# Patient Record
Sex: Male | Born: 1940 | Race: White | Hispanic: No | Smoking: Former smoker
Health system: Southern US, Community
[De-identification: ages and names within clinical notes are randomized; demographics above are authoritative.]

## PROBLEM LIST (undated history)

## (undated) DIAGNOSIS — I10 Essential (primary) hypertension: Secondary | ICD-10-CM

## (undated) DIAGNOSIS — N185 Chronic kidney disease, stage 5: Secondary | ICD-10-CM

## (undated) DIAGNOSIS — G4730 Sleep apnea, unspecified: Secondary | ICD-10-CM

## (undated) DIAGNOSIS — G459 Transient cerebral ischemic attack, unspecified: Secondary | ICD-10-CM

## (undated) DIAGNOSIS — E785 Hyperlipidemia, unspecified: Secondary | ICD-10-CM

## (undated) DIAGNOSIS — R410 Disorientation, unspecified: Secondary | ICD-10-CM

## (undated) DIAGNOSIS — F05 Delirium due to known physiological condition: Secondary | ICD-10-CM

## (undated) DIAGNOSIS — Z8489 Family history of other specified conditions: Secondary | ICD-10-CM

## (undated) DIAGNOSIS — N400 Enlarged prostate without lower urinary tract symptoms: Secondary | ICD-10-CM

## (undated) DIAGNOSIS — N189 Chronic kidney disease, unspecified: Secondary | ICD-10-CM

## (undated) DIAGNOSIS — M1990 Unspecified osteoarthritis, unspecified site: Secondary | ICD-10-CM

## (undated) DIAGNOSIS — F0390 Unspecified dementia without behavioral disturbance: Secondary | ICD-10-CM

## (undated) HISTORY — DX: Transient cerebral ischemic attack, unspecified: G45.9

## (undated) HISTORY — PX: CERVICAL DISC SURGERY: SHX588

---

## 1998-06-25 ENCOUNTER — Ambulatory Visit (HOSPITAL_BASED_OUTPATIENT_CLINIC_OR_DEPARTMENT_OTHER): Admission: RE | Admit: 1998-06-25 | Discharge: 1998-06-25 | Payer: Self-pay | Admitting: Surgery

## 2008-03-05 ENCOUNTER — Encounter (INDEPENDENT_AMBULATORY_CARE_PROVIDER_SITE_OTHER): Payer: Self-pay | Admitting: *Deleted

## 2008-03-07 ENCOUNTER — Ambulatory Visit (HOSPITAL_COMMUNITY): Admission: RE | Admit: 2008-03-07 | Discharge: 2008-03-07 | Payer: Self-pay | Admitting: Neurosurgery

## 2008-05-12 ENCOUNTER — Inpatient Hospital Stay (HOSPITAL_COMMUNITY): Admission: RE | Admit: 2008-05-12 | Discharge: 2008-05-16 | Payer: Self-pay | Admitting: Neurosurgery

## 2008-05-12 HISTORY — PX: BACK SURGERY: SHX140

## 2009-01-28 ENCOUNTER — Telehealth: Payer: Self-pay | Admitting: Gastroenterology

## 2010-02-16 NOTE — Progress Notes (Signed)
Summary: Schedule Colonoscopy  Phone Note Outgoing Call Call back at [facility] Phone [phone]   Call placed by: Bernita Buffy CMA Deborra Medina),  January 28, 2009 4:01 PM Call placed to: Patient Summary of Call: Left message on patients machine to call back.  Initial call taken by: Bernita Buffy CMA Deborra Medina),  January 28, 2009 4:01 PM  Follow-up for Phone Call        Left message on patients machine to call back.  Follow-up by: Bernita Buffy CMA Deborra Medina),  February 11, 2009 8:59 AM  Additional Follow-up for Phone Call Additional follow up Details #1::        Left message on patients machine to call back.  Additional Follow-up by: Bernita Buffy CMA ([place]),  February 18, 2009 3:59 PM

## 2010-04-28 LAB — BASIC METABOLIC PANEL
BUN: 20 mg/dL (ref 6–23)
BUN: 22 mg/dL (ref 6–23)
CO2: 24 mEq/L (ref 19–32)
CO2: 28 mEq/L (ref 19–32)
Chloride: 104 mEq/L (ref 96–112)
Chloride: 107 mEq/L (ref 96–112)
GFR calc Af Amer: >60 mL/min (ref >60)
GFR calc non Af Amer: 60 mL/min — ABNORMAL LOW (ref >60)
Glucose, Bld: 95 mg/dL (ref 70–99)
Potassium: 3.6 mEq/L (ref 3.5–5.1)
Potassium: 4.2 mEq/L (ref 3.5–5.1)
Sodium: 143 mEq/L (ref 135–145)

## 2010-04-28 LAB — DIFFERENTIAL
Basophils Absolute: 0 10*3/uL (ref 0.0–0.1)
Basophils Absolute: 0 10*3/uL (ref 0.0–0.1)
Basophils Relative: 0 % (ref 0–1)
Eosinophils Absolute: 0.3 10*3/uL (ref 0.0–0.7)
Eosinophils Relative: 4 % (ref 0–5)
Lymphocytes Relative: 11 % — ABNORMAL LOW (ref 12–46)
Lymphocytes Relative: 21 % (ref 12–46)
Lymphs Abs: 1.1 10*3/uL (ref 0.7–4.0)
Monocytes Absolute: 0.8 10*3/uL (ref 0.1–1.0)
Neutrophils Relative %: 75 % (ref 43–77)

## 2010-04-28 LAB — CBC
HCT: 26 % — ABNORMAL LOW (ref 39.0–52.0)
HCT: 31.3 % — ABNORMAL LOW (ref 39.0–52.0)
HCT: 40.4 % (ref 39.0–52.0)
Hemoglobin: 14.2 g/dL (ref 13.0–17.0)
Hemoglobin: 9.1 g/dL — ABNORMAL LOW (ref 13.0–17.0)
MCHC: 35 g/dL (ref 30.0–36.0)
MCHC: 35.1 g/dL (ref 30.0–36.0)
MCHC: 35.4 g/dL (ref 30.0–36.0)
MCV: 86.5 fL (ref 78.0–100.0)
MCV: 87.1 fL (ref 78.0–100.0)
MCV: 87.1 fL (ref 78.0–100.0)
Platelets: 140 10*3/uL — ABNORMAL LOW (ref 150–400)
Platelets: 178 10*3/uL (ref 150–400)
Platelets: 225 10*3/uL (ref 150–400)
RBC: 3.59 MIL/uL — ABNORMAL LOW (ref 4.22–5.81)
RDW: 12.5 % (ref 11.5–15.5)
RDW: 13.3 % (ref 11.5–15.5)
WBC: 10.7 10*3/uL — ABNORMAL HIGH (ref 4.0–10.5)
WBC: 9.9 10*3/uL (ref 4.0–10.5)

## 2010-04-28 LAB — URINALYSIS, ROUTINE W REFLEX MICROSCOPIC
Glucose, UA: NEGATIVE mg/dL
Specific Gravity, Urine: 1.016 (ref 1.005–1.030)
pH: 5 (ref 5.0–8.0)

## 2010-04-28 LAB — URINE MICROSCOPIC-ADD ON

## 2010-04-28 LAB — CULTURE, BLOOD (ROUTINE X 2): Culture: NO GROWTH

## 2010-04-28 LAB — ABO/RH: ABO/RH(D): A POS

## 2010-06-01 NOTE — Op Note (Signed)
NAME:  Chase Evans, Chase Evans NO.:  [id number]   MEDICAL RECORD NO.:  [id number]          PATIENT TYPE:  INP   LOCATION:  3022                         FACILITY:  [place]   PHYSICIAN:  [name], M.D.DATE OF BIRTH:  1940/06/03   DATE OF PROCEDURE:  05/12/2008  DATE OF DISCHARGE:                               OPERATIVE REPORT   BRIEF HISTORY:  The patient is a 70 year old white male who has suffered  from back and leg pain consistent with neurogenic claudication.  He has  failed medical management, worked up with a lumbar MRI, which  demonstrated the patient had spondylolisthesis and stenosis at L2-3, 3-  4, 4-5.  I discussed various treatment with the patient including  surgery.  The patient has weighed the risks, benefits, and alternatives  of surgery and decided to proceed with a lumbar decompression,  instrumentation, and fusion.   PREOPERATIVE DIAGNOSES:  L2-3, L3-4, L4-5 spondylolisthesis, spinal  stenosis, degeneration of lumbar radiculopathy lumbago.   POSTOPERATIVE DIAGNOSES:  L2-3, L3-4, L4-5 spondylolisthesis, spinal  stenosis, degeneration of lumbar radiculopathy lumbago.   PROCEDURE:  Bilateral L2, L3, and L4 laminotomies and foraminotomies to  decompress the bilateral L2, L3, and L4 nerve roots (what need to  perform is was an excessive, what was required to perform posterior  lumbar fusion); L2-3, L3-4, L4-5 posterior lumbar interbody fusion with  local morselized autograft and fused bone graft extender; insertion at  L2-3, L4-5, and L5-S1 interbody prosthesis (Capstone PEEK interbody  prosthesis); L2-L5 posterior segmental instrumentation with legacy  titanium pedicle screw rods; L2-3, L3-4, L4-5 posterolateral arthrodesis  with local morselized autograft bone and Vitoss bone graft extender.   SURGEON:  [name], MD   ASSISTANT:  Otilio Connors, MD   ANESTHESIA:  General endotracheal.   ESTIMATED BLOOD LOSS:  400 mL.   SPECIMENS:   None.   DRAINS:  None.   COMPLICATIONS:  None.   DESCRIPTION OF PROCEDURE:  The patient was brought to the operating room  by the anesthesia team.  General endotracheal anesthesia was induced.  The patient was carefully turned to the prone position on the Wilson  frame.  His lumbosacral region was then shaved with clippers and  prepared with Betadine scrub and Betadine solution.  Sterile drapes were  applied.  I then injected the area to be incised with Marcaine with  epinephrine solution, used a scalpel to make a linear midline incision  over the L2-3, 3-4, 4-5 interspace.  We then used electrocautery to  perform bilateral subperiosteal dissections exposing spinous process  lamina of L1, 2, 3, 4, and 5.  We obtained intraoperative radiograph to  confirm our location.  We then inserted the first retractor for  exposure.   Because of the patient's severe stenosis, facet arthropathy,  spondylosis, fixed foraminal stenosis, etc, a wide lateral decompression  was required in excess of that needed to do posterior lumbar interbody  fusion.  We used a high-speed drill to perform bilateral L2, L3, and L4  laminotomies.  We widened the laminotomies with Kerrison punch.  We then  removed the L2-3, 3-4, 4-5  ligamentum flavum.   We then performed wide foraminotomies about the bilateral L2, 3, 4, and  5 nerve roots.  We then palpated along the exit route of the nerve roots  and noted them  well decompressed throughout the neural foramen.  Having  completed decompression, we now turned our attention to arthrodesis.  We  incised the L2-3, 3-4, 4-5 intervertebral disk bilaterally with the #15  blade scalpel and we performed a partial intervertebral dissection with  pituitary forceps and a curette.  We prepared the vertebral endplates  for fusion by using curettes to clear soft tissue from the vertebral  endplates.  We then used trial spacers and determined using 12/26 lumbar  interbody prosthesis  at each level bilaterally.  We then prefilled these  prosthesis with a combination of local morselized autograft bone we  obtained during the decompression as well as active fused bone graft  extender.  We then inserted the prosthesis into the interspaces at L2-3,  3-4, 4-5 bilaterally.  We filled the remainder of the disk space with  combination of active fused and local morselized autograft bone, which  completed the posterior lumbar interbody fusion.   We now turned attention to the instrumentation.  Under fluoroscopic  guidance, we cannulated the bilateral L2, 3, 4, and 5 pedicles with bone  probe.  We tapped pedicles with a 6.5 mm tap.  We then probed inside the  tapped pedicles to look for cortical breaches; we inserted 7.5 x 55 mm  screws bilaterally at L2-3, 4, and 5 under fluoroscopic guidance.  We  then palpated along the medial aspect of the bilateral L2, 3, 4, and 5  pedicles to rule out cortical breeches.  There were none.  We then  connected unilateral pedicle screws with lordotic rod.  We compressed  the construct and secured rod in place with the capsule, which was  tightened appropriately.  This completed the instrumentation.   We now turned our attention to posterolateral arthrodesis.  We used a  high-speed drill to decorticate the remainder of the L2-3, 3-4, and 4-5  facets, pars, and transverse processes.  We laid a combination of local  morselized autograft bone and Vitoss bone graft extenders over these  decorticated posterolateral structures.  This completed the  posterolateral arthrodesis.   We then inspected the thecal sac at L2, 3, 4, and 5 nerve roots and  neural structure well decompressed.  We obtained hemostasis using  bipolar cautery.  We irrigated the wound out with bacitracin solution  and then removed the retractors.  We then reapproximated the patient's  thoracolumbar fascia with interrupted #1 Vicryl suture, subcutaneous  using 2-0 Vicryl suture, and  skin with Steri-Strips and Benzoin.  The  wound was then coated with bacitracin ointment.  Sterile dressing was  applied.  The drapes were removed.  The patient was subsequently  returned to supine position where he was extubated by the anesthesia  team and transported to the post anesthesia care unit in stable  condition.  All sponge, instrument, and needle counts were correct at  the end of this case.      [name], M.D.  Electronically Signed     [name], M.D.  Electronically Signed   JDJ/MEDQ  D:  05/12/2008  T:  05/13/2008  Job:  [id number]

## 2010-06-04 NOTE — Discharge Summary (Signed)
NAME:  Chase Evans, Chase Evans NO.:  [id number]   MEDICAL RECORD NO.:  [id number]          PATIENT TYPE:  INP   LOCATION:  3022                         FACILITY:  [place]   PHYSICIAN:  [name], M.D.DATE OF BIRTH:  12/29/1940   DATE OF ADMISSION:  05/12/2008  DATE OF DISCHARGE:  05/16/2008                               DISCHARGE SUMMARY   BRIEF HISTORY:  The patient is a 70 year old white male who has suffered  from back and leg pain.  He failed medical management and worked up with  a lumbar myelo CT, which demonstrated the patient had spondylolisthesis  and stenosis at L2-3, L3-4, L4-5.  I discussed various treatment options  with the patient including surgery.  The patient has weighed the risks,  benefits, and alternatives of surgery and decided to proceed with a  lumbar decompression, instrumentation, and fusion.   For further details of this admission, please refer to typed history and  physical.   HOSPITAL COURSE:  Admitted the patient to [facility] on May 12, 2008.  On the day of admission, I performed L2-3, L3-4, and L4-5  decompression, instrumentation, and fusion.  The surgery went well (for  full details of this operation, please refer to typed operative note).   POSTOPERATIVE COURSE:  The patient's postoperative course was remarkable  only for a low-grade fever.  We checked a chest x-ray, it turned out he  had some atelectasis.  His urinalysis and blood cultures were negative.  The patient by postop day #4 was afebrile, vital signs were stable, and  he was eating well, ambulating well, and was requesting discharge to  home, was therefore discharged to home on May 16, 2008.   DISCHARGE INSTRUCTIONS:  The patient was given written discharge  instructions.  Instructed to follow up with me in 4 weeks.   DISCHARGE PRESCRIPTIONS:  1. Percocet 10/325 #100 one p.o. q.4 h. p.r.n. for pain.  2. Valium 5 mg #50 one p.o. q.6 h. p.r.n. for muscle  spasms.   FINAL DIAGNOSES:  L2-3, L3-4, L4-5 degenerative spondylolisthesis,  spinal stenosis, lumbar radiculopathy, lumbago, blood loss anemia, and  urinary retention.   PROCEDURE PERFORMED:  Decompressive laminectomy at L2, L3, L4 with L2-3,  L3-4, L4-5 posterior lumbar interbody fusion and placement of the  interbody prosthesis (Capstone PEEK interbody prosthesis) posterolateral  arthrodesis at L2-3, L3-4, L4-5 and a posterior segmental  instrumentation at L2-L5 with Legacy titanium pedicle screws and rods.      [name], M.D.  Electronically Signed     [name], M.D.  Electronically Signed    JDJ/MEDQ  D:  05/29/2008  T:  05/30/2008  Job:  [id number]

## 2011-08-02 ENCOUNTER — Encounter: Payer: Self-pay | Admitting: Internal Medicine

## 2011-09-06 ENCOUNTER — Other Ambulatory Visit: Payer: Self-pay | Admitting: Neurosurgery

## 2011-09-06 ENCOUNTER — Other Ambulatory Visit (HOSPITAL_COMMUNITY): Payer: Self-pay | Admitting: Neurosurgery

## 2011-09-06 DIAGNOSIS — M48061 Spinal stenosis, lumbar region without neurogenic claudication: Secondary | ICD-10-CM

## 2011-09-13 ENCOUNTER — Ambulatory Visit (HOSPITAL_COMMUNITY)
Admission: RE | Admit: 2011-09-13 | Discharge: 2011-09-13 | Disposition: A | Discharge status: Home / Self Care | Payer: Medicare Other | Source: Ambulatory Visit | Attending: Neurosurgery | Admitting: Neurosurgery

## 2011-09-13 DIAGNOSIS — M48061 Spinal stenosis, lumbar region without neurogenic claudication: Secondary | ICD-10-CM | POA: Insufficient documentation

## 2011-09-13 DIAGNOSIS — Z981 Arthrodesis status: Secondary | ICD-10-CM | POA: Insufficient documentation

## 2011-09-13 DIAGNOSIS — M25559 Pain in unspecified hip: Secondary | ICD-10-CM | POA: Insufficient documentation

## 2011-09-13 DIAGNOSIS — M549 Dorsalgia, unspecified: Secondary | ICD-10-CM | POA: Insufficient documentation

## 2011-09-13 MED ORDER — IOHEXOL 180 MG/ML  SOLN
20.0000 mL | Freq: Once | INTRAMUSCULAR | Status: AC | PRN
  Administered 2011-09-13: 20 mL via INTRATHECAL

## 2011-09-13 MED ORDER — DIAZEPAM 5 MG PO TABS
10.0000 mg | ORAL_TABLET | Freq: Once | ORAL | Status: AC
  Administered 2011-09-13: 10 mg via ORAL

## 2011-09-13 MED ORDER — OXYCODONE-ACETAMINOPHEN 5-325 MG PO TABS
ORAL_TABLET | ORAL | Status: AC
  Filled 2011-09-13: qty 2

## 2011-09-13 MED ORDER — MORPHINE SULFATE 4 MG/ML IJ SOLN: 2.0000 mg | INTRAMUSCULAR | Status: DC | PRN

## 2011-09-13 MED ORDER — OXYCODONE-ACETAMINOPHEN 5-325 MG PO TABS
1.0000 | ORAL_TABLET | ORAL | Status: DC | PRN
  Administered 2011-09-13: 2 via ORAL

## 2011-09-13 MED ORDER — DIAZEPAM 5 MG PO TABS
ORAL_TABLET | ORAL | Status: AC
  Administered 2011-09-13: 10 mg via ORAL
  Filled 2011-09-13: qty 2

## 2011-09-13 MED ORDER — ONDANSETRON HCL 4 MG/2ML IJ SOLN: 4.0000 mg | Freq: Four times a day (QID) | INTRAMUSCULAR | Status: DC | PRN

## 2011-09-13 MED ORDER — DIAZEPAM 5 MG PO TABS: 10.0000 mg | ORAL_TABLET | Freq: Once | ORAL | Status: DC

## 2011-09-13 MED ORDER — OXYCODONE HCL 5 MG PO TABS
ORAL_TABLET | ORAL | Status: AC
  Filled 2011-09-13: qty 2

## 2011-09-13 NOTE — Op Note (Signed)
Brief history: The patient complains of back pain.  Procedure: Lumbar myelogram  Surgeon: Dr. Earle Gell  Asst.: None  Level injected:L4/5  Dye injected: 15 cc of Omnipaque 180  CSF appearance: Clear  Estimated blood loss minimal  Complications: None

## 2011-09-13 NOTE — H&P (Signed)
Subjective: The patient is a 71 year old white male who I previously performed a lumbar decompression and fusion on. The patient has developed recurrent back buttocks and leg pain consistent with neurogenic claudication. I discussed working up further with a lumbar mild CT. The patient has weighed the risks, benefits, and alternatives to surgery and decided proceed with that study.   No past medical history on file.  No past surgical history on file.  Allergies  Allergen Reactions  . Penicillins Hives    History  Substance Use Topics  . Smoking status: Not on file  . Smokeless tobacco: Not on file  . Alcohol Use: Not on file    No family history on file. Prior to Admission medications   Medication Sig Start Date End Date Taking? Authorizing Provider  aspirin 81 MG tablet Take 81 mg by mouth daily.   Yes Historical Provider, MD  gabapentin (NEURONTIN) 800 MG tablet Take 800 mg by mouth 3 (three) times daily.   Yes Historical Provider, MD  ibuprofen (ADVIL,MOTRIN) 200 MG tablet Take 400 mg by mouth every 8 (eight) hours as needed. For pain   Yes Historical Provider, MD  rosuvastatin (CRESTOR) 10 MG tablet Take 10 mg by mouth daily.   Yes Historical Provider, MD  valsartan-hydrochlorothiazide (DIOVAN-HCT) 160-12.5 MG per tablet Take 1 tablet by mouth daily.   Yes Historical Provider, MD     Review of Systems  Positive ROS: As above  All other systems have been reviewed and were otherwise negative with the exception of those mentioned in the HPI and as above.  Objective: Vital signs in last 24 hours: Temp:  [97.7 F (36.5 C)] 97.7 F (36.5 C) (08/27 0609) Pulse Rate:  [75] 75  (08/27 0609) Resp:  [18] 18  (08/27 0609) BP: (143)/(78) 143/78 mmHg (08/27 0609) SpO2:  [97 %] 97 % (08/27 0609) Weight:  [94.348 kg (208 lb)] 94.348 kg (208 lb) (08/27 0609)  General Appearance: Alert, cooperative, no distress, appears stated age Head: Normocephalic, without obvious abnormality,  atraumatic Eyes: PERRL, conjunctiva/corneas clear, EOM's intact, fundi benign, both eyes      Ears: Normal TM's and external ear canals, both ears Throat: Lips, mucosa, and tongue normal; teeth and gums normal Neck: Supple, symmetrical, trachea midline, no adenopathy; thyroid: No enlargement/tenderness/nodules; no carotid bruit or JVD Back: Symmetric, no curvature, ROM normal, no CVA tenderness the patient's lumbar incision is well-healed Lungs: Clear to auscultation bilaterally, respirations unlabored Heart: Regular rate and rhythm, S1 and S2 normal, no murmur, rub or gallop Abdomen: Soft, non-tender, bowel sounds active all four quadrants, no masses, no organomegaly Extremities: Extremities normal, atraumatic, no cyanosis or edema Pulses: 2+ and symmetric all extremities Skin: Skin color, texture, turgor normal, no rashes or lesions  NEUROLOGIC:   Mental status: alert and oriented, no aphasia, good attention span, Fund of knowledge/ memory ok Motor Exam - grossly normal Sensory Exam - grossly normal Reflexes:  Coordination - grossly normal Gait - grossly normal Balance - grossly normal Cranial Nerves: I: smell Not tested  II: visual acuity  OS: Normal    OD: Normal   II: visual fields Full to confrontation  II: pupils Equal, round, reactive to light  III,VII: ptosis None  III,IV,VI: extraocular muscles  Full ROM  V: mastication Normal  V: facial light touch sensation  Normal  V,VII: corneal reflex  Present  VII: facial muscle function - upper  Normal  VII: facial muscle function - lower Normal  VIII: hearing Not tested  IX:  soft palate elevation  Normal  IX,X: gag reflex Present  XI: trapezius strength  5/5  XI: sternocleidomastoid strength 5/5  XI: neck flexion strength  5/5  XII: tongue strength  Normal    Data Review Lab Results  Component Value Date   WBC 7.5 05/16/2008   HGB 9.1* 05/16/2008   HCT 26.0* 05/16/2008   MCV 87.4 05/16/2008   PLT 178 05/16/2008   Lab  Results  Component Value Date   NA 135 05/13/2008   K 3.6 05/13/2008   CL 104 05/13/2008   CO2 24 05/13/2008   BUN 20 05/13/2008   CREATININE 1.26 05/13/2008   GLUCOSE 108* 05/13/2008   No results found for this basename: INR, PROTIME    Assessment/Plan: Lumbago, lumbar discopathy, neurogenic claudication: I discussed situation with the patient. We have discussed working up him up further with a lumbar mild CT. The patient has weighed the risks, benefits, and alternatives to this study and decided to proceed with the study.   Chase Evans D 09/13/2011 7:36 AM

## 2011-09-14 ENCOUNTER — Telehealth (HOSPITAL_COMMUNITY): Payer: Self-pay | Admitting: *Deleted

## 2011-09-14 NOTE — Telephone Encounter (Signed)
Post myelo phone call attempted.  Message left.

## 2011-09-29 ENCOUNTER — Other Ambulatory Visit: Payer: Self-pay | Admitting: Neurosurgery

## 2011-10-18 ENCOUNTER — Encounter (HOSPITAL_COMMUNITY): Payer: Self-pay | Admitting: *Deleted

## 2011-10-18 ENCOUNTER — Encounter (HOSPITAL_COMMUNITY): Payer: Self-pay | Admitting: Pharmacy Technician

## 2011-10-18 MED ORDER — VANCOMYCIN HCL IN DEXTROSE 1-5 GM/200ML-% IV SOLN
1000.0000 mg | INTRAVENOUS | Status: AC
  Administered 2011-10-19: 1000 mg via INTRAVENOUS
  Filled 2011-10-18: qty 200

## 2011-10-19 ENCOUNTER — Inpatient Hospital Stay (HOSPITAL_COMMUNITY): Payer: Medicare Other

## 2011-10-19 ENCOUNTER — Inpatient Hospital Stay (HOSPITAL_COMMUNITY)
Admission: RE | Admit: 2011-10-19 | Discharge: 2011-10-24 | DRG: 460 | Disposition: A | Discharge status: Home Health | Payer: Medicare Other | Source: Ambulatory Visit | Attending: Neurosurgery | Admitting: Neurosurgery

## 2011-10-19 ENCOUNTER — Inpatient Hospital Stay (HOSPITAL_COMMUNITY): Payer: Medicare Other | Admitting: *Deleted

## 2011-10-19 ENCOUNTER — Encounter (HOSPITAL_COMMUNITY)
Admission: RE | Disposition: A | Discharge status: Home Health | Payer: Self-pay | Source: Ambulatory Visit | Attending: Neurosurgery

## 2011-10-19 ENCOUNTER — Encounter (HOSPITAL_COMMUNITY): Payer: Self-pay | Admitting: *Deleted

## 2011-10-19 DIAGNOSIS — Z87891 Personal history of nicotine dependence: Secondary | ICD-10-CM

## 2011-10-19 DIAGNOSIS — E785 Hyperlipidemia, unspecified: Secondary | ICD-10-CM | POA: Diagnosis present

## 2011-10-19 DIAGNOSIS — R339 Retention of urine, unspecified: Secondary | ICD-10-CM | POA: Diagnosis not present

## 2011-10-19 DIAGNOSIS — Z888 Allergy status to other drugs, medicaments and biological substances status: Secondary | ICD-10-CM

## 2011-10-19 DIAGNOSIS — Z79899 Other long term (current) drug therapy: Secondary | ICD-10-CM

## 2011-10-19 DIAGNOSIS — Z880 Allergy status to penicillin: Secondary | ICD-10-CM

## 2011-10-19 DIAGNOSIS — Q762 Congenital spondylolisthesis: Secondary | ICD-10-CM

## 2011-10-19 DIAGNOSIS — R509 Fever, unspecified: Secondary | ICD-10-CM | POA: Diagnosis not present

## 2011-10-19 DIAGNOSIS — Z981 Arthrodesis status: Secondary | ICD-10-CM

## 2011-10-19 DIAGNOSIS — M51379 Other intervertebral disc degeneration, lumbosacral region without mention of lumbar back pain or lower extremity pain: Principal | ICD-10-CM | POA: Diagnosis present

## 2011-10-19 DIAGNOSIS — M48061 Spinal stenosis, lumbar region without neurogenic claudication: Secondary | ICD-10-CM

## 2011-10-19 DIAGNOSIS — I10 Essential (primary) hypertension: Secondary | ICD-10-CM | POA: Diagnosis present

## 2011-10-19 DIAGNOSIS — Z7982 Long term (current) use of aspirin: Secondary | ICD-10-CM

## 2011-10-19 DIAGNOSIS — M5137 Other intervertebral disc degeneration, lumbosacral region: Principal | ICD-10-CM | POA: Diagnosis present

## 2011-10-19 HISTORY — DX: Unspecified osteoarthritis, unspecified site: M19.90

## 2011-10-19 HISTORY — DX: Benign prostatic hyperplasia without lower urinary tract symptoms: N40.0

## 2011-10-19 HISTORY — DX: Family history of other specified conditions: Z84.89

## 2011-10-19 HISTORY — DX: Essential (primary) hypertension: I10

## 2011-10-19 HISTORY — DX: Hyperlipidemia, unspecified: E78.5

## 2011-10-19 LAB — CBC
HCT: 40.7 % (ref 39.0–52.0)
MCHC: 33.9 g/dL (ref 30.0–36.0)
MCV: 87.5 fL (ref 78.0–100.0)
Platelets: 162 10*3/uL (ref 150–400)
RDW: 13 % (ref 11.5–15.5)
WBC: 6.1 10*3/uL (ref 4.0–10.5)

## 2011-10-19 LAB — BASIC METABOLIC PANEL
BUN: 29 mg/dL — ABNORMAL HIGH (ref 6–23)
CO2: 23 mEq/L (ref 19–32)
Calcium: 9.2 mg/dL (ref 8.4–10.5)
Chloride: 106 mEq/L (ref 96–112)
Creatinine, Ser: 1.41 mg/dL — ABNORMAL HIGH (ref 0.50–1.35)

## 2011-10-19 LAB — TYPE AND SCREEN

## 2011-10-19 LAB — SURGICAL PCR SCREEN
MRSA, PCR: NEGATIVE
Staphylococcus aureus: NEGATIVE

## 2011-10-19 SURGERY — POSTERIOR LUMBAR FUSION 1 WITH HARDWARE REMOVAL
Anesthesia: General | Site: Spine Lumbar | Wound class: Clean

## 2011-10-19 MED ORDER — SODIUM CHLORIDE 0.9 % IJ SOLN: 3.0000 mL | INTRAMUSCULAR | Status: DC | PRN

## 2011-10-19 MED ORDER — IRBESARTAN 150 MG PO TABS
150.0000 mg | ORAL_TABLET | Freq: Every day | ORAL | Status: DC
  Administered 2011-10-19 – 2011-10-24 (×6): 150 mg via ORAL
  Filled 2011-10-19 (×6): qty 1

## 2011-10-19 MED ORDER — EPHEDRINE SULFATE 50 MG/ML IJ SOLN
INTRAMUSCULAR | Status: DC | PRN
  Administered 2011-10-19: 5 mg via INTRAVENOUS
  Administered 2011-10-19: 10 mg via INTRAVENOUS
  Administered 2011-10-19 (×5): 5 mg via INTRAVENOUS

## 2011-10-19 MED ORDER — DIAZEPAM 5 MG PO TABS
5.0000 mg | ORAL_TABLET | Freq: Four times a day (QID) | ORAL | Status: DC | PRN
  Administered 2011-10-19 – 2011-10-22 (×4): 5 mg via ORAL
  Filled 2011-10-19 (×5): qty 1

## 2011-10-19 MED ORDER — DIPHENHYDRAMINE HCL 12.5 MG/5ML PO ELIX
12.5000 mg | ORAL_SOLUTION | Freq: Four times a day (QID) | ORAL | Status: DC | PRN
  Filled 2011-10-19: qty 5

## 2011-10-19 MED ORDER — SUCCINYLCHOLINE CHLORIDE 20 MG/ML IJ SOLN
INTRAMUSCULAR | Status: DC | PRN
  Administered 2011-10-19: 100 mg via INTRAVENOUS

## 2011-10-19 MED ORDER — MORPHINE SULFATE (PF) 1 MG/ML IV SOLN
INTRAVENOUS | Status: AC
  Administered 2011-10-19: 13:00:00
  Filled 2011-10-19: qty 25

## 2011-10-19 MED ORDER — ZOLPIDEM TARTRATE 5 MG PO TABS: 5.0000 mg | ORAL_TABLET | Freq: Every evening | ORAL | Status: DC | PRN

## 2011-10-19 MED ORDER — VANCOMYCIN HCL IN DEXTROSE 1-5 GM/200ML-% IV SOLN
1000.0000 mg | Freq: Once | INTRAVENOUS | Status: DC
  Filled 2011-10-19: qty 200

## 2011-10-19 MED ORDER — SODIUM CHLORIDE 0.9 % IV SOLN
INTRAVENOUS | Status: AC
  Filled 2011-10-19: qty 500

## 2011-10-19 MED ORDER — VECURONIUM BROMIDE 10 MG IV SOLR
INTRAVENOUS | Status: DC | PRN
  Administered 2011-10-19: 1 mg via INTRAVENOUS
  Administered 2011-10-19: 4 mg via INTRAVENOUS

## 2011-10-19 MED ORDER — BUPIVACAINE LIPOSOME 1.3 % IJ SUSP
20.0000 mL | Freq: Once | INTRAMUSCULAR | Status: DC
  Filled 2011-10-19: qty 20

## 2011-10-19 MED ORDER — ONDANSETRON HCL 4 MG/2ML IJ SOLN
INTRAMUSCULAR | Status: DC | PRN
  Administered 2011-10-19: 4 mg via INTRAVENOUS

## 2011-10-19 MED ORDER — ROCURONIUM BROMIDE 100 MG/10ML IV SOLN
INTRAVENOUS | Status: DC | PRN
  Administered 2011-10-19: 50 mg via INTRAVENOUS

## 2011-10-19 MED ORDER — HYDROMORPHONE HCL PF 1 MG/ML IJ SOLN: 0.2500 mg | INTRAMUSCULAR | Status: DC | PRN

## 2011-10-19 MED ORDER — ONDANSETRON HCL 4 MG/2ML IJ SOLN: 4.0000 mg | Freq: Four times a day (QID) | INTRAMUSCULAR | Status: DC | PRN

## 2011-10-19 MED ORDER — LIDOCAINE HCL (CARDIAC) 20 MG/ML IV SOLN
INTRAVENOUS | Status: DC | PRN
  Administered 2011-10-19: 100 mg via INTRAVENOUS

## 2011-10-19 MED ORDER — MIDAZOLAM HCL 5 MG/5ML IJ SOLN
INTRAMUSCULAR | Status: DC | PRN
  Administered 2011-10-19: 2 mg via INTRAVENOUS

## 2011-10-19 MED ORDER — SODIUM CHLORIDE 0.9 % IR SOLN
Status: DC | PRN
  Administered 2011-10-19: 10:00:00

## 2011-10-19 MED ORDER — NALOXONE HCL 0.4 MG/ML IJ SOLN: 0.4000 mg | INTRAMUSCULAR | Status: DC | PRN

## 2011-10-19 MED ORDER — MORPHINE SULFATE (PF) 1 MG/ML IV SOLN: INTRAVENOUS | Status: DC

## 2011-10-19 MED ORDER — VALSARTAN-HYDROCHLOROTHIAZIDE 160-12.5 MG PO TABS: 1.0000 | ORAL_TABLET | Freq: Every day | ORAL | Status: DC

## 2011-10-19 MED ORDER — BACITRACIN 50000 UNITS IM SOLR
INTRAMUSCULAR | Status: AC
  Filled 2011-10-19: qty 1

## 2011-10-19 MED ORDER — PROPOFOL 10 MG/ML IV BOLUS
INTRAVENOUS | Status: DC | PRN
  Administered 2011-10-19 (×2): 100 mg via INTRAVENOUS

## 2011-10-19 MED ORDER — DIPHENHYDRAMINE HCL 12.5 MG/5ML PO ELIX: 12.5000 mg | ORAL_SOLUTION | Freq: Four times a day (QID) | ORAL | Status: DC | PRN

## 2011-10-19 MED ORDER — DIPHENHYDRAMINE HCL 50 MG/ML IJ SOLN: 12.5000 mg | Freq: Four times a day (QID) | INTRAMUSCULAR | Status: DC | PRN

## 2011-10-19 MED ORDER — MORPHINE SULFATE (PF) 1 MG/ML IV SOLN
INTRAVENOUS | Status: DC
  Administered 2011-10-19: 4.34 mg via INTRAVENOUS
  Administered 2011-10-20: 1.5 mg via INTRAVENOUS
  Administered 2011-10-20 (×2): 1 mg via INTRAVENOUS
  Administered 2011-10-20 (×2): 1.5 mg via INTRAVENOUS
  Administered 2011-10-20: 4.5 mg via INTRAVENOUS
  Administered 2011-10-21: 3 mg via INTRAVENOUS
  Administered 2011-10-21: 03:00:00 via INTRAVENOUS
  Administered 2011-10-21: 6 mg via INTRAVENOUS
  Filled 2011-10-19: qty 25

## 2011-10-19 MED ORDER — DOCUSATE SODIUM 100 MG PO CAPS
100.0000 mg | ORAL_CAPSULE | Freq: Two times a day (BID) | ORAL | Status: DC
  Administered 2011-10-19 – 2011-10-24 (×10): 100 mg via ORAL
  Filled 2011-10-19 (×9): qty 1

## 2011-10-19 MED ORDER — LACTATED RINGERS IV SOLN
INTRAVENOUS | Status: DC
  Administered 2011-10-19 – 2011-10-20 (×3): via INTRAVENOUS

## 2011-10-19 MED ORDER — DEXTROSE 5 % IV SOLN
INTRAVENOUS | Status: DC | PRN
  Administered 2011-10-19: 08:00:00 via INTRAVENOUS

## 2011-10-19 MED ORDER — ACETAMINOPHEN 10 MG/ML IV SOLN
1000.0000 mg | Freq: Four times a day (QID) | INTRAVENOUS | Status: AC
  Administered 2011-10-19 – 2011-10-20 (×3): 1000 mg via INTRAVENOUS
  Filled 2011-10-19 (×6): qty 100

## 2011-10-19 MED ORDER — FENTANYL CITRATE 0.05 MG/ML IJ SOLN
INTRAMUSCULAR | Status: DC | PRN
  Administered 2011-10-19 (×2): 50 ug via INTRAVENOUS
  Administered 2011-10-19: 150 ug via INTRAVENOUS
  Administered 2011-10-19: 50 ug via INTRAVENOUS

## 2011-10-19 MED ORDER — NEOSTIGMINE METHYLSULFATE 1 MG/ML IJ SOLN
INTRAMUSCULAR | Status: DC | PRN
  Administered 2011-10-19: 1 mg via INTRAVENOUS
  Administered 2011-10-19: 4 mg via INTRAVENOUS

## 2011-10-19 MED ORDER — HYDROCHLOROTHIAZIDE 12.5 MG PO CAPS
12.5000 mg | ORAL_CAPSULE | Freq: Every day | ORAL | Status: DC
  Administered 2011-10-19 – 2011-10-24 (×6): 12.5 mg via ORAL
  Filled 2011-10-19 (×6): qty 1

## 2011-10-19 MED ORDER — METOCLOPRAMIDE HCL 5 MG/ML IJ SOLN: 10.0000 mg | Freq: Once | INTRAMUSCULAR | Status: DC | PRN

## 2011-10-19 MED ORDER — BACITRACIN ZINC 500 UNIT/GM EX OINT
TOPICAL_OINTMENT | CUTANEOUS | Status: DC | PRN
  Administered 2011-10-19: 1 via TOPICAL

## 2011-10-19 MED ORDER — PHENOL 1.4 % MT LIQD: 1.0000 | OROMUCOSAL | Status: DC | PRN

## 2011-10-19 MED ORDER — THROMBIN 20000 UNITS EX SOLR
CUTANEOUS | Status: DC | PRN
  Administered 2011-10-19: 10:00:00 via TOPICAL

## 2011-10-19 MED ORDER — 0.9 % SODIUM CHLORIDE (POUR BTL) OPTIME
TOPICAL | Status: DC | PRN
  Administered 2011-10-19: 1000 mL

## 2011-10-19 MED ORDER — SODIUM CHLORIDE 0.9 % IJ SOLN
3.0000 mL | Freq: Two times a day (BID) | INTRAMUSCULAR | Status: DC
  Administered 2011-10-20: 3 mL via INTRAVENOUS

## 2011-10-19 MED ORDER — SODIUM CHLORIDE 0.9 % IJ SOLN: 9.0000 mL | INTRAMUSCULAR | Status: DC | PRN

## 2011-10-19 MED ORDER — GLYCOPYRROLATE 0.2 MG/ML IJ SOLN
INTRAMUSCULAR | Status: DC | PRN
  Administered 2011-10-19: .1 mg via INTRAVENOUS
  Administered 2011-10-19: .6 mg via INTRAVENOUS

## 2011-10-19 MED ORDER — ONDANSETRON HCL 4 MG/2ML IJ SOLN: 4.0000 mg | INTRAMUSCULAR | Status: DC | PRN

## 2011-10-19 MED ORDER — MENTHOL 3 MG MT LOZG
1.0000 | LOZENGE | OROMUCOSAL | Status: DC | PRN
  Administered 2011-10-19: 3 mg via ORAL
  Filled 2011-10-19: qty 9

## 2011-10-19 MED ORDER — MUPIROCIN 2 % EX OINT
TOPICAL_OINTMENT | Freq: Two times a day (BID) | CUTANEOUS | Status: DC
  Administered 2011-10-19: 1 via NASAL
  Administered 2011-10-20 – 2011-10-21 (×3): via NASAL
  Filled 2011-10-19 (×2): qty 22

## 2011-10-19 MED ORDER — GABAPENTIN 800 MG PO TABS
800.0000 mg | ORAL_TABLET | Freq: Three times a day (TID) | ORAL | Status: DC
  Administered 2011-10-19 – 2011-10-24 (×14): 800 mg via ORAL
  Filled 2011-10-19 (×16): qty 1

## 2011-10-19 MED ORDER — SODIUM CHLORIDE 0.9 % IV SOLN: 250.0000 mL | INTRAVENOUS | Status: DC

## 2011-10-19 MED ORDER — BUPIVACAINE-EPINEPHRINE PF 0.5-1:200000 % IJ SOLN
INTRAMUSCULAR | Status: DC | PRN
  Administered 2011-10-19: 10 mL

## 2011-10-19 MED ORDER — LACTATED RINGERS IV SOLN
INTRAVENOUS | Status: DC | PRN
  Administered 2011-10-19 (×3): via INTRAVENOUS

## 2011-10-19 MED ORDER — VANCOMYCIN HCL 1000 MG IV SOLR
750.0000 mg | Freq: Once | INTRAVENOUS | Status: AC
  Administered 2011-10-19: 750 mg via INTRAVENOUS
  Filled 2011-10-19: qty 750

## 2011-10-19 MED ORDER — OXYCODONE-ACETAMINOPHEN 5-325 MG PO TABS: 1.0000 | ORAL_TABLET | ORAL | Status: DC | PRN

## 2011-10-19 MED ORDER — BUPIVACAINE LIPOSOME 1.3 % IJ SUSP
INTRAMUSCULAR | Status: DC | PRN
  Administered 2011-10-19: 20 mL

## 2011-10-19 SURGICAL SUPPLY — 75 items
APL SKNCLS STERI-STRIP NONHPOA (GAUZE/BANDAGES/DRESSINGS) ×1
BAG DECANTER FOR FLEXI CONT (MISCELLANEOUS) ×2 IMPLANT
BENZOIN TINCTURE PRP APPL 2/3 (GAUZE/BANDAGES/DRESSINGS) ×2 IMPLANT
BLADE SURG ROTATE 9660 (MISCELLANEOUS) ×1 IMPLANT
BRUSH SCRUB EZ PLAIN DRY (MISCELLANEOUS) ×2 IMPLANT
BUR ACORN 6.0 (BURR) ×2 IMPLANT
BUR MATCHSTICK NEURO 3.0 LAGG (BURR) ×2 IMPLANT
CANISTER SUCTION 2500CC (MISCELLANEOUS) ×2 IMPLANT
CAP REVERE LOCKING (Cap) ×2 IMPLANT
CLAMP CONNECTOR 6.5-6.5MM (Clamp) ×1 IMPLANT
CLAMP PARALLEL CONNECTOR WIDE (Clamp) ×1 IMPLANT
CLOTH BEACON ORANGE TIMEOUT ST (SAFETY) ×2 IMPLANT
CONT SPEC 4OZ CLIKSEAL STRL BL (MISCELLANEOUS) ×3 IMPLANT
COVER BACK TABLE 24X17X13 BIG (DRAPES) ×1 IMPLANT
DRAPE C-ARM 42X72 X-RAY (DRAPES) ×4 IMPLANT
DRAPE LAPAROTOMY 100X72X124 (DRAPES) ×2 IMPLANT
DRAPE POUCH INSTRU U-SHP 10X18 (DRAPES) ×2 IMPLANT
DRAPE PROXIMA HALF (DRAPES) ×2 IMPLANT
DRAPE SURG 17X23 STRL (DRAPES) ×8 IMPLANT
ELECT BLADE 4.0 EZ CLEAN MEGAD (MISCELLANEOUS) ×2
ELECT REM PT RETURN 9FT ADLT (ELECTROSURGICAL) ×2
ELECTRODE BLDE 4.0 EZ CLN MEGD (MISCELLANEOUS) ×1 IMPLANT
ELECTRODE REM PT RTRN 9FT ADLT (ELECTROSURGICAL) ×1 IMPLANT
GAUZE SPONGE 4X4 16PLY XRAY LF (GAUZE/BANDAGES/DRESSINGS) ×2 IMPLANT
GLOVE BIO SURGEON STRL SZ7 (GLOVE) ×1 IMPLANT
GLOVE BIO SURGEON STRL SZ8 (GLOVE) ×1 IMPLANT
GLOVE BIO SURGEON STRL SZ8.5 (GLOVE) ×5 IMPLANT
GLOVE BIOGEL PI IND STRL 7.0 (GLOVE) IMPLANT
GLOVE BIOGEL PI IND STRL 7.5 (GLOVE) IMPLANT
GLOVE BIOGEL PI INDICATOR 7.0 (GLOVE) ×3
GLOVE BIOGEL PI INDICATOR 7.5 (GLOVE) ×1
GLOVE EXAM NITRILE LRG STRL (GLOVE) IMPLANT
GLOVE EXAM NITRILE MD LF STRL (GLOVE) IMPLANT
GLOVE EXAM NITRILE XL STR (GLOVE) IMPLANT
GLOVE EXAM NITRILE XS STR PU (GLOVE) IMPLANT
GLOVE INDICATOR 8.5 STRL (GLOVE) ×1 IMPLANT
GLOVE SS BIOGEL STRL SZ 8 (GLOVE) ×2 IMPLANT
GLOVE SUPERSENSE BIOGEL SZ 8 (GLOVE) ×3
GLOVE SURG SS PI 6.5 STRL IVOR (GLOVE) ×2 IMPLANT
GOWN BRE IMP SLV AUR LG STRL (GOWN DISPOSABLE) ×4 IMPLANT
GOWN BRE IMP SLV AUR XL STRL (GOWN DISPOSABLE) ×5 IMPLANT
GOWN STRL REIN 2XL LVL4 (GOWN DISPOSABLE) IMPLANT
KIT BASIN OR (CUSTOM PROCEDURE TRAY) ×2 IMPLANT
KIT ROOM TURNOVER OR (KITS) ×2 IMPLANT
MILL MEDIUM DISP (BLADE) ×1 IMPLANT
NDL HYPO 21X1.5 SAFETY (NEEDLE) IMPLANT
NDL HYPO 25X1 1.5 SAFETY (NEEDLE) ×1 IMPLANT
NEEDLE HYPO 21X1.5 SAFETY (NEEDLE) ×2 IMPLANT
NEEDLE HYPO 25X1 1.5 SAFETY (NEEDLE) ×2 IMPLANT
NS IRRIG 1000ML POUR BTL (IV SOLUTION) ×2 IMPLANT
PACK FOAM VITOSS 10CC (Orthopedic Implant) ×1 IMPLANT
PACK LAMINECTOMY NEURO (CUSTOM PROCEDURE TRAY) ×2 IMPLANT
PAD ARMBOARD 7.5X6 YLW CONV (MISCELLANEOUS) ×6 IMPLANT
PATTIES SURGICAL .5 X1 (DISPOSABLE) IMPLANT
PATTIES SURGICAL 1X1 (DISPOSABLE) ×1 IMPLANT
PUTTY 10ML ACTIFUSE ABX (Putty) ×1 IMPLANT
ROD REVERE CURVED 65MM (Rod) ×2 IMPLANT
SCREW 7.5X50MM (Screw) ×2 IMPLANT
SPACER SUSTAIN O 10X26 12MM (Spacer) ×2 IMPLANT
SPONGE GAUZE 4X4 12PLY (GAUZE/BANDAGES/DRESSINGS) ×2 IMPLANT
SPONGE LAP 4X18 X RAY DECT (DISPOSABLE) IMPLANT
SPONGE NEURO XRAY DETECT 1X3 (DISPOSABLE) IMPLANT
SPONGE SURGIFOAM ABS GEL 100 (HEMOSTASIS) ×2 IMPLANT
STRIP CLOSURE SKIN 1/2X4 (GAUZE/BANDAGES/DRESSINGS) ×2 IMPLANT
SUT BONE WAX W31G (SUTURE) ×1 IMPLANT
SUT VIC AB 1 CT1 18XBRD ANBCTR (SUTURE) ×2 IMPLANT
SUT VIC AB 1 CT1 8-18 (SUTURE) ×4
SUT VIC AB 2-0 CP2 18 (SUTURE) ×4 IMPLANT
SYR 20CC LL (SYRINGE) ×1 IMPLANT
SYR 20ML ECCENTRIC (SYRINGE) ×2 IMPLANT
TAPE CLOTH SURG 4X10 WHT LF (GAUZE/BANDAGES/DRESSINGS) ×1 IMPLANT
TOWEL OR 17X24 6PK STRL BLUE (TOWEL DISPOSABLE) ×2 IMPLANT
TOWEL OR 17X26 10 PK STRL BLUE (TOWEL DISPOSABLE) ×2 IMPLANT
TRAY FOLEY CATH 14FRSI W/METER (CATHETERS) ×2 IMPLANT
WATER STERILE IRR 1000ML POUR (IV SOLUTION) ×2 IMPLANT

## 2011-10-19 NOTE — Progress Notes (Signed)
ANTIBIOTIC CONSULT NOTE - INITIAL  Pharmacy Consult for Vancomycin Indication: surgical prophylaxis  Allergies  Allergen Reactions  . Crestor (Rosuvastatin) Other (See Comments)    Muscle pain  . Penicillins Hives  . Percocet (Oxycodone-Acetaminophen) Other (See Comments)    hallucinations   Assessment: 39 YOM s/p posterior lumbar fusion, 1 dose of vancomycin was given pre-op at 0820. Scr 1.44, est. GFR ~ 50 ml/min, pt. Doesn't have a drain.   Goal of Therapy:  Prevent post-op infection  Plan:  - Vancomycin 750 mg IV x 1 at 2030 - Pharmacy sign off  Maryanna Shape, PharmD, BCPS  Clinical Pharmacist  Pager: [phone]  10/19/2011,5:21 PM

## 2011-10-19 NOTE — Preoperative (Signed)
Beta Blockers   Reason not to administer Beta Blockers:Not Applicable 

## 2011-10-19 NOTE — Anesthesia Postprocedure Evaluation (Signed)
Anesthesia Post Note  Patient: Chase Evans  Procedure(s) Performed: Procedure(s) (LRB): POSTERIOR LUMBAR FUSION 1 WITH HARDWARE REMOVAL (N/A)  Anesthesia type: general  Patient location: PACU  Post pain: Pain level controlled  Post assessment: Patient's Cardiovascular Status Stable  Last Vitals:  Filed Vitals:   10/19/11 1400  BP: 164/71  Pulse: 64  Temp:   Resp: 10    Post vital signs: Reviewed and stable  Level of consciousness: sedated  Complications: No apparent anesthesia complications

## 2011-10-19 NOTE — Progress Notes (Signed)
Subjective:  The patient is alert and pleasant. He looks well.  Objective: Vital signs in last 24 hours: Temp:  [96.8 F (36 C)-98.1 F (36.7 C)] 97.6 F (36.4 C) (10/02 1655) Pulse Rate:  [64-91] 81  (10/02 1655) Resp:  [10-20] 18  (10/02 1655) BP: (135-167)/(51-85) 163/85 mmHg (10/02 1655) SpO2:  [95 %-100 %] 98 % (10/02 1655)  Intake/Output from previous day:   Intake/Output this shift: Total I/O In: 3000 [I.V.:2600; Other:400] Out: 525 [Urine:300; Blood:225]  Physical exam the patient is moving all 4 extremities well.  Lab Results:  [facility] 10/19/11 0656  WBC 6.1  HGB 13.8  HCT 40.7  PLT 162   BMET  Basename 10/19/11 0656  NA 141  K 4.2  CL 106  CO2 23  GLUCOSE 109*  BUN 29*  CREATININE 1.41*  CALCIUM 9.2    Studies/Results: Dg Chest 2 View  10/19/2011  *RADIOLOGY REPORT*  Clinical Data: Preop for laminectomy.  CHEST - 2 VIEW  Comparison: Two-view chest 05/14/2008.  Findings: The heart size is normal.  The lungs are clear.  The visualized soft tissues and bony thorax are unremarkable.  IMPRESSION: Negative chest.   Original Report Authenticated By: Resa Miner. MATTERN, M.D.    Dg Lumbar Spine 2-3 Views  10/19/2011  *RADIOLOGY REPORT*  Clinical Data: Intraoperative spot radiographs during posterior extension of lumbar interbody fusion to include the L5-S1 level  DG C-ARM 1-60 MIN,LUMBAR SPINE - 2-3 VIEW  Technique: AP and cross-table lateral spot images obtained at the time of surgery.  Comparison:  Radiographs 09/13/2011  Findings: Frontal and cross-table lateral intraoperative spot images demonstrate interval placement of bilateral S1 pedicle screws and interbody graft at L5-S1 incompletely imaged L2-L5 posterior fixation hardware appears intact.  No immediate hardware complication. Alignment appears similar to prior.  IMPRESSION:  Extension of L2-L5 PLIF to include the L5-S1 level as detailed above.   Original Report Authenticated By: Juanito Doom C-arm  1-60 Min  10/19/2011  *RADIOLOGY REPORT*  Clinical Data: Intraoperative spot radiographs during posterior extension of lumbar interbody fusion to include the L5-S1 level  DG C-ARM 1-60 MIN,LUMBAR SPINE - 2-3 VIEW  Technique: AP and cross-table lateral spot images obtained at the time of surgery.  Comparison:  Radiographs 09/13/2011  Findings: Frontal and cross-table lateral intraoperative spot images demonstrate interval placement of bilateral S1 pedicle screws and interbody graft at L5-S1 incompletely imaged L2-L5 posterior fixation hardware appears intact.  No immediate hardware complication. Alignment appears similar to prior.  IMPRESSION:  Extension of L2-L5 PLIF to include the L5-S1 level as detailed above.   Original Report Authenticated By: Myrle Sheng     Assessment/Plan: The patient is doing well.  LOS: 0 days     Reg Bircher D 10/19/2011, 5:23 PM

## 2011-10-19 NOTE — H&P (Signed)
Subjective: The patient is a 71 year old white male who I performed a [id number] decompression and fusion on years ago. He has taken a fall and developed recurrent back buttock and leg pain. He failed medical management was worked up with lumbar myelo CT. This demonstrated she had developed a spondylolisthesis and severe stenosis at L5-S1. I discussed the various treatment options with the patient including surgery. The patient has weighed the risks, benefits, and alternatives surgery decided proceed with a exploration was lumbar fusion as well as L5-S1 decompression agitation and fusion.   Past Medical History  Diagnosis Date  . Hypertension   . Family history of anesthesia complication     mother N/V  . Arthritis   . Hyperlipemia   . BPH (benign prostatic hyperplasia)   . Hemorrhoid     Past Surgical History  Procedure Date  . Back surgery 05/12/08  . Cervical disc surgery     Allergies  Allergen Reactions  . Crestor (Rosuvastatin) Other (See Comments)    Muscle pain  . Penicillins Hives  . Percocet (Oxycodone-Acetaminophen) Other (See Comments)    hallucinations    History  Substance Use Topics  . Smoking status: Former Smoker -- 4 years    Quit date: 01/17/1973  . Smokeless tobacco: Not on file  . Alcohol Use: No    History reviewed. No pertinent family history. Prior to Admission medications   Medication Sig Start Date End Date Taking? Authorizing Provider  gabapentin (NEURONTIN) 800 MG tablet Take 800 mg by mouth 3 (three) times daily.   Yes Historical Provider, MD  naproxen sodium (ALEVE) 220 MG tablet Take 440 mg by mouth 2 (two) times daily as needed. For pain   Yes Historical Provider, MD  valsartan-hydrochlorothiazide (DIOVAN-HCT) 160-12.5 MG per tablet Take 1 tablet by mouth daily.   Yes Historical Provider, MD  aspirin 81 MG tablet Take 81 mg by mouth daily.    Historical Provider, MD     Review of Systems  Positive ROS: As above  All other systems have been  reviewed and were otherwise negative with the exception of those mentioned in the HPI and as above.  Objective: Vital signs in last 24 hours: Temp:  [98.1 F (36.7 C)] 98.1 F (36.7 C) (10/02 [id number]) Pulse Rate:  [72] 72  (10/02 0637) Resp:  [18] 18  (10/02 0637) BP: (145)/(85) 145/85 mmHg (10/02 0637) SpO2:  [97 %] 97 % (10/02 0637)  General Appearance: Alert, cooperative, no distress, appears stated age Head: Normocephalic, without obvious abnormality, atraumatic Eyes: PERRL, conjunctiva/corneas clear, EOM's intact, fundi benign, both eyes      Ears: Normal TM's and external ear canals, both ears Throat: Lips, mucosa, and tongue normal; teeth and gums normal Neck: Supple, symmetrical, trachea midline, no adenopathy; thyroid: No enlargement/tenderness/nodules; no carotid bruit or JVD Back: Symmetric, no curvature, ROM normal, no CVA tenderness his lumbar incision is well-healed. Lungs: Clear to auscultation bilaterally, respirations unlabored Heart: Regular rate and rhythm, S1 and S2 normal, no murmur, rub or gallop Abdomen: Soft, non-tender, bowel sounds active all four quadrants, no masses, no organomegaly Extremities: Extremities normal, atraumatic, no cyanosis or edema Pulses: 2+ and symmetric all extremities Skin: Skin color, texture, turgor normal, no rashes or lesions  NEUROLOGIC:   Mental status: alert and oriented, no aphasia, good attention span, Fund of knowledge/ memory ok Motor Exam - grossly normal Sensory Exam - grossly normal Reflexes:  Coordination - grossly normal Gait - grossly normal Balance - grossly normal Cranial Nerves: I:  smell Not tested  II: visual acuity  OS: Normal    OD: Normal   II: visual fields Full to confrontation  II: pupils Equal, round, reactive to light  III,VII: ptosis None  III,IV,VI: extraocular muscles  Full ROM  V: mastication Normal  V: facial light touch sensation  Normal  V,VII: corneal reflex  Present  VII: facial muscle  function - upper  Normal  VII: facial muscle function - lower Normal  VIII: hearing Not tested  IX: soft palate elevation  Normal  IX,X: gag reflex Present  XI: trapezius strength  5/5  XI: sternocleidomastoid strength 5/5  XI: neck flexion strength  5/5  XII: tongue strength  Normal    Data Review Lab Results  Component Value Date   WBC 6.1 10/19/2011   HGB 13.8 10/19/2011   HCT 40.7 10/19/2011   MCV 87.5 10/19/2011   PLT 162 10/19/2011   Lab Results  Component Value Date   NA 141 10/19/2011   K 4.2 10/19/2011   CL 106 10/19/2011   CO2 23 10/19/2011   BUN 29* 10/19/2011   CREATININE 1.41* 10/19/2011   GLUCOSE 109* 10/19/2011   No results found for this basename: INR, PROTIME    Assessment/Plan: L5-S1 spondylosis, spondylolisthesis, lumbago, lumbar radiculopathy, stenosis: I discussed situation with the patient. I reviewed his myelo CT with them and pointed out the abnormalities. We have discussed the various treatment options including surgery. I described the surgical option of exploration of lumbar fusion as well L5-S1 decompression, each patient, and fusion. I described the surgery to them. I've shown surgical models. We have discussed the risks, benefits, alternatives and likelihood of achieving our goals with surgery. I have answered all his questions. He was to proceed with the operation.   Kataya Guimont D 10/19/2011 8:27 AM

## 2011-10-19 NOTE — Op Note (Signed)
Brief history: The patient is a 71 year old white male who I performed at L2-3 3445 instrumentation and fusion on many years ago. He has initially done well but has developed recurrent back buttocks and leg pain after a fall. The patient failed medical management and was worked up with a lumbar myelo CT. This demonstrated she had L5 spondylolisthesis, spinal stenosis and severe spinal stenosis. I discussed the various treatment options with the patient including surgery. He has weighed the risks, benefits, and alternatives surgery and decided proceed with a exploration of lumbar fusion with a L5-S1 instrumentation decompression and fusion.  Preoperative diagnosis: L5-S1 spondylolisthesis, Degenerative disc disease, spinal stenosis compressing both the L5 and the S1 nerve roots bilaterally; lumbago; lumbar radiculopathy  Postoperative diagnosis: The same  Procedure: L5 laminectomy with foraminotomies to decompress the bilateral L5 and S1 nerve roots(the work required to do this was in addition to the work required to do the posterior lumbar interbody fusion because of the patient's spinal stenosis, facet arthropathy. Etc. requiring a wide decompression of the nerve roots.); L5-S1 posterior lumbar interbody fusion with local morselized autograft bone and Actifusebone graft extender; insertion of interbody prosthesis at L5-S1 (globus peek interbody prosthesis); posterior segmental instrumentation from L2 to S1with globus titanium pedicle screws and rods connecting to the old Legacy rod and screws; posterior lateral arthrodesis at L5-S1 with local morselized autograft bone and Vitoss bone graft extender.  Surgeon: Dr. Earle Gell  Asst.: Dr. Dominica Severin cram  Anesthesia: Gen. endotracheal  Estimated blood loss: 250 cc  Drains: None  Locations: None  Description of procedure: The patient was brought to the operating room by the anesthesia team. General endotracheal anesthesia was induced. The patient was  turned to the prone position on the Wilson frame. The patient's lumbosacral region was then prepared with Betadine scrub and Betadine solution. Sterile drapes were applied.  I then injected the area to be incised with Marcaine with epinephrine solution. I then used the scalpel to make a linear midline incision over the L5-S1 interspace incising through the previous surgical scar.. I then used electrocautery to perform a bilateral subperiosteal dissection exposing the spinous process and lamina of L5 and S1 as well as the caudal aspect of the old instrumentation..  We then inserted the Verstrac retractor to provide exposure.  I began the decompression by using the high speed drill to perform laminotomies at L5 bilaterally. We then used the Kerrison punches to complete the L5 laminectomy and removed the ligamentum flavum at L5-S1 and to remove the scar tissue at L4-5 decompressing the thecal sac at both levels.. We used the Kerrison punches to remove the medial facets at L5-S1. We performed wide foraminotomies about the bilateral L5 and S1 nerve roots completing the decompression.  We now turned our attention to the posterior lumbar interbody fusion. I used a scalpel to incise the intervertebral disc at L5-S1. I then performed a partial intervertebral discectomy at L5-S1 using the pituitary forceps. We prepared the vertebral endplates at 075-GRM for the fusion by removing the soft tissues with the curettes. We then used the trial spacers to pick the appropriate sized interbody prosthesis. We prefilled his prosthesis with a combination of local morselized autograft bone that we obtained during the decompression as well as Actifuse bone graft extender. We inserted the prefilled prosthesis into the interspace at L5-S1. There was a good snug fit of the prosthesis in the interspace. We then filled and the remainder of the intervertebral disc space with local morselized autograft bone and  Actifuse. This completed the  posterior lumbar interbody arthrodesis.  We now turned attention to the instrumentation. Under fluoroscopic guidance we cannulated the bilateral S1 pedicles with the bone probe. We then removed the bone probe. We then tapped the pedicle with a 6.5 millimeter tap. We then removed the tap. We probed inside the tapped pedicle with a ball probe to rule out cortical breaches. We then inserted a 7.5 x 50 millimeter pedicle screw into the S1 pedicles bilaterally under fluoroscopic guidance. We then palpated along the medial aspect of the pedicles to rule out cortical breaches. There were none. The nerve roots were not injured. We then used the globus connectors to connect a new rod in the S1 pedicle screw into the old construct. We did this bilaterally.. We compressed the construct and secured the rod in place with the caps. We then tightened the caps appropriately. This completed the instrumentation from L2-S1.  We now turned our attention to the posterior lateral arthrodesis at L5-S1. We used the high-speed drill to decorticate the remainder of the facets, pars, transverse process at L5-S1. We then applied a combination of local morselized autograft bone and Vitoss bone graft extender over these decorticated posterior lateral structures. This completed the posterior lateral arthrodesis.  We then obtained hemostasis using bipolar electrocautery. We irrigated the wound out with bacitracin solution. We inspected the thecal sac and nerve roots and noted they were well decompressed. We then removed the retractor. We reapproximated patient's thoracolumbar fascia with interrupted #1 Vicryl suture. We reapproximated patient's subcutaneous tissue with interrupted 2-0 Vicryl suture. The reapproximated patient's skin with Steri-Strips and benzoin. The wound was then coated with bacitracin ointment. A sterile dressing was applied. The drapes were removed. The patient was subsequently returned to the supine position where they  were extubated by the anesthesia team. He was then transported to the post anesthesia care unit in stable condition. All sponge instrument and needle counts were reportedly correct at the end of this case.

## 2011-10-19 NOTE — Progress Notes (Signed)
Subjective:  The patient is alert and pleasant. He looks well. He is in no apparent distress.  Objective: Vital signs in last 24 hours: Temp:  [96.8 F (36 C)-98.1 F (36.7 C)] 96.8 F (36 C) (10/02 1256) Pulse Rate:  [72-91] 91  (10/02 1300) Resp:  [18-20] 20  (10/02 1300) BP: (145-167)/(75-85) 167/75 mmHg (10/02 1300) SpO2:  [97 %-99 %] 99 % (10/02 1300)  Intake/Output from previous day:   Intake/Output this shift: Total I/O In: 2600 [I.V.:2600] Out: 525 [Urine:300; Blood:225]  Physical exam the patient is moving his lower extremities well.  Lab Results:  [facility] 10/19/11 0656  WBC 6.1  HGB 13.8  HCT 40.7  PLT 162   BMET  Basename 10/19/11 0656  NA 141  K 4.2  CL 106  CO2 23  GLUCOSE 109*  BUN 29*  CREATININE 1.41*  CALCIUM 9.2    Studies/Results: Dg Chest 2 View  10/19/2011  *RADIOLOGY REPORT*  Clinical Data: Preop for laminectomy.  CHEST - 2 VIEW  Comparison: Two-view chest 05/14/2008.  Findings: The heart size is normal.  The lungs are clear.  The visualized soft tissues and bony thorax are unremarkable.  IMPRESSION: Negative chest.   Original Report Authenticated By: Resa Miner. MATTERN, M.D.     Assessment/Plan: The patient is doing well.  LOS: 0 days     Claressa Hughley D 10/19/2011, 1:19 PM

## 2011-10-19 NOTE — Anesthesia Preprocedure Evaluation (Signed)
Anesthesia Evaluation  Patient identified by MRN, date of birth, ID band Patient awake    Reviewed: Allergy & Precautions, H&P , NPO status , Patient's Chart, lab work & pertinent test results, reviewed documented beta blocker date and time   History of Anesthesia Complications (+) Family history of anesthesia reaction  Airway Mallampati: III TM Distance: >3 FB Neck ROM: limited    Dental   Pulmonary neg pulmonary ROS,  breath sounds clear to auscultation        Cardiovascular hypertension, On Medications Rhythm:regular     Neuro/Psych negative neurological ROS  negative psych ROS   GI/Hepatic negative GI ROS, Neg liver ROS,   Endo/Other  negative endocrine ROS  Renal/GU negative Renal ROS  negative genitourinary   Musculoskeletal   Abdominal   Peds  Hematology negative hematology ROS (+)   Anesthesia Other Findings See surgeon's H&P   Reproductive/Obstetrics negative OB ROS                           Anesthesia Physical Anesthesia Plan  ASA: III  Anesthesia Plan: General   Post-op Pain Management:    Induction: Intravenous  Airway Management Planned: Oral ETT and Video Laryngoscope Planned  Additional Equipment:   Intra-op Plan:   Post-operative Plan: Extubation in OR  Informed Consent: I have reviewed the patients History and Physical, chart, labs and discussed the procedure including the risks, benefits and alternatives for the proposed anesthesia with the patient or authorized representative who has indicated his/her understanding and acceptance.   Dental Advisory Given  Plan Discussed with: CRNA and Surgeon  Anesthesia Plan Comments:         Anesthesia Quick Evaluation

## 2011-10-19 NOTE — Transfer of Care (Signed)
Immediate Anesthesia Transfer of Care Note  Patient: Chase Evans  Procedure(s) Performed: Procedure(s) (LRB) with comments: POSTERIOR LUMBAR FUSION 1 WITH HARDWARE REMOVAL (N/A) - Exploration of lumbar fusion with Lumbar five-sacral one laminectomy with posterior lumbar interbody fusion interbody prothesis posterolateral arthrodesis and posterior segmental instrumentation  Patient Location: PACU  Anesthesia Type: General  Level of Consciousness: patient cooperative and responds to stimulation  Airway & Oxygen Therapy: Patient Spontanous Breathing and Patient connected to face mask oxygen  Post-op Assessment: Report given to PACU RN and Post -op Vital signs reviewed and stable  Post vital signs: Reviewed and stable  Complications: No apparent anesthesia complications

## 2011-10-19 NOTE — OR Nursing (Signed)
Patient was source for blood exposure to staff member. Per hospital policy, blood was drawn for exposure panel.

## 2011-10-19 NOTE — Progress Notes (Signed)
Orthopedic Tech Progress Note Patient Details:  Chase Evans 01/09/1941 [id number]  Patient ID: Chase Evans, male   DOB: 1940/04/20, 71 y.o.   MRN: [id number]   Chase Evans 10/19/2011, 5:36 PM L-S SUPPORT COMPLETED BY BIO TECH

## 2011-10-20 ENCOUNTER — Inpatient Hospital Stay (HOSPITAL_COMMUNITY): Payer: Medicare Other

## 2011-10-20 LAB — BASIC METABOLIC PANEL
BUN: 25 mg/dL — ABNORMAL HIGH (ref 6–23)
CO2: 28 mEq/L (ref 19–32)
Calcium: 8.4 mg/dL (ref 8.4–10.5)
Chloride: 103 mEq/L (ref 96–112)
Creatinine, Ser: 1.46 mg/dL — ABNORMAL HIGH (ref 0.50–1.35)

## 2011-10-20 LAB — CBC
HCT: 34.1 % — ABNORMAL LOW (ref 39.0–52.0)
MCH: 30 pg (ref 26.0–34.0)
MCV: 87.4 fL (ref 78.0–100.0)
RBC: 3.9 MIL/uL — ABNORMAL LOW (ref 4.22–5.81)
RDW: 13.1 % (ref 11.5–15.5)
WBC: 7.9 10*3/uL (ref 4.0–10.5)

## 2011-10-20 LAB — CBC WITH DIFFERENTIAL/PLATELET
Basophils Absolute: 0 10*3/uL (ref 0.0–0.1)
Basophils Relative: 0 % (ref 0–1)
Eosinophils Absolute: 0.5 10*3/uL (ref 0.0–0.7)
Eosinophils Relative: 4 % (ref 0–5)
Lymphs Abs: 1.1 10*3/uL (ref 0.7–4.0)
MCH: 29.6 pg (ref 26.0–34.0)
MCHC: 33.7 g/dL (ref 30.0–36.0)
MCV: 87.6 fL (ref 78.0–100.0)
Platelets: 148 10*3/uL — ABNORMAL LOW (ref 150–400)
RDW: 13.1 % (ref 11.5–15.5)

## 2011-10-20 MED ORDER — ACETAMINOPHEN 325 MG PO TABS
650.0000 mg | ORAL_TABLET | ORAL | Status: DC | PRN
  Administered 2011-10-20 – 2011-10-22 (×3): 650 mg via ORAL
  Filled 2011-10-20 (×3): qty 2

## 2011-10-20 NOTE — Evaluation (Signed)
Occupational Therapy Evaluation Patient Details Name: Chase Evans MRN: [id number] DOB: April 18, 1940 Today's Date: 10/20/2011 Time: 1449-1510 OT Time Calculation (min): 21 min  OT Assessment / Plan / Recommendation Clinical Impression  Pt s/p L5-S1decompression fusion after fall while climbing over bobcat. Pt will benefit from skilled OT in the acute setting to maximize I with ADL and ADL mobility prior to d/c    OT Assessment  Patient needs continued OT Services    Follow Up Recommendations  No OT follow up;Supervision/Assistance - 24 hour    Barriers to Discharge      Equipment Recommendations  None recommended by PT (TBD by OT)    Recommendations for Other Services    Frequency  Min 2X/week    Precautions / Restrictions Precautions Precautions: Back Required Braces or Orthoses: Spinal Brace Spinal Brace: Applied in sitting position;Lumbar corset Restrictions Weight Bearing Restrictions: No   Pertinent Vitals/Pain Pt reports back pain but did not rate. Pt pre-medicated    ADL  Eating/Feeding: Simulated;Set up;Supervision/safety Where Assessed - Eating/Feeding: Bed level Upper Body Dressing: Simulated;Moderate assistance Where Assessed - Upper Body Dressing: Unsupported sitting Lower Body Dressing: Simulated;Maximal assistance Where Assessed - Lower Body Dressing: Supported sit to stand Toilet Transfer: Performed;Min guard Armed forces technical officer Method: Sit to Loss adjuster, chartered: Comfort height toilet;Grab bars Toileting - Water quality scientist and Hygiene: Performed;Moderate assistance Where Assessed - Toileting Clothing Manipulation and Hygiene: Standing Equipment Used: Gait belt;Back brace;Rolling walker Transfers/Ambulation Related to ADLs: Pt Min guard to Min A with ambulation- unsteady ADL Comments: Pt and family educated on back precautions, brace donning and adjustment as well as DME. Pt will need AE education as well as tub transfer practice and  DME needs addressed    OT Diagnosis: Generalized weakness;Acute pain  OT Problem List: Decreased activity tolerance;Impaired balance (sitting and/or standing);Decreased knowledge of use of DME or AE;Decreased knowledge of precautions;Pain OT Treatment Interventions: Self-care/ADL training;DME and/or AE instruction;Therapeutic activities;Patient/family education;Balance training   OT Goals Acute Rehab OT Goals OT Goal Formulation: With patient Time For Goal Achievement: 11/03/11 Potential to Achieve Goals: Good ADL Goals Pt Will Perform Grooming: Independently;Standing at sink ADL Goal: Grooming - Progress: Goal set today Pt Will Perform Lower Body Bathing: with modified independence;Sit to stand in shower ADL Goal: Lower Body Bathing - Progress: Goal set today Pt Will Perform Lower Body Dressing: with modified independence;Sit to stand from bed;Sit to stand from chair ADL Goal: Lower Body Dressing - Progress: Goal set today Pt Will Transfer to Toilet: with modified independence;Ambulation;with DME ADL Goal: Toilet Transfer - Progress: Goal set today Pt Will Perform Toileting - Clothing Manipulation: Independently;Standing ADL Goal: Toileting - Clothing Manipulation - Progress: Goal set today Pt Will Perform Toileting - Hygiene: with modified independence;Standing at 3-in-1/toilet ADL Goal: Toileting - Hygiene - Progress: Goal set today Pt Will Perform Tub/Shower Transfer: Tub transfer;with supervision;Ambulation;with DME ADL Goal: Tub/Shower Transfer - Progress: Goal set today Additional ADL Goal #1: Pt will verbalize/generalize 3/3 back precautions for use of functional activities. ADL Goal: Additional Goal #1 - Progress: Goal set today Additional ADL Goal #2: Pt will I'ly don, doff and adjust brace as needed. ADL Goal: Additional Goal #2 - Progress: Goal set today  Visit Information  Last OT Received On: 10/20/11 Assistance Needed: +1    Subjective Data  Subjective: "I'm feeling  fine as hair on a frog" Patient Stated Goal: Return home   Prior Functioning     Home Living Lives With: Alone Available Help at Discharge: Family;Available  24 hours/day Type of Home: House Home Access: Stairs to enter [employer] of Steps: 5 Entrance Stairs-Rails: Right Home Layout: One level Biochemist, clinical: Standard Home Adaptive Equipment: Walker - rolling;Straight cane Prior Function Level of Independence: Independent Able to Take Stairs?: Yes Driving: Yes Vocation: Retired Corporate investment banker: No difficulties Dominant Hand: Right         Vision/Perception     Cognition  Overall Cognitive Status: Appears within functional limits for tasks assessed/performed Arousal/Alertness: Awake/alert Orientation Level: Appears intact for tasks assessed Behavior During Session: [facility] for tasks performed    Extremity/Trunk Assessment Right Upper Extremity Assessment RUE ROM/Strength/Tone: Unable to fully assess;Due to pain RUE Sensation: WFL - Light Touch RUE Coordination: WFL - gross/fine motor Left Upper Extremity Assessment LUE ROM/Strength/Tone: Due to pain;Unable to fully assess LUE Sensation: WFL - Light Touch LUE Coordination: WFL - gross/fine motor     Mobility Bed Mobility Rolling Right: 5: Supervision Right Sidelying to Sit: 5: Supervision;HOB flat Details for Bed Mobility Assistance: cueing for sequence and precautions Transfers Sit to Stand: 4: Min guard;From bed Stand to Sit: 4: Min guard;To toilet;With armrests Details for Transfer Assistance: cueing for posture and precautions     Shoulder Instructions     Exercise     Balance     End of Session OT - End of Session Equipment Utilized During Treatment: Gait belt Activity Tolerance: Patient limited by fatigue;Patient limited by pain Patient left: with family/visitor present (sitting on toilet in bathroom; RN informed) Nurse Communication: Mobility status  GO      Quatavious Rossa 10/20/2011, 3:58 PM

## 2011-10-20 NOTE — Evaluation (Signed)
Physical Therapy Evaluation Patient Details Name: Chase Evans MRN: [id number] DOB: 01/30/1940 Today's Date: 10/20/2011 Time: [id number] PT Time Calculation (min): 31 min  PT Assessment / Plan / Recommendation Clinical Impression  Pt admitted for L5-S1decompression fusion after fall while climbing over bobcat. Pt very pleasant and somewhat familiar with precautions and restrictions from prior back surgery. Pt will benefit from acute therapy to maximize mobility, gait and independence prior to discharge.     PT Assessment  Patient needs continued PT services    Follow Up Recommendations  No PT follow up    Barriers to Discharge Decreased caregiver support      Equipment Recommendations  None recommended by PT    Recommendations for Other Services     Frequency Min 5X/week    Precautions / Restrictions Precautions Precautions: Back Precaution Booklet Issued: Yes (comment) Required Braces or Orthoses: Spinal Brace Spinal Brace: Applied in sitting position;Lumbar corset   Pertinent Vitals/Pain No pain      Mobility  Bed Mobility Bed Mobility: Rolling Right;Right Sidelying to Sit Rolling Right: 5: Supervision Right Sidelying to Sit: 5: Supervision;HOB flat Details for Bed Mobility Assistance: cueing for sequence and precautions Transfers Transfers: Sit to Stand;Stand to Sit Sit to Stand: 5: Supervision;From bed Stand to Sit: 5: Supervision;With armrests;To chair/3-in-1 Details for Transfer Assistance: cueing for posture and precautions Ambulation/Gait Ambulation/Gait Assistance: 5: Supervision Ambulation Distance (Feet): 300 Feet Assistive device: Rolling walker Ambulation/Gait Assistance Details: cueing for posture and position in RW Gait Pattern: Step-through pattern;Decreased stride length;Trunk flexed Gait velocity: decreased Stairs: Yes Stairs Assistance: 6: Modified independent (Device/Increase time) Stair Management Technique: One rail Right Number of  Stairs: 5     Shoulder Instructions     Exercises     PT Diagnosis: Difficulty walking  PT Problem List: Decreased activity tolerance;Decreased knowledge of precautions;Decreased knowledge of use of DME PT Treatment Interventions: Gait training;DME instruction;Functional mobility training;Therapeutic activities;Patient/family education   PT Goals Acute Rehab PT Goals PT Goal Formulation: With patient Time For Goal Achievement: 10/27/11 Potential to Achieve Goals: Good Pt will go Supine/Side to Sit: with modified independence;with HOB 0 degrees PT Goal: Supine/Side to Sit - Progress: Goal set today Pt will go Sit to Supine/Side: with modified independence;with HOB 0 degrees PT Goal: Sit to Supine/Side - Progress: Goal set today Pt will go Sit to Stand: with modified independence PT Goal: Sit to Stand - Progress: Goal set today Pt will Ambulate: >150 feet;with modified independence;with least restrictive assistive device PT Goal: Ambulate - Progress: Goal set today Additional Goals Additional Goal #1: Pt will independently state and demonstrate adherence to 3/3 back precautions PT Goal: Additional Goal #1 - Progress: Goal set today  Visit Information  Last PT Received On: 10/20/11 Assistance Needed: +1    Subjective Data  Subjective: 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 [employer]   Prior [place] Lives With: Alone Available Help at Discharge: Family;Available 24 hours/day Type of Home: House Home Access: Stairs to enter [employer] of Steps: 5 Entrance Stairs-Rails: Right Home Layout: One level Biochemist, clinical: Standard Home Adaptive Equipment: Walker - rolling;Straight cane Prior Function Level of Independence: Independent Able to Take Stairs?: Yes Driving: Yes Vocation: Retired Corporate investment banker: No difficulties    Cognition  Overall Cognitive Status: Appears within  functional limits for tasks assessed/performed Arousal/Alertness: Awake/alert Orientation Level: Appears intact for tasks assessed Behavior During Session: [facility] for tasks performed  Extremity/Trunk Assessment Right Lower Extremity Assessment RLE ROM/Strength/Tone: [facility] for tasks assessed Left Lower Extremity Assessment LLE ROM/Strength/Tone: WFL for tasks assessed Trunk Assessment Trunk Assessment: Kyphotic   Balance    End of Session PT - End of Session Equipment Utilized During Treatment: Gait belt;Back brace Activity Tolerance: Patient tolerated treatment well Patient left: in chair;with call bell/phone within reach Nurse Communication: Mobility status  GP     Lanetta Inch [name] 10/20/2011, 8:58 AM  [name], [place]

## 2011-10-20 NOTE — Progress Notes (Signed)
UR COMPLETED  

## 2011-10-20 NOTE — Progress Notes (Signed)
Patient ID: Chase Evans, male   DOB: 1940/09/12, 71 y.o.   MRN: [id number] Subjective:  The patient is alert and oriented. He looks well. He denies pain. He feels better already.  Objective: Vital signs in last 24 hours: Temp:  [96.8 F (36 C)-99.4 F (37.4 C)] 99.4 F (37.4 C) (10/03 0821) Pulse Rate:  [64-106] 106  (10/03 0821) Resp:  [10-20] 16  (10/03 0821) BP: (109-167)/(51-85) 109/60 mmHg (10/03 0821) SpO2:  [92 %-100 %] 92 % (10/03 0821)  Intake/Output from previous day: 10/02 0701 - 10/03 0700 In: 3600 [P.O.:600; I.V.:2600] Out: 2025 [Urine:1800; Blood:225] Intake/Output this shift:    Physical exam the patient is alert and oriented x3 his strength is normal.  Lab Results:  Basename 10/20/11 0430 10/19/11 0656  WBC 7.9 6.1  HGB 11.7* 13.8  HCT 34.1* 40.7  PLT 133* 162   BMET  Basename 10/20/11 0430 10/19/11 0656  NA 138 141  K 4.6 4.2  CL 103 106  CO2 28 23  GLUCOSE 113* 109*  BUN 25* 29*  CREATININE 1.46* 1.41*  CALCIUM 8.4 9.2    Studies/Results: Dg Chest 2 View  10/19/2011  *RADIOLOGY REPORT*  Clinical Data: Preop for laminectomy.  CHEST - 2 VIEW  Comparison: Two-view chest 05/14/2008.  Findings: The heart size is normal.  The lungs are clear.  The visualized soft tissues and bony thorax are unremarkable.  IMPRESSION: Negative chest.   Original Report Authenticated By: Resa Miner. MATTERN, M.D.    Dg Lumbar Spine 2-3 Views  10/19/2011  *RADIOLOGY REPORT*  Clinical Data: Intraoperative spot radiographs during posterior extension of lumbar interbody fusion to include the L5-S1 level  DG C-ARM 1-60 MIN,LUMBAR SPINE - 2-3 VIEW  Technique: AP and cross-table lateral spot images obtained at the time of surgery.  Comparison:  Radiographs 09/13/2011  Findings: Frontal and cross-table lateral intraoperative spot images demonstrate interval placement of bilateral S1 pedicle screws and interbody graft at L5-S1 incompletely imaged L2-L5 posterior fixation  hardware appears intact.  No immediate hardware complication. Alignment appears similar to prior.  IMPRESSION:  Extension of L2-L5 PLIF to include the L5-S1 level as detailed above.   Original Report Authenticated By: Juanito Doom C-arm 1-60 Min  10/19/2011  *RADIOLOGY REPORT*  Clinical Data: Intraoperative spot radiographs during posterior extension of lumbar interbody fusion to include the L5-S1 level  DG C-ARM 1-60 MIN,LUMBAR SPINE - 2-3 VIEW  Technique: AP and cross-table lateral spot images obtained at the time of surgery.  Comparison:  Radiographs 09/13/2011  Findings: Frontal and cross-table lateral intraoperative spot images demonstrate interval placement of bilateral S1 pedicle screws and interbody graft at L5-S1 incompletely imaged L2-L5 posterior fixation hardware appears intact.  No immediate hardware complication. Alignment appears similar to prior.  IMPRESSION:  Extension of L2-L5 PLIF to include the L5-S1 level as detailed above.   Original Report Authenticated By: Myrle Sheng     Assessment/Plan: Postop day 1: The patient is doing well. We will mobilize him with PT and OT. He will likely go home and next day or 2.  LOS: 1 day     Khamil Lamica D 10/20/2011, 9:57 AM

## 2011-10-21 LAB — URINALYSIS, ROUTINE W REFLEX MICROSCOPIC
Glucose, UA: NEGATIVE mg/dL
Ketones, ur: NEGATIVE mg/dL
Leukocytes, UA: NEGATIVE
Protein, ur: NEGATIVE mg/dL
Urobilinogen, UA: 0.2 mg/dL (ref 0.0–1.0)

## 2011-10-21 LAB — URINE MICROSCOPIC-ADD ON

## 2011-10-21 MED ORDER — HYDROMORPHONE HCL 2 MG PO TABS
2.0000 mg | ORAL_TABLET | ORAL | Status: DC | PRN
  Administered 2011-10-21 – 2011-10-22 (×4): 2 mg via ORAL
  Administered 2011-10-22: 4 mg via ORAL
  Administered 2011-10-22 – 2011-10-24 (×3): 2 mg via ORAL
  Filled 2011-10-21 (×3): qty 1
  Filled 2011-10-21: qty 2
  Filled 2011-10-21 (×4): qty 1

## 2011-10-21 MED FILL — Heparin Sodium (Porcine) Inj 1000 Unit/ML: INTRAMUSCULAR | Qty: 30 | Status: AC

## 2011-10-21 MED FILL — Sodium Chloride Irrigation Soln 0.9%: Qty: 3000 | Status: AC

## 2011-10-21 MED FILL — Sodium Chloride IV Soln 0.9%: INTRAVENOUS | Qty: 1000 | Status: AC

## 2011-10-21 NOTE — Care Management Note (Signed)
    Page 1 of 2   10/21/2011     4:41:40 PM   CARE MANAGEMENT NOTE 10/21/2011  Patient:  KAEDE, EISNER   Account Number:  [id number]  Date Initiated:  10/21/2011  Documentation initiated by:  Rozanna Boer  Subjective/Objective Assessment:   POD#2 s/p lumbar fusion  [place] services recommended.  no DME needs  plan to d/c home with family assistance     Action/Plan:   Home with [facility] services   Anticipated DC Date:  10/22/2011   Anticipated DC Plan:  [place]  CM consult      [facility] Choice  HOME HEALTH   Choice offered to / List presented to:  C-1 Patient        [place] arranged  [facility] [employer]   Status of service:  Completed, signed off Medicare Important Message given?   (If response is "NO", the following Medicare IM given date fields will be blank) Date Medicare IM given:   Date Additional Medicare IM given:    Discharge Disposition:  [place]  Per UR Regulation:  Reviewed for med. necessity/level of care/duration of stay  If discussed at [place] of Stay Meetings, dates discussed:    Comments:  10/21/11  16:39  Rozanna Boer RN/CM HHPT/HHA recommended, spoke with patient, patient agrees to [facility] services, his choice is [place], arranged for HHPT/HHA to begin after discharge home.

## 2011-10-21 NOTE — Progress Notes (Signed)
Patient ID: Chase Evans, male   DOB: 25-Nov-1940, 71 y.o.   MRN: [id number] Subjective:  The patient is alert and pleasant. His back is appropriately sore. He is having some dysuria.  Objective: Vital signs in last 24 hours: Temp:  [99.3 F (37.4 C)-103.1 F (39.5 C)] 100.5 F (38.1 C) (10/04 0341) Pulse Rate:  [100-117] 112  (10/04 0341) Resp:  [16-22] 18  (10/04 0341) BP: (109-152)/(60-71) 150/71 mmHg (10/04 0341) SpO2:  [92 %-97 %] 97 % (10/04 0341)  Intake/Output from previous day: 10/03 0701 - 10/04 0700 In: -  Out: [id number] [Urine:1775] Intake/Output this shift: Total I/O In: -  Out: 425 [Urine:425]  Physical exam the patient is alert and oriented. His strength is grossly normal his lower extremities. His dressing is clean and dry.  Lab Results:  [facility] 10/20/11 2119 10/20/11 0430  WBC 11.8* 7.9  HGB 11.2* 11.7*  HCT 33.2* 34.1*  PLT 148* 133*   BMET  Basename 10/20/11 0430 10/19/11 0656  NA 138 141  K 4.6 4.2  CL 103 106  CO2 28 23  GLUCOSE 113* 109*  BUN 25* 29*  CREATININE 1.46* 1.41*  CALCIUM 8.4 9.2    Studies/Results: Dg Lumbar Spine 2-3 Views  10/19/2011  *RADIOLOGY REPORT*  Clinical Data: Intraoperative spot radiographs during posterior extension of lumbar interbody fusion to include the L5-S1 level  DG C-ARM 1-60 MIN,LUMBAR SPINE - 2-3 VIEW  Technique: AP and cross-table lateral spot images obtained at the time of surgery.  Comparison:  Radiographs 09/13/2011  Findings: Frontal and cross-table lateral intraoperative spot images demonstrate interval placement of bilateral S1 pedicle screws and interbody graft at L5-S1 incompletely imaged L2-L5 posterior fixation hardware appears intact.  No immediate hardware complication. Alignment appears similar to prior.  IMPRESSION:  Extension of L2-L5 PLIF to include the L5-S1 level as detailed above.   Original Report Authenticated By: Juanito Doom Chest Port 1 View  10/20/2011  *RADIOLOGY REPORT*  Clinical  Data: Fever.  PORTABLE CHEST - 1 VIEW  Comparison: 10/19/2011  Findings: Stable asymmetric elevation of the right hemidiaphragm. The cardiopericardial silhouette is enlarged.  Mild vascular congestion without overt edema.  There is no focal airspace consolidation.  No pleural effusion. Imaged bony structures of the thorax are intact.  IMPRESSION: Low volumes with vascular congestion.  No edema or focal airspace consolidation.   Original Report Authenticated By: ERIC A. MANSELL, M.D.    Dg C-arm 1-60 Min  10/19/2011  *RADIOLOGY REPORT*  Clinical Data: Intraoperative spot radiographs during posterior extension of lumbar interbody fusion to include the L5-S1 level  DG C-ARM 1-60 MIN,LUMBAR SPINE - 2-3 VIEW  Technique: AP and cross-table lateral spot images obtained at the time of surgery.  Comparison:  Radiographs 09/13/2011  Findings: Frontal and cross-table lateral intraoperative spot images demonstrate interval placement of bilateral S1 pedicle screws and interbody graft at L5-S1 incompletely imaged L2-L5 posterior fixation hardware appears intact.  No immediate hardware complication. Alignment appears similar to prior.  IMPRESSION:  Extension of L2-L5 PLIF to include the L5-S1 level as detailed above.   Original Report Authenticated By: Myrle Sheng     Assessment/Plan: Postop day #2: We will continue to mobilize the patient with PT and OT. He will likely go home this weekend. I have given him his discharge instructions and answered all his questions.  Fever: The patient's chest x-ray demonstrates some low volumes but no obvious pneumonia. His white count is mildly elevated. We are awaiting the urine culture  and blood culture.  LOS: 2 days     Tawney Vanorman D 10/21/2011, 7:57 AM

## 2011-10-21 NOTE — Progress Notes (Signed)
Occupational Therapy Treatment Patient Details Name: Chase Evans MRN: [id number] DOB: 1941/01/08 Today's Date: 10/21/2011 Time: [id number] OT Time Calculation (min): 28 min  OT Assessment / Plan / Recommendation Comments on Treatment Session Pt making little progress today, limited by pain and lethargy. Encouraged increased ambulation with nursing later today.     Follow Up Recommendations  Home health OT;Supervision/Assistance - 24 hour    Barriers to Discharge       Equipment Recommendations  None recommended by PT (TBD pending what pt has at home)    Recommendations for Other Services    Frequency     Plan Discharge plan needs to be updated    Precautions / Restrictions Precautions Precautions: Back Spinal Brace: Lumbar corset;Applied in sitting position Spinal Brace Comments: able to doff independently Restrictions Weight Bearing Restrictions: No   Pertinent Vitals/Pain Pt reports 8/10 back pain with coughing and "some" movements. Pt pre-medicated and RN aware. Pt repositioned for pain relief    ADL  Lower Body Dressing: Simulated;+1 Total assistance Where Assessed - Lower Body Dressing: Supported sit to stand Toilet Transfer: Pharmacologist Method: Arts development officer:  (chair to bed) Equipment Used: Gait belt;Back brace;Rolling walker Transfers/Ambulation Related to ADLs: Pt Min guard to Min A with ambulation- unsteady ADL Comments: Educated pt and son on AE. Pt declined practice at this time. D/w pt and son various toileting aids as well as bidets. Pt more painful and lethargic today. Pt re-educated on 3/3 back precautions as he was only able to recall 1/3   OT Diagnosis:    OT Problem List:   OT Treatment Interventions:     OT Goals ADL Goals ADL Goal: Lower Body Bathing - Progress: Progressing toward goals ADL Goal: Lower Body Dressing - Progress: Progressing toward goals ADL Goal: Toilet Transfer -  Progress: Progressing toward goals ADL Goal: Toileting - Hygiene - Progress: Progressing toward goals ADL Goal: Additional Goal #1 - Progress: Progressing toward goals ADL Goal: Additional Goal #2 - Progress: Progressing toward goals  Visit Information  Last OT Received On: 10/21/11 Assistance Needed: +1    Subjective Data      Prior Functioning       Cognition  Overall Cognitive Status: Appears within functional limits for tasks assessed/performed Arousal/Alertness: Lethargic Orientation Level: Appears intact for tasks assessed Behavior During Session: [facility] for tasks performed    Mobility  Shoulder Instructions Bed Mobility Bed Mobility: Sit to Sidelying Right;Rolling Left Rolling Left: 4: Min guard;With rail Sit to Sidelying Right: 4: Min assist;HOB flat Details for Bed Mobility Assistance: cueing for sequence and precautions Transfers Sit to Stand: 4: Min assist;With armrests;From chair/3-in-1 Stand to Sit: 4: Min guard;To bed;With upper extremity assist Details for Transfer Assistance: Pt with incr difficulty standing from chair- limited by pain. VC for sequencing.        Exercises      Balance     End of Session OT - End of Session Equipment Utilized During Treatment: Gait belt Activity Tolerance: Patient limited by fatigue;Patient limited by pain Patient left: with family/visitor present Nurse Communication: Mobility status  GO     Chase Evans 10/21/2011, 12:14 PM

## 2011-10-21 NOTE — Progress Notes (Signed)
Referral received for SNF. Chart reviewed and CSW has spoken with RNwho indicates that patient is for DC to home with no PT/OT needs identified per their evaluations.   CSW to sign off. Please re-consult if CSW needs arise.  Lorie Phenix. [place], [place]

## 2011-10-21 NOTE — Progress Notes (Signed)
Physical Therapy Treatment Patient Details Name: Chase Evans MRN: [id number] DOB: 1940-06-08 Today's Date: 10/21/2011 Time: [id number] PT Time Calculation (min): 44 min  PT Assessment / Plan / Recommendation Comments on Treatment Session  Pt admitted s/p L5-S1decompression fusion who is having more pain with mobility today with 2/10 at rest shooting up to 7/10 periodically throughout despite use of PCA throughout session. Dr.Jenkins present initiation of session and aware. Pt with increased difficulty with ambulation, transfers and mobility today. Pt able to state 2/3 precautions and able to don brace with cueing for correct positioning. Due to pt decreased ability today HHPT may be beneficial and pt will continue to need to use RW with all gait trials at this time.     Follow Up Recommendations  Home health PT (unless pt reaches mod I prior to discharge)    Barriers to Discharge        Equipment Recommendations  None recommended by PT    Recommendations for Other Services    Frequency     Plan Discharge plan needs to be updated;Frequency remains appropriate    Precautions / Restrictions Precautions Precautions: Back Spinal Brace: Lumbar corset;Applied in sitting position   Pertinent Vitals/Pain 2/10 at rest 7/10 with activity with periods of shooting pain into RLE    Mobility  Bed Mobility Bed Mobility: Not assessed Transfers Transfers: Sit to Stand Sit to Stand: 4: Min guard;4: Min assist;From bed;From chair/3-in-1 Stand to Sit: 4: Min assist;To chair/3-in-1;With armrests Details for Transfer Assistance: Pt stood from chair with armrests with cueing for posture and safety. Pt then walking to end of bed to begin ambulation when coughed and LOB with assist to control sitting to EOB nearby, standing from the bed x 2 with min assist and assist to bed and  chair at end to control descent and for hand placement Ambulation/Gait Ambulation/Gait Assistance: 4: Min  guard Ambulation Distance (Feet): 380 Feet Assistive device: Rolling walker Ambulation/Gait Assistance Details: Pt with constant cueing for posture and position in RW with very slow gait today. On return half of ambulation pt with periodic shooting pains causing pt to partially buckle knees with assist to maintain balance and standing. Despite repeated questioning of if pt needed to stop or have chair pulled to him he kept walking with progressively slower gait with increasing partial buckling with chair pulled to pt despite his desire to make it all the way back.  Gait Pattern: Step-to pattern;Trunk flexed Gait velocity: very slow today Stairs: No    Exercises     PT Diagnosis:    PT Problem List:   PT Treatment Interventions:     PT Goals Acute Rehab PT Goals PT Goal: Sit to Stand - Progress: Progressing toward goal PT Goal: Ambulate - Progress: Progressing toward goal Additional Goals PT Goal: Additional Goal #1 - Progress: Progressing toward goal  Visit Information  Last PT Received On: 10/21/11 Assistance Needed: +1    Subjective Data  Subjective: "It's hurting more today"   "i'm hard headed"   Cognition  Overall Cognitive Status: Appears within functional limits for tasks assessed/performed Arousal/Alertness: Awake/alert Orientation Level: Appears intact for tasks assessed Behavior During Session: [facility] for tasks performed    Balance     End of Session PT - End of Session Equipment Utilized During Treatment: Back brace Activity Tolerance: Patient limited by pain Patient left: in chair;with call bell/phone within reach;with nursing in room;with family/visitor present Nurse Communication: Mobility status   GP     [name],  [name] [name] 10/21/2011, 8:25 AM [name], [name]

## 2011-10-22 LAB — URINE CULTURE: Colony Count: NO GROWTH

## 2011-10-22 MED ORDER — TAMSULOSIN HCL 0.4 MG PO CAPS
0.4000 mg | ORAL_CAPSULE | Freq: Every day | ORAL | Status: DC
  Administered 2011-10-22 – 2011-10-24 (×3): 0.4 mg via ORAL
  Filled 2011-10-22 (×3): qty 1

## 2011-10-22 MED ORDER — METHYLPREDNISOLONE 4 MG PO KIT
8.0000 mg | PACK | Freq: Every morning | ORAL | Status: AC
  Administered 2011-10-22: 8 mg via ORAL
  Filled 2011-10-22: qty 21

## 2011-10-22 MED ORDER — METHYLPREDNISOLONE 4 MG PO KIT
4.0000 mg | PACK | ORAL | Status: AC
  Administered 2011-10-22: 4 mg via ORAL

## 2011-10-22 MED ORDER — METHYLPREDNISOLONE 4 MG PO KIT
4.0000 mg | PACK | Freq: Four times a day (QID) | ORAL | Status: DC
  Administered 2011-10-24 (×2): 4 mg via ORAL

## 2011-10-22 MED ORDER — METHYLPREDNISOLONE 4 MG PO KIT
4.0000 mg | PACK | Freq: Three times a day (TID) | ORAL | Status: AC
  Administered 2011-10-23 (×3): 4 mg via ORAL

## 2011-10-22 MED ORDER — METHYLPREDNISOLONE 4 MG PO KIT
8.0000 mg | PACK | Freq: Every evening | ORAL | Status: AC
  Administered 2011-10-23: 8 mg via ORAL

## 2011-10-22 MED ORDER — METHYLPREDNISOLONE 4 MG PO KIT
8.0000 mg | PACK | Freq: Every evening | ORAL | Status: AC
  Administered 2011-10-22: 8 mg via ORAL

## 2011-10-22 MED ORDER — MOXIFLOXACIN HCL IN NACL 400 MG/250ML IV SOLN
400.0000 mg | INTRAVENOUS | Status: DC
Start: 2011-10-22 — End: 2011-10-24
  Administered 2011-10-22 – 2011-10-24 (×3): 400 mg via INTRAVENOUS
  Filled 2011-10-22 (×3): qty 250

## 2011-10-22 NOTE — Progress Notes (Signed)
RN noticed scabs and bruises on pt.'s knees and forehead. Ask pt. about the bruises he notify RN that he fell 2 weeks ago before surgery but he did not tell anyone. RN notified MD and will continue to monitor

## 2011-10-22 NOTE — Progress Notes (Signed)
Subjective: Patient reports Chase Evans lumbar back pain will posterior thigh pain no numbness or tingling still has some difficulty voiding well he feels it is any better he is low-frequency feelings in his bladder completely we had the in out cath last night will give him another chance this morning but he still retains 1 [address] place a catheter for a few days.  Objective: Vital signs in last 24 hours: Temp:  [98 F (36.7 C)-102.2 F (39 C)] 99.6 F (37.6 C) (10/05 0839) Pulse Rate:  [100-110] 107  (10/05 0839) Resp:  [18-20] 18  (10/05 0839) BP: (130-166)/(67-86) 152/73 mmHg (10/05 0839) SpO2:  [92 %-100 %] 92 % (10/05 0839)  Intake/Output from previous day: 10/04 0701 - 10/05 0700 In: 240 [P.O.:240] Out: 1225 [Urine:1225] Intake/Output this shift: Total I/O In: 240 [P.O.:240] Out: -   Strength out of 5 wound clean and dry  Lab Results:  [facility] 10/20/11 2119 10/20/11 0430  WBC 11.8* 7.9  HGB 11.2* 11.7*  HCT 33.2* 34.1*  PLT 148* 133*   BMET  Basename 10/20/11 0430  NA 138  K 4.6  CL 103  CO2 28  GLUCOSE 113*  BUN 25*  CREATININE 1.46*  CALCIUM 8.4    Studies/Results: Dg Chest Port 1 View  10/20/2011  *RADIOLOGY REPORT*  Clinical Data: Fever.  PORTABLE CHEST - 1 VIEW  Comparison: 10/19/2011  Findings: Stable asymmetric elevation of the right hemidiaphragm. The cardiopericardial silhouette is enlarged.  Mild vascular congestion without overt edema.  There is no focal airspace consolidation.  No pleural effusion. Imaged bony structures of the thorax are intact.  IMPRESSION: Low volumes with vascular congestion.  No edema or focal airspace consolidation.   Original Report Authenticated By: ERIC A. MANSELL, M.D.     Assessment/Plan: Posterior day 3 from a pleasant fever workup negative so far however still running low-grade temps urinary retention see Vaughan Basta may be improving with minimal margins this morning and he still retaining more than 300 we will replace a  catheter was transferred to 4 N., start him on Avelox for fevers areas blood cultures and urine cultures pending rest on a Medrol Dosepak for his radiculitis  LOS: 3 days     Luiza Carranco P 10/22/2011, 8:58 AM

## 2011-10-22 NOTE — Progress Notes (Signed)
Physical Therapy Treatment Patient Details Name: Chase Evans MRN: [id number] DOB: Jun 12, 1940 Today's Date: 10/22/2011 Time: [id number] PT Time Calculation (min): 38 min  PT Assessment / Plan / Recommendation Comments on Treatment Session  Pt s/p L5-S1decompression fusion who continues to be limited by pain with spasms with movement at times. Pt able to only recall 1/3 precautions and educated for all 3. Pt encouraged to continue ambulation with assist and use of RW not rollator at home with 24hr assist. Will continue to follow.     Follow Up Recommendations  Home health PT     Does the patient have the potential to tolerate intense rehabilitation     Barriers to Discharge        Equipment Recommendations       Recommendations for Other Services    Frequency     Plan Discharge plan remains appropriate;Frequency remains appropriate    Precautions / Restrictions Precautions Precautions: Back;Fall Spinal Brace: Lumbar corset;Applied in sitting position   Pertinent Vitals/Pain 2/10 at rest with periods of shooting to 7/10 down RLE with initial gait Premedicated, repositioned    Mobility  Bed Mobility Bed Mobility: Rolling Right Rolling Right: 5: Supervision Right Sidelying to Sit: 5: Supervision;HOB flat Details for Bed Mobility Assistance: cueing for sequence Transfers Sit to Stand: 4: Min assist;From bed Stand to Sit: 4: Min guard;To bed;To chair/3-in-1 Details for Transfer Assistance: min assist x 2 from chair and guarding with cueing for hand placement and sequence with each transfer Ambulation/Gait Ambulation/Gait Assistance: 4: Min guard Ambulation Distance (Feet): 100 Feet Assistive device: Rolling walker Ambulation/Gait Assistance Details: Pt initially walked 5 ft with repeated leg spasm with partial buckling, returned to sitting EOB. Second trial pt with very slow gait able to ambulate in hall decreased distance without partial buckling. Pt continues to  maintain hip flexion throughout gait with cueing for posture and stepping into RW. Gait Pattern: Decreased stride length;Shuffle;Trunk flexed Gait velocity: very slow grossly 3 min to amb 20' Stairs: No    Exercises     PT Diagnosis:    PT Problem List:   PT Treatment Interventions:     PT Goals Acute Rehab PT Goals PT Goal: Supine/Side to Sit - Progress: Progressing toward goal PT Goal: Sit to Stand - Progress: Progressing toward goal PT Goal: Ambulate - Progress: Progressing toward goal Additional Goals PT Goal: Additional Goal #1 - Progress: Progressing toward goal  Visit Information  Last PT Received On: 10/22/11 Assistance Needed: +1    Subjective Data  Subjective: I still have those spasms in my leg   Cognition  Overall Cognitive Status: Appears within functional limits for tasks assessed/performed Arousal/Alertness: Awake/alert Orientation Level: Appears intact for tasks assessed Behavior During Session: [facility] for tasks performed    Balance     End of Session PT - End of Session Equipment Utilized During Treatment: Back brace Activity Tolerance: Patient limited by pain Patient left: in chair;with call bell/phone within reach;with family/visitor present Nurse Communication: Mobility status   GP     Lanetta Inch [name] 10/22/2011, 8:15 AM [name], [place]

## 2011-10-23 NOTE — Progress Notes (Signed)
Physical Therapy Treatment Patient Details Name: Chase Evans MRN: [id number] DOB: 26-Jan-1940 Today's Date: 10/23/2011 Time: [id number] PT Time Calculation (min): 38 min  PT Assessment / Plan / Recommendation Comments on Treatment Session  Pt progressing well with treatment although pt still requires max cueing throughout session to maintain back precautions(mostly twisting) during all mobility. Pt completed stairs. Pt awaiting bladder function increasing prior to d/c home    Follow Up Recommendations  Home health PT     Does the patient have the potential to tolerate intense rehabilitation     Barriers to Discharge        Equipment Recommendations  None recommended by OT;None recommended by PT (family planning to purchase shower chair separately)    Recommendations for Other Services    Frequency Min 5X/week   Plan Discharge plan remains appropriate;Frequency remains appropriate    Precautions / Restrictions Precautions Precautions: Back;Fall Precaution Booklet Issued: Yes (comment) Precaution Comments: Required cueing to verbally recall 3/3 back precautions. Constant verbal cueing througout session to maintain back precautions. Required Braces or Orthoses: Spinal Brace Spinal Brace: Lumbar corset;Applied in sitting position Spinal Brace Comments: min assist for proper positioning of brace   Pertinent Vitals/Pain Pt with 5/10 pain.     Mobility  Bed Mobility Bed Mobility: Rolling Left;Left Sidelying to Sit;Sit to Sidelying Left;Sitting - Scoot to Edge of Bed Rolling Left: 5: Supervision Left Sidelying to Sit: 4: Min guard Sitting - Scoot to [employer] of Bed: 5: Supervision Sit to Sidelying Left: 4: Min assist;HOB flat Details for Bed Mobility Assistance: Assist for into bed due to pt attempting to twist and requiring support for LEs.  Max verbal cueing for technique. Transfers Transfers: Sit to Stand Sit to Stand: 4: Min assist;From bed Stand to Sit: 4: Min guard;To  bed Details for Transfer Assistance: min assist for stability and increased time to obtain full upright posture. Ambulation/Gait Ambulation/Gait Assistance: 4: Min guard Ambulation Distance (Feet): 300 Feet Assistive device: Rolling walker Ambulation/Gait Assistance Details: VC for safe distance to RW as well as to avoid twisting when looking around.  Gait Pattern: Decreased stride length;Shuffle;Trunk flexed Gait velocity: very slow grossly 3 min to amb 20' Stairs: Yes Stairs Assistance: 5: Supervision Stairs Assistance Details (indicate cue type and reason): VC for proper sequencing and safety.  Stair Management Technique: One rail Right Number of Stairs: 5     Exercises     PT Diagnosis:    PT Problem List:   PT Treatment Interventions:     PT Goals Acute Rehab PT Goals PT Goal: Supine/Side to Sit - Progress: Progressing toward goal PT Goal: Sit to Supine/Side - Progress: Progressing toward goal PT Goal: Sit to Stand - Progress: Progressing toward goal PT Goal: Ambulate - Progress: Progressing toward goal Additional Goals PT Goal: Additional Goal #1 - Progress: Progressing toward goal  Visit Information  Last PT Received On: 10/23/11 Assistance Needed: +1 PT/OT Co-Evaluation/Treatment: Yes    Subjective Data      Cognition  Overall Cognitive Status: Appears within functional limits for tasks assessed/performed Arousal/Alertness: Awake/alert Orientation Level: Appears intact for tasks assessed Behavior During Session: [facility] for tasks performed    Balance     End of Session PT - End of Session Equipment Utilized During Treatment: Back brace Activity Tolerance: Patient tolerated treatment well Patient left: in bed;with call bell/phone within reach;with family/visitor present Nurse Communication: Mobility status   GP     Ambrose Finland 10/23/2011, 3:20 PM

## 2011-10-23 NOTE — Progress Notes (Signed)
Occupational Therapy Treatment Patient Details Name: Chase Evans MRN: [id number] DOB: 29-Jan-1940 Today's Date: 10/23/2011 Time: [id number] OT Time Calculation (min): 38 min  OT Assessment / Plan / Recommendation Comments on Treatment Session Pt making progress but continues to require max cueing to maintain back precautions.  Would benefit from further AE education.      Follow Up Recommendations  Home health OT;Supervision/Assistance - 24 hour    Barriers to Discharge       Equipment Recommendations  None recommended by OT (family planning to purchase shower chair separately)    Recommendations for Other Services    Frequency Min 2X/week   Plan Discharge plan remains appropriate    Precautions / Restrictions Precautions Precautions: Back;Fall Precaution Booklet Issued: Yes (comment) Precaution Comments: Required cueing to verbally recall 3/3 back precautions. Constant verbal cueing througout session to maintain back precautions. Required Braces or Orthoses: Spinal Brace Spinal Brace: Lumbar corset;Applied in sitting position Spinal Brace Comments: min assist for proper positioning of brace   Pertinent Vitals/Pain See vitals    ADL  Lower Body Bathing: Simulated;Supervision/safety Where Assessed - Lower Body Bathing: Supine, head of bed up Upper Body Dressing: Performed;Independent Where Assessed - Upper Body Dressing: Unsupported sitting Lower Body Dressing: Simulated;Supervision/safety Where Assessed - Lower Body Dressing: Supine, head of bed up Toilet Transfer: Simulated;Minimal assistance Toilet Transfer Method: Sit to stand Toilet Transfer Equipment:  (bed to ambulate to therapy gym and then returned to bed) Tub/Shower Transfer: Performed;Min guard Tub/Shower Transfer Method: Ambulating Equipment Used: Back brace;Rolling walker Transfers/Ambulation Related to ADLs: min guard with RW ADL Comments: Educated pt and family on back precautions.  Pt with difficult  time adhering to precautions and frequently twists when sitting or standing.  Pt supine in bed with HOB elevated upon arrival and able to flex knees in order to access feet. Educated pt on using this method for LB dressing.  Discussed with pt use of AE (recalled from last session) but pt reports he isn't sure he likes it.  Pt's daughters-in-law report that pt has 3 bedside commodes but does not have a shower chair.  Family does not think that 3n1 will fit in pt's tub as shower chair. Pt does have walk in shower but it is being used for storage.  Educated pt's family on use of shower chair and availability, and they report they will plan to go to medical supply store to purchase one.  Pt performed tub transfer with max verbal cueing for safe and correct technique.     OT Diagnosis:    OT Problem List:   OT Treatment Interventions:     OT Goals ADL Goals Pt Will Perform Lower Body Bathing: with modified independence;Sit to stand in shower ADL Goal: Lower Body Bathing - Progress: Progressing toward goals Pt Will Perform Lower Body Dressing: with modified independence;Sit to stand from bed;Sit to stand from chair ADL Goal: Lower Body Dressing - Progress: Progressing toward goals Pt Will Transfer to Toilet: with modified independence;Ambulation;with DME ADL Goal: Toilet Transfer - Progress: Progressing toward goals Pt Will Perform Tub/Shower Transfer: Tub transfer;with supervision;Ambulation;with DME ADL Goal: Tub/Shower Transfer - Progress: Progressing toward goals Additional ADL Goal #1: Pt will verbalize/generalize 3/3 back precautions for use of functional activities. ADL Goal: Additional Goal #1 - Progress: Progressing toward goals Additional ADL Goal #2: Pt will I'ly don, doff and adjust brace as needed. ADL Goal: Additional Goal #2 - Progress: Progressing toward goals  Visit Information  Last OT Received On: 10/23/11 Assistance  Needed: +1    Subjective Data      Prior Functioning        Cognition  Overall Cognitive Status: Appears within functional limits for tasks assessed/performed Arousal/Alertness: Awake/alert Orientation Level: Appears intact for tasks assessed Behavior During Session: [facility] for tasks performed    Mobility  Shoulder Instructions Bed Mobility Bed Mobility: Rolling Left;Left Sidelying to Sit;Sit to Sidelying Left;Sitting - Scoot to Edge of Bed Rolling Left: 5: Supervision Left Sidelying to Sit: 4: Min guard Sitting - Scoot to [employer] of Bed: 5: Supervision Sit to Sidelying Left: 4: Min assist;HOB flat Details for Bed Mobility Assistance: Assist for into bed due to pt attempting to twist and requiring support for LEs.  Max verbal cueing for technique. Transfers Transfers: Sit to Stand;Stand to Sit Sit to Stand: 4: Min assist;From bed Stand to Sit: 4: Min guard;To bed Details for Transfer Assistance: min assist for stability and increased time to obtain full upright posture.       Exercises      Balance     End of Session OT - End of Session Equipment Utilized During Treatment: Gait belt;Back brace Activity Tolerance: Patient tolerated treatment well Patient left: in bed;with call bell/phone within reach;with family/visitor present Nurse Communication: Mobility status  GO   10/23/2011 Darrol Jump OTR/L Pager [phone] Office [phone]   Aleksandr, [place] 10/23/2011, 3:14 PM

## 2011-10-23 NOTE — Progress Notes (Signed)
Doing well. C/o appropriate incisional soreness. Less leg pain No Numbness, tingling, weakness No Nausea /vomiting Amb a little ,  Foley has been placed  Temp:  [97.7 F (36.5 C)-101.4 F (38.6 C)] 98.1 F (36.7 C) (10/06 0745) Pulse Rate:  [77-112] 78  (10/06 0745) Resp:  [18-20] 18  (10/06 0745) BP: (100-147)/(34-89) 139/69 mmHg (10/06 0745) SpO2:  [94 %-98 %] 96 % (10/06 0745) Weight:  [98.399 kg (216 lb 14.9 oz)] 98.399 kg (216 lb 14.9 oz) (10/05 1354) Good strength and sensation Incision CDI  Cx'x negative Plan: Temp normal 18 hours or so - on avelox , foley in place - CPM - increase activity

## 2011-10-24 MED ORDER — TAMSULOSIN HCL 0.4 MG PO CAPS: 0.4000 mg | ORAL_CAPSULE | Freq: Every day | ORAL | Status: DC

## 2011-10-24 MED ORDER — DIAZEPAM 5 MG PO TABS: 5.0000 mg | ORAL_TABLET | Freq: Four times a day (QID) | ORAL | Status: DC | PRN

## 2011-10-24 MED ORDER — HYDROMORPHONE HCL 2 MG PO TABS: 2.0000 mg | ORAL_TABLET | ORAL | Status: DC | PRN

## 2011-10-24 NOTE — Progress Notes (Signed)
Occupational Therapy Treatment Patient Details Name: Chase Evans MRN: [id number] DOB: 1940/07/31 Today's Date: 10/24/2011 Time: [id number] OT Time Calculation (min): 26 min            Plan Discharge plan remains appropriate    Precautions / Restrictions Precautions Precautions: Back Precaution Comments: Patient able to recall back precautions, requires cueing to maintain with mobility Required Braces or Orthoses: Spinal Brace Spinal Brace: Lumbar corset;Applied in sitting position       ADL  Lower Body Dressing: Performed;Minimal assistance Where Assessed - Lower Body Dressing: Unsupported sit to stand;Other (comment) (with AE) ADL Comments: Pt practiced with AE including reaching and sock aid.  Family will obtain is pt decides he wants them.  Educated pt on role of AE in maintaining back precautions.  Pt also agreed to move from bed to chair.  Encouraged pt to stay OOB as he had been in bed most of theday      OT Goals ADL Goals ADL Goal: Lower Body Dressing - Progress: Progressing toward goals ADL Goal: Additional Goal #1 - Progress: Progressing toward goals  Visit Information  Last OT Received On: 10/24/11          Cognition  Overall Cognitive Status: Appears within functional limits for tasks assessed/performed Arousal/Alertness: Awake/alert Orientation Level: Appears intact for tasks assessed Behavior During Session: [facility] for tasks performed    Mobility  Shoulder Instructions Bed Mobility Rolling Left: 5: Supervision Left Sidelying to Sit: 5: Supervision;HOB flat Sitting - Scoot to Edge of Bed: 5: Supervision Sit to Sidelying Right: 5: Supervision;HOB flat Sit to Sidelying Left: 5: Supervision;HOB flat Transfers Sit to Stand: From bed;4: Min guard Stand to Sit: 5: Supervision;To chair/3-in-1 Details for Transfer Assistance: Cues for safe hand placement             End of Session OT - End of Session Equipment Utilized During Treatment: Back  brace Activity Tolerance: Patient tolerated treatment well  GO     [place], Thereasa Parkin 10/24/2011, 3:08 PM

## 2011-10-24 NOTE — Discharge Summary (Signed)
Physician Discharge Summary  Patient ID: Chase Evans MRN: [id number] DOB/AGE: January 12, 1941 71 y.o.  Admit date: 10/19/2011 Discharge date: 10/24/2011  Admission Diagnoses: Lumbar spinal stenosis and degenerative disease Discharge Diagnoses: Same Active Problems:  * No active hospital problems. *    Discharged Condition: good  Hospital Course: Patient is been hospital underwent a lumbar fusion postoperatively did patient initially did very well recovered in the floor however start spiking low-grade temperature and had some difficulty with urinary retention his workup a low-grade temperature was negative chest x-ray showed some atelectasis but otherwise benign urine cultures blood cultures were all negative a used on Avelox he defervesced his urinary retention persisted L2 he had a catheter placed and he'll be discharged with catheter placed a fall with urology in approximately 1-2 weeks the at the time of discharge his angling placement and the catheter and tolerating the pain medication well the.  Consults: Significant Diagnostic Studies: Treatments: Posterior lumbar interbody fusion Discharge Exam: Blood pressure 117/54, pulse 85, temperature 97.9 F (36.6 C), temperature source Oral, resp. rate 18, height 5\' 7"  (1.702 m), weight 98.399 kg (216 lb 14.9 oz), SpO2 96.00%. Strength out of 5 wound clean dry  Disposition: Home  Discharge Orders    Future Orders Please Complete By Expires   Home Health      Questions: Responses:   To provide the following care/treatments PT    RN    OT   Face-to-face encounter      Comments:   I [name] P certify that this patient is under my care and that I, or a nurse practitioner or physician's assistant working with me, had a face-to-face encounter that meets the physician face-to-face encounter requirements with this patient on 10/24/2011.   Questions: Responses:   The encounter with the patient was in whole, or in part, for the  following medical condition, which is the primary reason for home health care lumbar stenosis   I certify that, based on my findings, the following services are medically necessary home health services Physical therapy   My clinical findings support the need for the above services OTHER SEE COMMENTS   Further, I certify that my clinical findings support that this patient is homebound due to: Unable to leave home safely without assistance   To provide the following care/treatments PT    RN    OT       Medication List     As of 10/24/2011  2:56 PM    TAKE these medications         ALEVE 220 MG tablet   Generic drug: naproxen sodium   Take 440 mg by mouth 2 (two) times daily as needed. For pain      aspirin 81 MG tablet   Take 81 mg by mouth daily.      diazepam 5 MG tablet   Commonly known as: VALIUM   Take 1 tablet (5 mg total) by mouth every 6 (six) hours as needed.      gabapentin 800 MG tablet   Commonly known as: NEURONTIN   Take 800 mg by mouth 3 (three) times daily.      HYDROmorphone 2 MG tablet   Commonly known as: DILAUDID   Take 1-2 tablets (2-4 mg total) by mouth every 4 (four) hours as needed for pain.      Tamsulosin HCl 0.4 MG Caps   Commonly known as: FLOMAX   Take 1 capsule (0.4 mg total) by mouth daily.  valsartan-hydrochlorothiazide 160-12.5 MG per tablet   Commonly known as: DIOVAN-HCT   Take 1 tablet by mouth daily.         Signed: Breely Panik P 10/24/2011, 2:56 PM

## 2011-10-24 NOTE — Progress Notes (Signed)
Subjective: Patient reports Please feeling well back and leg pain well-controlled still has a catheter in place is unable to void however we discussed going home with the catheter in place.  Objective: Vital signs in last 24 hours: Temp:  [97.9 F (36.6 C)-98.3 F (36.8 C)] 97.9 F (36.6 C) (10/07 1000) Pulse Rate:  [72-91] 85  (10/07 1000) Resp:  [18] 18  (10/07 1000) BP: (106-145)/(46-67) 117/54 mmHg (10/07 1000) SpO2:  [94 %-98 %] 96 % (10/07 1000)  Intake/Output from previous day: 10/06 0701 - 10/07 0700 In: 860 [P.O.:360; IV Piggyback:500] Out: 2500 [Urine:2500] Intake/Output this shift:    Strength out of 5 wound clean and dry  Lab Results: No results found for this basename: WBC:2,HGB:2,HCT:2,PLT:2 in the last 72 hours BMET No results found for this basename: NA:2,K:2,CL:2,CO2:2,GLUCOSE:2,BUN:2,CREATININE:2,CALCIUM:2 in the last 72 hours  Studies/Results: No results found.  Assessment/Plan: Discharge home with home health physical therapy and discharged with a Foley catheter  LOS: 5 days     Amedeo Detweiler P 10/24/2011, 2:48 PM

## 2011-10-24 NOTE — Progress Notes (Signed)
Physical Therapy Treatment Patient Details Name: Chase Evans MRN: [id number] DOB: March 11, 1940 Today's Date: 10/24/2011 Time: [id number] PT Time Calculation (min): 31 min  PT Assessment / Plan / Recommendation Comments on Treatment Session  Patient progressing well with mobility. Patient with decreased gait speed and tendency to lean forward onto RW. Cues for back precautions with mobilty. Patient awaiting bladder function increase prior to DC home. Encouraged patient to ambulate with staff as able.     Follow Up Recommendations  Home health PT     Does the patient have the potential to tolerate intense rehabilitation     Barriers to Discharge        Equipment Recommendations  None recommended by PT;None recommended by OT    Recommendations for Other Services    Frequency Min 5X/week   Plan Discharge plan remains appropriate;Frequency remains appropriate    Precautions / Restrictions Precautions Precautions: Back Precaution Comments: Patient able to recall back precautions, requires cueing to maintain with mobility Required Braces or Orthoses: Spinal Brace Spinal Brace: Lumbar corset;Applied in sitting position   Pertinent Vitals/Pain     Mobility  Bed Mobility Rolling Left: 5: Supervision Left Sidelying to Sit: 5: Supervision;HOB flat Sitting - Scoot to Edge of Bed: 5: Supervision Sit to Sidelying Right: 5: Supervision;HOB flat Sit to Sidelying Left: 5: Supervision;HOB flat Details for Bed Mobility Assistance: Patient required cues for positioning and to prevent twisting Transfers Sit to Stand: From bed;4: Min guard Stand to Sit: 5: Supervision;To bed Details for Transfer Assistance: Cues for safe hand placement Ambulation/Gait Ambulation/Gait Assistance: 4: Min guard Ambulation Distance (Feet): 600 Feet Assistive device: Rolling walker Ambulation/Gait Assistance Details: Cues for patient to stand upright and within RW. Adjusted height of RW Gait Pattern:  Step-through pattern;Decreased stride length;Trunk flexed Gait velocity: decreased Stairs Assistance: 5: Supervision Stair Management Technique: One rail Right Number of Stairs: 5     Exercises     PT Diagnosis:    PT Problem List:   PT Treatment Interventions:     PT Goals Acute Rehab PT Goals PT Goal: Supine/Side to Sit - Progress: Progressing toward goal PT Goal: Sit to Supine/Side - Progress: Progressing toward goal PT Goal: Sit to Stand - Progress: Progressing toward goal PT Goal: Ambulate - Progress: Progressing toward goal Additional Goals PT Goal: Additional Goal #1 - Progress: Progressing toward goal  Visit Information  Last PT Received On: 10/24/11 Assistance Needed: +1    Subjective Data      Cognition  Overall Cognitive Status: Appears within functional limits for tasks assessed/performed Arousal/Alertness: Awake/alert Orientation Level: Appears intact for tasks assessed Behavior During Session: [facility] for tasks performed    Balance     End of Session PT - End of Session Equipment Utilized During Treatment: Gait belt;Back brace Activity Tolerance: Patient tolerated treatment well Patient left: in bed;with call bell/phone within reach Nurse Communication: Mobility status   GP     Jacqualyn Posey 10/24/2011, 8:58 AM  10/24/2011 Jacqualyn Posey PTA [phone] pager [phone] office

## 2011-10-27 LAB — CULTURE, BLOOD (ROUTINE X 2)
Culture: NO GROWTH
Culture: NO GROWTH

## 2011-12-22 ENCOUNTER — Other Ambulatory Visit: Payer: Self-pay | Admitting: Neurosurgery

## 2011-12-22 DIAGNOSIS — M549 Dorsalgia, unspecified: Secondary | ICD-10-CM

## 2011-12-23 ENCOUNTER — Other Ambulatory Visit: Payer: Self-pay | Admitting: Neurosurgery

## 2011-12-23 DIAGNOSIS — M549 Dorsalgia, unspecified: Secondary | ICD-10-CM

## 2011-12-30 ENCOUNTER — Ambulatory Visit
Admission: RE | Admit: 2011-12-30 | Discharge: 2011-12-30 | Disposition: A | Discharge status: Home / Self Care | Payer: Medicare Other | Source: Ambulatory Visit | Attending: Neurosurgery | Admitting: Neurosurgery

## 2011-12-30 DIAGNOSIS — M549 Dorsalgia, unspecified: Secondary | ICD-10-CM

## 2011-12-30 MED ORDER — GADOBENATE DIMEGLUMINE 529 MG/ML IV SOLN
6.0000 mL | Freq: Once | INTRAVENOUS | Status: AC | PRN
  Administered 2011-12-30: 6 mL via INTRAVENOUS

## 2015-02-24 DIAGNOSIS — N183 Chronic kidney disease, stage 3 unspecified: Secondary | ICD-10-CM | POA: Insufficient documentation

## 2015-02-24 DIAGNOSIS — I129 Hypertensive chronic kidney disease with stage 1 through stage 4 chronic kidney disease, or unspecified chronic kidney disease: Secondary | ICD-10-CM | POA: Insufficient documentation

## 2015-05-12 ENCOUNTER — Other Ambulatory Visit: Payer: Self-pay

## 2016-09-29 ENCOUNTER — Other Ambulatory Visit: Payer: Self-pay | Admitting: Neurosurgery

## 2016-09-29 DIAGNOSIS — G9519 Other vascular myelopathies: Secondary | ICD-10-CM

## 2016-09-29 DIAGNOSIS — M48062 Spinal stenosis, lumbar region with neurogenic claudication: Secondary | ICD-10-CM

## 2016-10-11 ENCOUNTER — Ambulatory Visit
Admission: RE | Admit: 2016-10-11 | Discharge: 2016-10-11 | Disposition: A | Discharge status: Home / Self Care | Payer: Medicare Other | Source: Ambulatory Visit | Attending: Neurosurgery | Admitting: Neurosurgery

## 2016-10-11 DIAGNOSIS — G9519 Other vascular myelopathies: Secondary | ICD-10-CM

## 2016-10-11 DIAGNOSIS — M48062 Spinal stenosis, lumbar region with neurogenic claudication: Secondary | ICD-10-CM

## 2016-10-11 MED ORDER — IOPAMIDOL (ISOVUE-M 200) INJECTION 41%
15.0000 mL | Freq: Once | INTRAMUSCULAR | Status: AC
  Administered 2016-10-11: 15 mL via INTRATHECAL

## 2016-10-11 MED ORDER — DIAZEPAM 5 MG PO TABS
5.0000 mg | ORAL_TABLET | Freq: Once | ORAL | Status: AC
  Administered 2016-10-11: 5 mg via ORAL

## 2016-10-11 NOTE — Discharge Instructions (Signed)

## 2017-01-06 DIAGNOSIS — R001 Bradycardia, unspecified: Secondary | ICD-10-CM

## 2017-01-06 DIAGNOSIS — R55 Syncope and collapse: Secondary | ICD-10-CM

## 2017-01-06 DIAGNOSIS — N183 Chronic kidney disease, stage 3 (moderate): Secondary | ICD-10-CM

## 2017-01-06 DIAGNOSIS — I129 Hypertensive chronic kidney disease with stage 1 through stage 4 chronic kidney disease, or unspecified chronic kidney disease: Secondary | ICD-10-CM

## 2017-01-06 DIAGNOSIS — R911 Solitary pulmonary nodule: Secondary | ICD-10-CM | POA: Diagnosis not present

## 2017-01-07 DIAGNOSIS — R911 Solitary pulmonary nodule: Secondary | ICD-10-CM | POA: Diagnosis not present

## 2017-01-07 DIAGNOSIS — R0602 Shortness of breath: Secondary | ICD-10-CM | POA: Diagnosis not present

## 2017-01-07 DIAGNOSIS — N183 Chronic kidney disease, stage 3 (moderate): Secondary | ICD-10-CM | POA: Diagnosis not present

## 2017-01-07 DIAGNOSIS — R55 Syncope and collapse: Secondary | ICD-10-CM | POA: Diagnosis not present

## 2017-01-07 DIAGNOSIS — I129 Hypertensive chronic kidney disease with stage 1 through stage 4 chronic kidney disease, or unspecified chronic kidney disease: Secondary | ICD-10-CM | POA: Diagnosis not present

## 2017-01-07 DIAGNOSIS — R001 Bradycardia, unspecified: Secondary | ICD-10-CM | POA: Diagnosis not present

## 2017-01-12 DIAGNOSIS — R001 Bradycardia, unspecified: Secondary | ICD-10-CM | POA: Insufficient documentation

## 2017-01-18 ENCOUNTER — Encounter: Payer: Self-pay | Admitting: Cardiology

## 2017-01-18 ENCOUNTER — Ambulatory Visit (INDEPENDENT_AMBULATORY_CARE_PROVIDER_SITE_OTHER): Payer: Medicare Other | Admitting: Cardiology

## 2017-01-18 VITALS — BP 114/66 | HR 69 | Ht 66.0 in | Wt 188.1 lb

## 2017-01-18 DIAGNOSIS — E785 Hyperlipidemia, unspecified: Secondary | ICD-10-CM | POA: Diagnosis not present

## 2017-01-18 DIAGNOSIS — N183 Chronic kidney disease, stage 3 (moderate): Secondary | ICD-10-CM

## 2017-01-18 DIAGNOSIS — I129 Hypertensive chronic kidney disease with stage 1 through stage 4 chronic kidney disease, or unspecified chronic kidney disease: Secondary | ICD-10-CM

## 2017-01-18 DIAGNOSIS — R55 Syncope and collapse: Secondary | ICD-10-CM

## 2017-01-18 DIAGNOSIS — R001 Bradycardia, unspecified: Secondary | ICD-10-CM | POA: Diagnosis not present

## 2017-01-18 NOTE — Patient Instructions (Signed)
Medication Instructions:  Your physician has recommended you make the following change in your medication:  STOP metoprolol  Labwork: Your physician recommends that you have the following labs drawn: TSH and liver/lipid panel  Testing/Procedures: Your physician has requested that you have a lexiscan myoview. For further information please visit [email or web address]. Please follow instruction sheet, as given.  Your physician has recommended that you wear a holter monitor. Holter monitors are medical devices that record the heart's electrical activity. Doctors most often use these monitors to diagnose arrhythmias. Arrhythmias are problems with the speed or rhythm of the heartbeat. The monitor is a small, portable device. You can wear one while you do your normal daily activities. This is usually used to diagnose what is causing palpitations/syncope (passing out).   Follow-Up: Your physician recommends that you schedule a follow-up appointment in: 3 months  Any Other Special Instructions Will Be Listed Below (If Applicable).     If you need a refill on your cardiac medications before your next appointment, please call your pharmacy.   [place], RN, BSN   Holter Monitoring A Holter monitor is a small device that is used to detect abnormal heart rhythms. It clips to your clothing and is connected by wires to flat, sticky disks (electrodes) that attach to your chest. It is worn continuously for 24-48 hours. Follow these instructions at home:  Wear your Holter monitor at all times, even while exercising and sleeping, for as long as directed by your health care provider.  Make sure that the Holter monitor is safely clipped to your clothing or close to your body as recommended by your health care provider.  Do not get the monitor or wires wet.  Do not put body lotion or moisturizer on your chest.  Keep your skin clean.  Keep a diary of your daily activities, such as  walking and doing chores. If you feel that your heartbeat is abnormal or that your heart is fluttering or skipping a beat: ? Record what you are doing when it happens. ? Record what time of day the symptoms occur.  Return your Holter monitor as directed by your health care provider.  Keep all follow-up visits as directed by your health care provider. This is important. Get help right away if:  You feel lightheaded or you faint.  You have trouble breathing.  You feel pain in your chest, upper arm, or jaw.  You feel sick to your stomach and your skin is pale, cool, or damp.  You heartbeat feels unusual or abnormal. This information is not intended to replace advice given to you by your health care provider. Make sure you discuss any questions you have with your health care provider. Document Released: 10/02/2003 Document Revised: 06/11/2015 Document Reviewed: 08/12/2013 Elsevier Interactive Patient Education  [employer].

## 2017-01-18 NOTE — Progress Notes (Signed)
Cardiology Office Note:    Date:  01/18/2017   ID:  Chase Evans, DOB 08-Dec-1940, MRN [id number]  PCP:  Allean Found, MD  Cardiologist:  Jenean Lindau, MD   Referring MD: No ref. provider found    ASSESSMENT:    1. Syncope and collapse   2. Hyperlipidemia, unspecified hyperlipidemia type   3. Benign hypertension with chronic kidney disease, stage III ([place])   4. Bradycardia    PLAN:    In order of problems listed above:  1. Primary prevention discussed with the patient at length.  Importance of compliance with diet and medications stressed.  CT scan of his brain reveals microvascular chronic changes and I will assess his lipids for evaluation for statin therapy. 2. The patient's heart rate and blood pressures are borderline.  In view of his significant syncopal episode and relative bradycardia, I feel asked him to stop his beta-blocker at this time.  His TSH was unremarkable.  Will obtain a 48-hour Holter monitor.  His echocardiogram done at the hospital was unremarkable also.  Hopefully stopping the beta-blocker will get his heart rate and blood pressure elevated some and this may be better for him in view of his symptoms and the fact that he is 77 years old. 3. Lexiscan sestamibi will be done to assess evidence of coronary artery disease.  He is an elderly gentleman and leads a sedentary lifestyle. 4. Renal insufficiency issues were addressed and this will be handled by his primary care physician.  I will check his lipids. 5. Patient will be seen in follow-up appointment in 3 months or earlier if the patient has any concerns    Medication Adjustments/Labs and Tests Ordered: Current medicines are reviewed at length with the patient today.  Concerns regarding medicines are outlined above.  Orders Placed This Encounter  Procedures  . TSH  . Hepatic function panel  . Lipid panel  . Holter monitor - 48 hour  . MYOCARDIAL PERFUSION IMAGING   No orders of the defined  types were placed in this encounter.    History of Present Illness:    Chase Evans is a 77 y.o. male who is being seen today for the evaluation of syncope at the request of No ref. provider found.  Patient is a pleasant 77 year old male.  He is previously unknown to me.  He has past medical history of essential and renal insufficiency.  For the first time he went to [facility] because he experienced a syncopal spell.  No chest pain orthopnea or PND.  He leads a sedentary lifestyle.  He has never had a syncopal spell before.  He denies any chest pain orthopnea or PND.  At the time of my evaluation, the patient is alert awake oriented and in no distress.  Patient mentions to me that he does not remember anything related to the syncope.  The first time he woke up was when he found himself in the ambulance.  His daughter-in-law is very supportive and accompanies him.  Past Medical History:  Diagnosis Date  . Arthritis   . BPH (benign prostatic hyperplasia)   . Family history of anesthesia complication    mother N/V  . Hyperlipemia   . Hypertension   . TIA (transient ischemic attack)     Past Surgical History:  Procedure Laterality Date  . BACK SURGERY  05/12/08  . CERVICAL DISC SURGERY      Current Medications: Current Meds  Medication Sig  . acetaminophen (TYLENOL) 650  MG CR tablet Take by mouth.  Marland Kitchen allopurinol (ZYLOPRIM) 300 MG tablet Take 300 mg by mouth daily.   Marland Kitchen amLODipine (NORVASC) 10 MG tablet Take 10 mg by mouth daily.   Marland Kitchen aspirin 81 MG tablet Take 81 mg by mouth daily.  . clopidogrel (PLAVIX) 75 MG tablet Take 75 mg by mouth daily.  . fluconazole (DIFLUCAN) 150 MG tablet Take 150 mg by mouth daily.  . furosemide (LASIX) 20 MG tablet Take 20 mg by mouth 2 (two) times daily.   Marland Kitchen gabapentin (NEURONTIN) 600 MG tablet Take 600 mg by mouth 3 (three) times daily.   . hydrALAZINE (APRESOLINE) 25 MG tablet Take 25 mg by mouth 3 (three) times daily.  . Omega-3 Fatty  Acids (FISH OIL) 1000 MG CAPS Take by mouth 2 (two) times daily.  Marland Kitchen terbinafine (LAMISIL) 250 MG tablet Take 250 mg by mouth every other day.  . [DISCONTINUED] metoprolol tartrate (LOPRESSOR) 25 MG tablet Take 25 mg by mouth 2 (two) times daily.     Allergies:   Nsaids; Penicillins; Percocet [oxycodone-acetaminophen]; and Crestor [rosuvastatin]   Social History   Socioeconomic History  . Marital status: Married    Spouse name: None  . Number of children: None  . Years of education: None  . Highest education level: None  Social Needs  . Financial resource strain: None  . Food insecurity - worry: None  . Food insecurity - inability: None  . Transportation needs - medical: None  . Transportation needs - non-medical: None  Occupational History  . None  Tobacco Use  . Smoking status: Former Smoker    Years: 4.00    Last attempt to quit: 01/17/1973    Years since quitting: 44.0  . Smokeless tobacco: Never Used  Substance and Sexual Activity  . Alcohol use: No  . Drug use: No  . Sexual activity: None  Other Topics Concern  . None  Social History Narrative  . None     Family History: The patient's family history includes Diabetes in his mother; Heart attack in his mother; Heart disease in his mother.  ROS:   Please see the history of present illness.    All other systems reviewed and are negative.  EKGs/Labs/Other Studies Reviewed:    The following studies were reviewed today: I reviewed records from hospital extensively.  CT scan, lab work and EKG was also reviewed.  EKG reveals sinus bradycardia.  He was fairly bradycardic when he came to the emergency room.  Echocardiogram report was also discussed after reviewing with him.   Recent Labs: No results found for requested labs within last 8760 hours.  Recent Lipid Panel No results found for: CHOL, TRIG, HDL, CHOLHDL, VLDL, LDLCALC, LDLDIRECT  Physical Exam:    VS:  BP 114/66 (BP Location: Left Arm, Patient Position:  Sitting)   Pulse 69   Ht 5\' 6"  (1.676 m)   Wt 188 lb 1.9 oz (85.3 kg)   SpO2 96%   BMI 30.36 kg/m     Wt Readings from Last 3 Encounters:  01/18/17 188 lb 1.9 oz (85.3 kg)  10/22/11 216 lb 14.9 oz (98.4 kg)  09/13/11 208 lb (94.3 kg)     GEN: Patient is in no acute distress HEENT: Normal NECK: No JVD; No carotid bruits LYMPHATICS: No lymphadenopathy CARDIAC: S1 S2 regular, 2/6 systolic murmur at the apex. RESPIRATORY:  Clear to auscultation without rales, wheezing or rhonchi  ABDOMEN: Soft, non-tender, non-distended MUSCULOSKELETAL:  No edema; No deformity  SKIN: Warm and dry NEUROLOGIC:  Alert and oriented x 3 PSYCHIATRIC:  Normal affect    Signed, Jenean Lindau, MD  01/18/2017 10:28 AM    [facility]

## 2017-01-19 LAB — LIPID PANEL
Chol/HDL Ratio: 4.6 ratio (ref 0.0–5.0)
Cholesterol, Total: 223 mg/dL — ABNORMAL HIGH (ref 100–199)
HDL: 49 mg/dL (ref >39)
LDL CALC: 150 mg/dL — AB (ref 0–99)
Triglycerides: 118 mg/dL (ref 0–149)
VLDL CHOLESTEROL CAL: 24 mg/dL (ref 5–40)

## 2017-01-19 LAB — HEPATIC FUNCTION PANEL
ALK PHOS: 57 IU/L (ref 39–117)
ALT: 12 IU/L (ref 0–44)
AST: 17 IU/L (ref 0–40)
Albumin: 4.5 g/dL (ref 3.5–4.8)
BILIRUBIN TOTAL: 0.3 mg/dL (ref 0.0–1.2)
Bilirubin, Direct: 0.1 mg/dL (ref 0.00–0.40)
TOTAL PROTEIN: 7 g/dL (ref 6.0–8.5)

## 2017-01-19 LAB — TSH: TSH: 4.97 u[IU]/mL — AB (ref 0.450–4.500)

## 2017-01-19 MED ORDER — ATORVASTATIN CALCIUM 10 MG PO TABS: 10.0000 mg | ORAL_TABLET | Freq: Every day | ORAL | 0 refills | Status: DC

## 2017-01-19 NOTE — Addendum Note (Signed)
Addended by: Mattie Marlin on: 01/19/2017 01:23 PM   Modules accepted: Orders

## 2017-01-23 ENCOUNTER — Other Ambulatory Visit: Payer: Self-pay

## 2017-01-23 ENCOUNTER — Telehealth: Payer: Self-pay | Admitting: Cardiology

## 2017-01-23 ENCOUNTER — Telehealth (HOSPITAL_COMMUNITY): Payer: Self-pay | Admitting: *Deleted

## 2017-01-23 MED ORDER — HYDRALAZINE HCL 25 MG PO TABS: 25.0000 mg | ORAL_TABLET | Freq: Three times a day (TID) | ORAL | 1 refills | Status: DC

## 2017-01-23 MED ORDER — AMLODIPINE BESYLATE 5 MG PO TABS: 10.0000 mg | ORAL_TABLET | Freq: Every day | ORAL | 1 refills | Status: DC

## 2017-01-23 NOTE — Telephone Encounter (Signed)
Refill sent.

## 2017-01-23 NOTE — Telephone Encounter (Signed)
Patient given detailed instructions per Myocardial Perfusion Study Information Sheet for the test on 01/25/17 at 0715. Patient notified to arrive 15 minutes early and that it is imperative to arrive on time for appointment to keep from having the test rescheduled.  If you need to cancel or reschedule your appointment, please call the office within 24 hours of your appointment. . Patient verbalized understanding.Chase Evans, [name]

## 2017-01-23 NOTE — Telephone Encounter (Signed)
Pharmacy calling for refills of amlodipine 5 mg and hydralazine 25mg .

## 2017-01-25 ENCOUNTER — Ambulatory Visit (HOSPITAL_COMMUNITY): Payer: Medicare Other | Attending: Cardiovascular Disease

## 2017-01-25 ENCOUNTER — Encounter (HOSPITAL_COMMUNITY): Payer: Medicare Other

## 2017-01-25 ENCOUNTER — Ambulatory Visit (INDEPENDENT_AMBULATORY_CARE_PROVIDER_SITE_OTHER): Payer: Medicare Other

## 2017-01-25 DIAGNOSIS — R55 Syncope and collapse: Secondary | ICD-10-CM

## 2017-01-25 DIAGNOSIS — I2510 Atherosclerotic heart disease of native coronary artery without angina pectoris: Secondary | ICD-10-CM | POA: Diagnosis not present

## 2017-01-25 DIAGNOSIS — I10 Essential (primary) hypertension: Secondary | ICD-10-CM | POA: Diagnosis not present

## 2017-01-25 DIAGNOSIS — R0609 Other forms of dyspnea: Secondary | ICD-10-CM | POA: Insufficient documentation

## 2017-01-25 LAB — MYOCARDIAL PERFUSION IMAGING
CSEPPHR: 98 {beats}/min
LV sys vol: 32 mL
LVDIAVOL: 107 mL (ref 62–150)
RATE: 0.31
Rest HR: 77 {beats}/min
SDS: 3
SRS: 3
SSS: 6
TID: 1.03

## 2017-01-25 MED ORDER — TECHNETIUM TC 99M TETROFOSMIN IV KIT
31.6000 | PACK | Freq: Once | INTRAVENOUS | Status: AC | PRN
  Administered 2017-01-25: 31.6 via INTRAVENOUS
  Filled 2017-01-25: qty 32

## 2017-01-25 MED ORDER — REGADENOSON 0.4 MG/5ML IV SOLN
0.4000 mg | Freq: Once | INTRAVENOUS | Status: AC
  Administered 2017-01-25: 0.4 mg via INTRAVENOUS

## 2017-01-25 MED ORDER — TECHNETIUM TC 99M TETROFOSMIN IV KIT
10.4000 | PACK | Freq: Once | INTRAVENOUS | Status: AC | PRN
  Administered 2017-01-25: 10.4 via INTRAVENOUS
  Filled 2017-01-25: qty 11

## 2017-01-27 ENCOUNTER — Telehealth: Payer: Self-pay | Admitting: Cardiology

## 2017-01-27 NOTE — Telephone Encounter (Signed)
Patient has been waiting on daughter to bring monitor. Patient states that his heart rate is 130. Per Dr. Geraldo Pitter the patient was informed to go to his PCP or urgent care asap.

## 2017-01-27 NOTE — Telephone Encounter (Signed)
Patient states that he is feeling well. He is turning in the monitor today after lunch; will request the report asap.

## 2017-01-27 NOTE — Telephone Encounter (Signed)
Please call patient, due to have heart monitor off today, but pulse got up to 188 and now down to 130

## 2017-01-31 ENCOUNTER — Telehealth: Payer: Self-pay

## 2017-01-31 NOTE — Telephone Encounter (Signed)
Attempted to call the patient's cell, home and son to discuss concerning holter monitor reports. No answer and was unable to leave voicemail. Will try again later.

## 2017-02-03 ENCOUNTER — Telehealth: Payer: Self-pay | Admitting: Cardiology

## 2017-02-03 ENCOUNTER — Other Ambulatory Visit: Payer: Self-pay

## 2017-02-03 DIAGNOSIS — R9431 Abnormal electrocardiogram [ECG] [EKG]: Secondary | ICD-10-CM

## 2017-02-03 NOTE — Telephone Encounter (Signed)
Informed patient of abnormal monitor report. Attempted to call the patient before with no answer. Informed patient that he will be referred to EP.

## 2017-02-03 NOTE — Telephone Encounter (Signed)
Patient is wondering if they have any results from him monitor yet?

## 2017-02-14 DIAGNOSIS — R911 Solitary pulmonary nodule: Secondary | ICD-10-CM | POA: Diagnosis not present

## 2017-02-14 DIAGNOSIS — M109 Gout, unspecified: Secondary | ICD-10-CM | POA: Diagnosis not present

## 2017-02-14 DIAGNOSIS — M1990 Unspecified osteoarthritis, unspecified site: Secondary | ICD-10-CM | POA: Diagnosis not present

## 2017-02-14 DIAGNOSIS — I129 Hypertensive chronic kidney disease with stage 1 through stage 4 chronic kidney disease, or unspecified chronic kidney disease: Secondary | ICD-10-CM | POA: Diagnosis not present

## 2017-02-14 DIAGNOSIS — J189 Pneumonia, unspecified organism: Secondary | ICD-10-CM

## 2017-02-14 DIAGNOSIS — I248 Other forms of acute ischemic heart disease: Secondary | ICD-10-CM | POA: Diagnosis not present

## 2017-02-14 DIAGNOSIS — N183 Chronic kidney disease, stage 3 (moderate): Secondary | ICD-10-CM | POA: Diagnosis not present

## 2017-02-14 DIAGNOSIS — N179 Acute kidney failure, unspecified: Secondary | ICD-10-CM

## 2017-02-14 DIAGNOSIS — J9601 Acute respiratory failure with hypoxia: Secondary | ICD-10-CM

## 2017-02-14 DIAGNOSIS — R05 Cough: Secondary | ICD-10-CM

## 2017-02-15 DIAGNOSIS — R05 Cough: Secondary | ICD-10-CM | POA: Diagnosis not present

## 2017-02-15 DIAGNOSIS — N183 Chronic kidney disease, stage 3 (moderate): Secondary | ICD-10-CM | POA: Diagnosis not present

## 2017-02-15 DIAGNOSIS — M1990 Unspecified osteoarthritis, unspecified site: Secondary | ICD-10-CM | POA: Diagnosis not present

## 2017-02-15 DIAGNOSIS — I248 Other forms of acute ischemic heart disease: Secondary | ICD-10-CM | POA: Diagnosis not present

## 2017-02-15 DIAGNOSIS — N179 Acute kidney failure, unspecified: Secondary | ICD-10-CM | POA: Diagnosis not present

## 2017-02-15 DIAGNOSIS — R911 Solitary pulmonary nodule: Secondary | ICD-10-CM | POA: Diagnosis not present

## 2017-02-15 DIAGNOSIS — J189 Pneumonia, unspecified organism: Secondary | ICD-10-CM | POA: Diagnosis not present

## 2017-02-15 DIAGNOSIS — J9601 Acute respiratory failure with hypoxia: Secondary | ICD-10-CM | POA: Diagnosis not present

## 2017-02-15 DIAGNOSIS — I129 Hypertensive chronic kidney disease with stage 1 through stage 4 chronic kidney disease, or unspecified chronic kidney disease: Secondary | ICD-10-CM | POA: Diagnosis not present

## 2017-02-15 DIAGNOSIS — M109 Gout, unspecified: Secondary | ICD-10-CM | POA: Diagnosis not present

## 2017-02-16 DIAGNOSIS — M109 Gout, unspecified: Secondary | ICD-10-CM | POA: Diagnosis not present

## 2017-02-16 DIAGNOSIS — N179 Acute kidney failure, unspecified: Secondary | ICD-10-CM | POA: Diagnosis not present

## 2017-02-16 DIAGNOSIS — J189 Pneumonia, unspecified organism: Secondary | ICD-10-CM | POA: Diagnosis not present

## 2017-02-16 DIAGNOSIS — M1990 Unspecified osteoarthritis, unspecified site: Secondary | ICD-10-CM | POA: Diagnosis not present

## 2017-02-16 DIAGNOSIS — I248 Other forms of acute ischemic heart disease: Secondary | ICD-10-CM | POA: Diagnosis not present

## 2017-02-16 DIAGNOSIS — N183 Chronic kidney disease, stage 3 (moderate): Secondary | ICD-10-CM | POA: Diagnosis not present

## 2017-02-16 DIAGNOSIS — R05 Cough: Secondary | ICD-10-CM | POA: Diagnosis not present

## 2017-02-16 DIAGNOSIS — J9601 Acute respiratory failure with hypoxia: Secondary | ICD-10-CM | POA: Diagnosis not present

## 2017-02-16 DIAGNOSIS — R911 Solitary pulmonary nodule: Secondary | ICD-10-CM | POA: Diagnosis not present

## 2017-02-16 DIAGNOSIS — I129 Hypertensive chronic kidney disease with stage 1 through stage 4 chronic kidney disease, or unspecified chronic kidney disease: Secondary | ICD-10-CM | POA: Diagnosis not present

## 2017-02-17 DIAGNOSIS — J189 Pneumonia, unspecified organism: Secondary | ICD-10-CM | POA: Diagnosis not present

## 2017-02-17 DIAGNOSIS — I248 Other forms of acute ischemic heart disease: Secondary | ICD-10-CM | POA: Diagnosis not present

## 2017-02-17 DIAGNOSIS — R05 Cough: Secondary | ICD-10-CM | POA: Diagnosis not present

## 2017-02-17 DIAGNOSIS — I129 Hypertensive chronic kidney disease with stage 1 through stage 4 chronic kidney disease, or unspecified chronic kidney disease: Secondary | ICD-10-CM | POA: Diagnosis not present

## 2017-02-17 DIAGNOSIS — R911 Solitary pulmonary nodule: Secondary | ICD-10-CM | POA: Diagnosis not present

## 2017-02-17 DIAGNOSIS — M1990 Unspecified osteoarthritis, unspecified site: Secondary | ICD-10-CM | POA: Diagnosis not present

## 2017-02-17 DIAGNOSIS — N179 Acute kidney failure, unspecified: Secondary | ICD-10-CM | POA: Diagnosis not present

## 2017-02-17 DIAGNOSIS — J9601 Acute respiratory failure with hypoxia: Secondary | ICD-10-CM | POA: Diagnosis not present

## 2017-02-17 DIAGNOSIS — M109 Gout, unspecified: Secondary | ICD-10-CM | POA: Diagnosis not present

## 2017-02-17 DIAGNOSIS — N183 Chronic kidney disease, stage 3 (moderate): Secondary | ICD-10-CM | POA: Diagnosis not present

## 2017-02-18 DIAGNOSIS — R911 Solitary pulmonary nodule: Secondary | ICD-10-CM | POA: Diagnosis not present

## 2017-02-18 DIAGNOSIS — M109 Gout, unspecified: Secondary | ICD-10-CM | POA: Diagnosis not present

## 2017-02-18 DIAGNOSIS — N183 Chronic kidney disease, stage 3 (moderate): Secondary | ICD-10-CM | POA: Diagnosis not present

## 2017-02-18 DIAGNOSIS — I248 Other forms of acute ischemic heart disease: Secondary | ICD-10-CM | POA: Diagnosis not present

## 2017-02-18 DIAGNOSIS — J189 Pneumonia, unspecified organism: Secondary | ICD-10-CM | POA: Diagnosis not present

## 2017-02-18 DIAGNOSIS — J9601 Acute respiratory failure with hypoxia: Secondary | ICD-10-CM | POA: Diagnosis not present

## 2017-02-18 DIAGNOSIS — I129 Hypertensive chronic kidney disease with stage 1 through stage 4 chronic kidney disease, or unspecified chronic kidney disease: Secondary | ICD-10-CM | POA: Diagnosis not present

## 2017-02-18 DIAGNOSIS — N179 Acute kidney failure, unspecified: Secondary | ICD-10-CM | POA: Diagnosis not present

## 2017-02-18 DIAGNOSIS — R05 Cough: Secondary | ICD-10-CM | POA: Diagnosis not present

## 2017-02-18 DIAGNOSIS — M1990 Unspecified osteoarthritis, unspecified site: Secondary | ICD-10-CM | POA: Diagnosis not present

## 2017-02-19 DIAGNOSIS — J189 Pneumonia, unspecified organism: Secondary | ICD-10-CM | POA: Diagnosis not present

## 2017-02-19 DIAGNOSIS — N179 Acute kidney failure, unspecified: Secondary | ICD-10-CM | POA: Diagnosis not present

## 2017-02-19 DIAGNOSIS — I129 Hypertensive chronic kidney disease with stage 1 through stage 4 chronic kidney disease, or unspecified chronic kidney disease: Secondary | ICD-10-CM | POA: Diagnosis not present

## 2017-02-19 DIAGNOSIS — I248 Other forms of acute ischemic heart disease: Secondary | ICD-10-CM | POA: Diagnosis not present

## 2017-02-19 DIAGNOSIS — M109 Gout, unspecified: Secondary | ICD-10-CM | POA: Diagnosis not present

## 2017-02-19 DIAGNOSIS — R05 Cough: Secondary | ICD-10-CM | POA: Diagnosis not present

## 2017-02-19 DIAGNOSIS — M1990 Unspecified osteoarthritis, unspecified site: Secondary | ICD-10-CM | POA: Diagnosis not present

## 2017-02-19 DIAGNOSIS — J9601 Acute respiratory failure with hypoxia: Secondary | ICD-10-CM | POA: Diagnosis not present

## 2017-02-19 DIAGNOSIS — N183 Chronic kidney disease, stage 3 (moderate): Secondary | ICD-10-CM | POA: Diagnosis not present

## 2017-02-19 DIAGNOSIS — R911 Solitary pulmonary nodule: Secondary | ICD-10-CM | POA: Diagnosis not present

## 2017-02-20 DIAGNOSIS — M1990 Unspecified osteoarthritis, unspecified site: Secondary | ICD-10-CM | POA: Diagnosis not present

## 2017-02-20 DIAGNOSIS — R911 Solitary pulmonary nodule: Secondary | ICD-10-CM | POA: Diagnosis not present

## 2017-02-20 DIAGNOSIS — R05 Cough: Secondary | ICD-10-CM | POA: Diagnosis not present

## 2017-02-20 DIAGNOSIS — N183 Chronic kidney disease, stage 3 (moderate): Secondary | ICD-10-CM | POA: Diagnosis not present

## 2017-02-20 DIAGNOSIS — I129 Hypertensive chronic kidney disease with stage 1 through stage 4 chronic kidney disease, or unspecified chronic kidney disease: Secondary | ICD-10-CM | POA: Diagnosis not present

## 2017-02-20 DIAGNOSIS — J189 Pneumonia, unspecified organism: Secondary | ICD-10-CM | POA: Diagnosis not present

## 2017-02-20 DIAGNOSIS — N179 Acute kidney failure, unspecified: Secondary | ICD-10-CM | POA: Diagnosis not present

## 2017-02-20 DIAGNOSIS — I248 Other forms of acute ischemic heart disease: Secondary | ICD-10-CM | POA: Diagnosis not present

## 2017-02-20 DIAGNOSIS — M109 Gout, unspecified: Secondary | ICD-10-CM | POA: Diagnosis not present

## 2017-02-20 DIAGNOSIS — J9601 Acute respiratory failure with hypoxia: Secondary | ICD-10-CM | POA: Diagnosis not present

## 2017-02-21 DIAGNOSIS — R911 Solitary pulmonary nodule: Secondary | ICD-10-CM | POA: Diagnosis not present

## 2017-02-21 DIAGNOSIS — J189 Pneumonia, unspecified organism: Secondary | ICD-10-CM | POA: Diagnosis not present

## 2017-02-21 DIAGNOSIS — N179 Acute kidney failure, unspecified: Secondary | ICD-10-CM | POA: Diagnosis not present

## 2017-02-21 DIAGNOSIS — R05 Cough: Secondary | ICD-10-CM | POA: Diagnosis not present

## 2017-02-21 DIAGNOSIS — M1990 Unspecified osteoarthritis, unspecified site: Secondary | ICD-10-CM | POA: Diagnosis not present

## 2017-02-21 DIAGNOSIS — I129 Hypertensive chronic kidney disease with stage 1 through stage 4 chronic kidney disease, or unspecified chronic kidney disease: Secondary | ICD-10-CM | POA: Diagnosis not present

## 2017-02-21 DIAGNOSIS — M109 Gout, unspecified: Secondary | ICD-10-CM | POA: Diagnosis not present

## 2017-02-21 DIAGNOSIS — J9601 Acute respiratory failure with hypoxia: Secondary | ICD-10-CM | POA: Diagnosis not present

## 2017-02-21 DIAGNOSIS — N183 Chronic kidney disease, stage 3 (moderate): Secondary | ICD-10-CM | POA: Diagnosis not present

## 2017-02-21 DIAGNOSIS — I248 Other forms of acute ischemic heart disease: Secondary | ICD-10-CM | POA: Diagnosis not present

## 2017-02-22 ENCOUNTER — Encounter: Payer: Self-pay | Admitting: Cardiology

## 2017-02-22 ENCOUNTER — Institutional Professional Consult (permissible substitution): Payer: Medicare Other | Admitting: Cardiology

## 2017-02-22 DIAGNOSIS — I129 Hypertensive chronic kidney disease with stage 1 through stage 4 chronic kidney disease, or unspecified chronic kidney disease: Secondary | ICD-10-CM | POA: Diagnosis not present

## 2017-02-22 DIAGNOSIS — N183 Chronic kidney disease, stage 3 (moderate): Secondary | ICD-10-CM | POA: Diagnosis not present

## 2017-02-22 DIAGNOSIS — R911 Solitary pulmonary nodule: Secondary | ICD-10-CM | POA: Diagnosis not present

## 2017-02-22 DIAGNOSIS — R05 Cough: Secondary | ICD-10-CM | POA: Diagnosis not present

## 2017-02-22 DIAGNOSIS — M1990 Unspecified osteoarthritis, unspecified site: Secondary | ICD-10-CM | POA: Diagnosis not present

## 2017-02-22 DIAGNOSIS — J9601 Acute respiratory failure with hypoxia: Secondary | ICD-10-CM | POA: Diagnosis not present

## 2017-02-22 DIAGNOSIS — N179 Acute kidney failure, unspecified: Secondary | ICD-10-CM | POA: Diagnosis not present

## 2017-02-22 DIAGNOSIS — M109 Gout, unspecified: Secondary | ICD-10-CM | POA: Diagnosis not present

## 2017-02-22 DIAGNOSIS — J189 Pneumonia, unspecified organism: Secondary | ICD-10-CM | POA: Diagnosis not present

## 2017-02-22 DIAGNOSIS — I248 Other forms of acute ischemic heart disease: Secondary | ICD-10-CM | POA: Diagnosis not present

## 2017-02-22 NOTE — Progress Notes (Deleted)
Electrophysiology Office Note   Date:  02/22/2017   ID:  Varun, Jourdan [birth year], MRN [id number]  PCP:  Allean Found, MD  Cardiologist:  Revankar Primary Electrophysiologist:  Constance Haw, MD    No chief complaint on file.    History of Present Illness: Chase Evans is a 77 y.o. male who is being seen today for the evaluation of syncope at the request of Sunny Schlein Revankar. Presenting today for electrophysiology evaluation.  He has a past history of hypertension and renal insufficiency.  Hematuria in the hospital after experiencing an episode of syncope.  He had no chest pain, orthopnea, or PND.  He does lead a sedentary lifestyle.  He had never had a prior syncopal event.  He does not remember events around the episode.  He woke up in the ambulance.  Today, he denies*** symptoms of palpitations, chest pain, shortness of breath, orthopnea, PND, lower extremity edema, claudication, dizziness, presyncope, syncope, bleeding, or neurologic sequela. The patient is tolerating medications without difficulties.    Past Medical History:  Diagnosis Date  . Arthritis   . BPH (benign prostatic hyperplasia)   . Family history of anesthesia complication    mother N/V  . Hyperlipemia   . Hypertension   . TIA (transient ischemic attack)    Past Surgical History:  Procedure Laterality Date  . BACK SURGERY  05/12/08  . CERVICAL DISC SURGERY       Current Outpatient Medications  Medication Sig Dispense Refill  . acetaminophen (TYLENOL) 650 MG CR tablet Take by mouth.    Marland Kitchen allopurinol (ZYLOPRIM) 300 MG tablet Take 300 mg by mouth daily.     Marland Kitchen amLODipine (NORVASC) 5 MG tablet Take 2 tablets (10 mg total) by mouth daily. 90 tablet 1  . aspirin 81 MG tablet Take 81 mg by mouth daily.    Marland Kitchen atorvastatin (LIPITOR) 10 MG tablet Take 1 tablet (10 mg total) by mouth daily at 6 PM. 90 tablet 0  . clopidogrel (PLAVIX) 75 MG tablet Take 75 mg by mouth daily.    . fluconazole  (DIFLUCAN) 150 MG tablet Take 150 mg by mouth daily.    . furosemide (LASIX) 20 MG tablet Take 20 mg by mouth 2 (two) times daily.     Marland Kitchen gabapentin (NEURONTIN) 600 MG tablet Take 600 mg by mouth 3 (three) times daily.     . hydrALAZINE (APRESOLINE) 25 MG tablet Take 1 tablet (25 mg total) by mouth 3 (three) times daily. 270 tablet 1  . Omega-3 Fatty Acids (FISH OIL) 1000 MG CAPS Take by mouth 2 (two) times daily.    Marland Kitchen terbinafine (LAMISIL) 250 MG tablet Take 250 mg by mouth every other day.     No current facility-administered medications for this visit.     Allergies:   Nsaids; Penicillins; Percocet [oxycodone-acetaminophen]; and Crestor [rosuvastatin]   Social History:  The patient  reports that he quit smoking about 44 years ago. He quit after 4.00 years of use. he has never used smokeless tobacco. He reports that he does not drink alcohol or use drugs.   Family History:  The patient's family history includes Diabetes in his mother; Heart attack in his mother; Heart disease in his mother.    ROS:  Please see the history of present illness.   Otherwise, review of systems is positive for ***.   All other systems are reviewed and negative.    PHYSICAL EXAM: VS:  There were no vitals taken for  this visit. , BMI There is no height or weight on file to calculate BMI. GEN: Well nourished, well developed, in no acute distress  HEENT: normal  Neck: no JVD, carotid bruits, or masses Cardiac: ***RRR; no murmurs, rubs, or gallops,no edema  Respiratory:  clear to auscultation bilaterally, normal work of breathing GI: soft, nontender, nondistended, + BS MS: no deformity or atrophy  Skin: warm and dry Neuro:  Strength and sensation are intact Psych: euthymic mood, full affect  EKG:  EKG {ACTION; IS/IS [id number]} ordered today. Personal review of the ekg ordered shows ***  Recent Labs: 01/18/2017: ALT 12; TSH 4.970    Lipid Panel     Component Value Date/Time   CHOL 223 (H) 01/18/2017  1026   TRIG 118 01/18/2017 1026   HDL 49 01/18/2017 1026   CHOLHDL 4.6 01/18/2017 1026   LDLCALC 150 (H) 01/18/2017 1026     Wt Readings from Last 3 Encounters:  01/25/17 188 lb (85.3 kg)  01/18/17 188 lb 1.9 oz (85.3 kg)  10/22/11 216 lb 14.9 oz (98.4 kg)      Other studies Reviewed: Additional studies/ records that were reviewed today include: TTE 01/07/17  Review of the above records today demonstrates:  LV systolic chamber size and function, EF 55%.  Impaired diastolic relaxation. Mild left atrial enlargement. Normal right heart structures and pressure. Mild mitral regurgitation.  01/25/17 Spect  Nuclear stress EF: 70%. The left ventricular ejection fraction is hyperdynamic (>65%).  The study is normal. There is no evidence of ischemia. There is no evidence of previous infarction.  This is a low risk study.  Holter 01/31/17 - personally reviewed Baseline rhythm: Sinus  Minimum heart rate: 54 BPM.  Average heart rate: 95 BPM.  Maximal heart rate 152 BPM.  Atrial arrhythmia: Patient had episodes of narrow QRS tachycardia suggesting SVT and the fastest was at 152/min.  The longest run was 23 beats.  Ventricular arrhythmia: None significant   Conduction abnormality: None significant  Symptoms: None significant  ASSESSMENT AND PLAN:  1.  Syncope: ***  2. Hyperlipidemia: ***  3. Hypertension with CKD III: ***   Current medicines are reviewed at length with the patient today.   The patient {ACTIONS; HAS/DOES NOT HAVE:19233} concerns regarding his medicines.  The following changes were made today:  {NONE DEFAULTED:18576::"none"}  Labs/ tests ordered today include: *** No orders of the defined types were placed in this encounter.    Disposition:   FU with Solomiya Pascale {gen number [id number]} {Days to years:10300}  Signed, Jacquese Hackman Meredith Leeds, MD  02/22/2017 8:37 AM     [facility] [address] [place]  [address] [phone] (office) [phone] (fax)

## 2017-02-23 DIAGNOSIS — J189 Pneumonia, unspecified organism: Secondary | ICD-10-CM | POA: Diagnosis not present

## 2017-02-23 DIAGNOSIS — I248 Other forms of acute ischemic heart disease: Secondary | ICD-10-CM | POA: Diagnosis not present

## 2017-02-23 DIAGNOSIS — N183 Chronic kidney disease, stage 3 (moderate): Secondary | ICD-10-CM | POA: Diagnosis not present

## 2017-02-23 DIAGNOSIS — R05 Cough: Secondary | ICD-10-CM | POA: Diagnosis not present

## 2017-02-23 DIAGNOSIS — J9601 Acute respiratory failure with hypoxia: Secondary | ICD-10-CM | POA: Diagnosis not present

## 2017-02-23 DIAGNOSIS — N179 Acute kidney failure, unspecified: Secondary | ICD-10-CM | POA: Diagnosis not present

## 2017-02-23 DIAGNOSIS — M1990 Unspecified osteoarthritis, unspecified site: Secondary | ICD-10-CM | POA: Diagnosis not present

## 2017-02-23 DIAGNOSIS — R911 Solitary pulmonary nodule: Secondary | ICD-10-CM | POA: Diagnosis not present

## 2017-02-23 DIAGNOSIS — M109 Gout, unspecified: Secondary | ICD-10-CM | POA: Diagnosis not present

## 2017-02-23 DIAGNOSIS — I129 Hypertensive chronic kidney disease with stage 1 through stage 4 chronic kidney disease, or unspecified chronic kidney disease: Secondary | ICD-10-CM | POA: Diagnosis not present

## 2017-08-21 ENCOUNTER — Other Ambulatory Visit: Payer: Self-pay | Admitting: Cardiology

## 2018-03-05 IMAGING — CT CT L SPINE W/ CM
1 of 8 series · 5 of 14 positions shown, 7 images · non-contrast
Comparison: CT of the abdomen and pelvis 11/12/2015

CLINICAL DATA: Low back pain. Weakness in lower extremities
bilaterally. Legs give way. Pain is improved with bending forward.
TECHNIQUE: Contiguous axial images were obtained through the Lumbar spine after
the intrathecal infusion of infusion. Coronal and sagittal
reconstructions were obtained of the axial image sets.

[Series 3: l spine soft · axial · 0.32mm/px · z∈[-284,-134]mm · 5 of 76 slices shown, 7 images]
[im 13/76  soft-tissue]
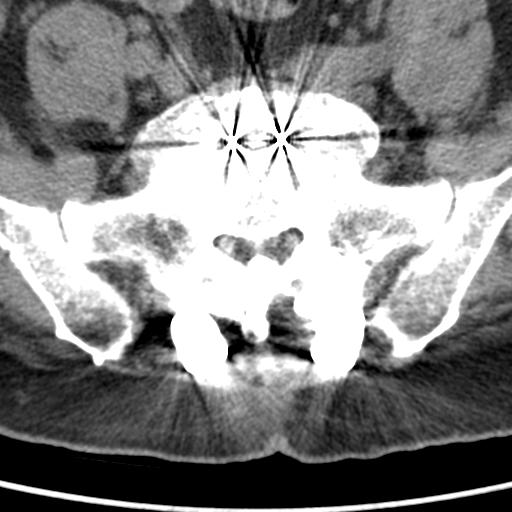
[im 13/76  bone]
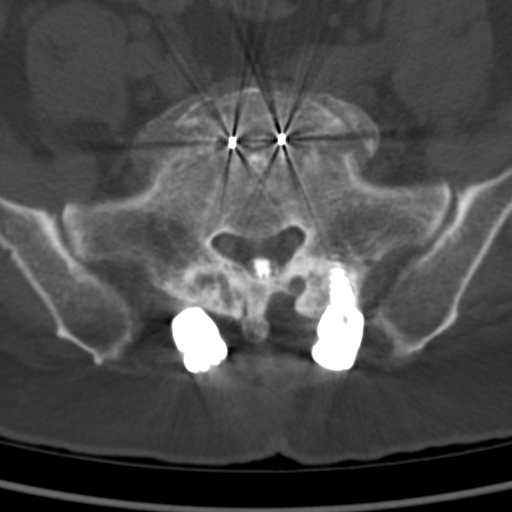
[im 26/76  bone]
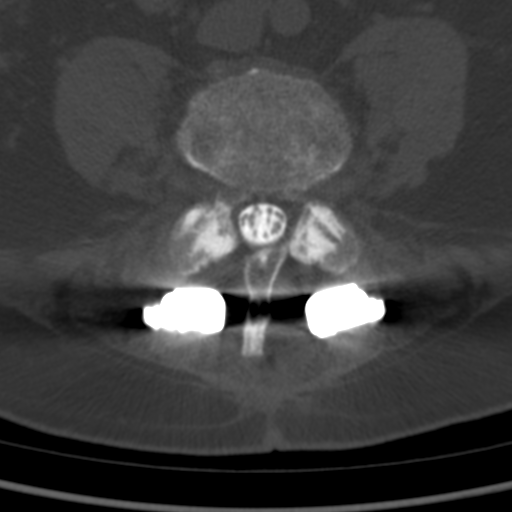
[im 38/76  bone]
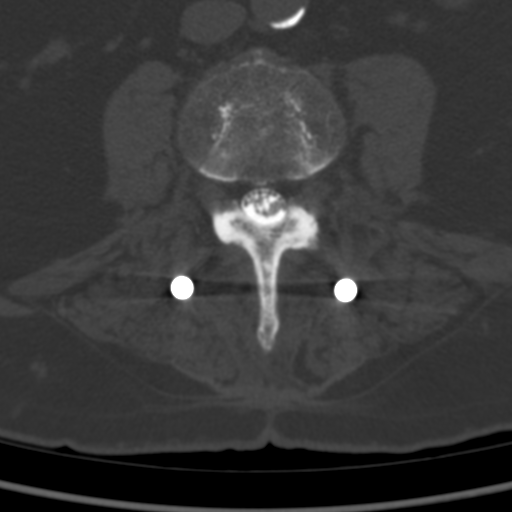
[im 51/76  bone]
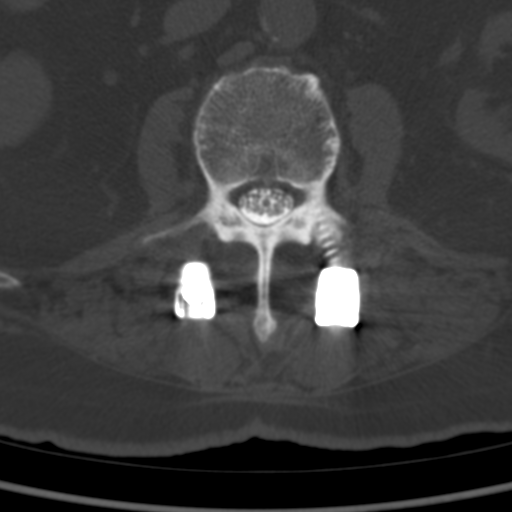
[im 63/76  soft-tissue]
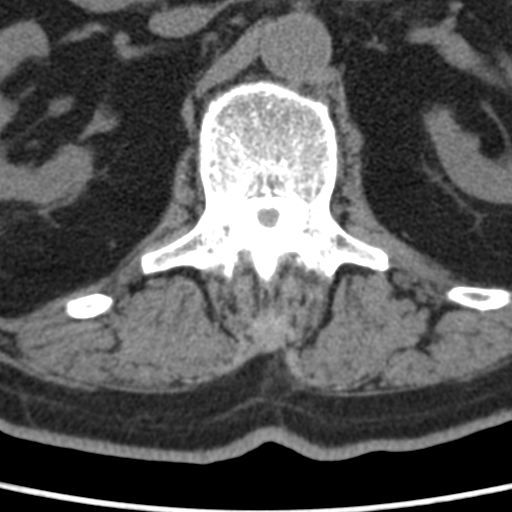
[im 63/76  bone]
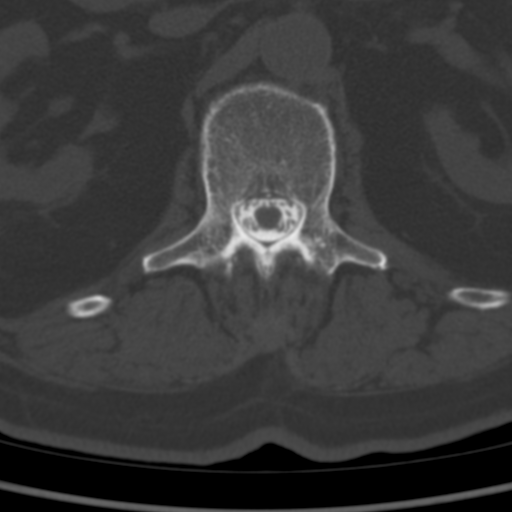

[5 of 14 positions shown; findings below may reference images not displayed]

EXAM:
LUMBAR MYELOGRAM

FLUOROSCOPY TIME:  Radiation Exposure Index (as provided by the
fluoroscopic device): 334.47 uGy*m2

If the device does not provide the exposure index:

Fluoroscopy Time:  32 seconds

Number of Acquired Images:  17

PROCEDURE:
After thorough discussion of risks and benefits of the procedure
including bleeding, infection, injury to nerves, blood vessels,
adjacent structures as well as headache and CSF leak, written and
oral informed consent was obtained. Consent was obtained by Dr.
Chicano Winslow. Time out form was completed.

Patient was positioned prone on the fluoroscopy table. Local
anesthesia was provided with 1% lidocaine without epinephrine after
prepped and draped in the usual sterile fashion. Puncture was
performed at L2-3 using a 3 1/2 inch 22-gauge spinal needle via a
left paramedian approach. Using a single pass through the dura, the
needle was placed within the thecal sac, with return of clear CSF.
15 mL of Isovue M 200 was injected into the thecal sac, with normal
opacification of the nerve roots and cauda equina consistent with
free flow within the subarachnoid space.

I personally performed the lumbar puncture and administered the
intrathecal contrast. I also personally supervised acquisition of
the myelogram images.
FINDINGS: LUMBAR MYELOGRAM FINDINGS:

Solid lumbar fusion is present at L2-3, L3-4, L4-5, and L5-S1. There
is no focal stenosis. There is no abnormal motion with flexion or
extension.

With adjacent level disease is present at L1-2 with focal central
canal stenosis at L1-2. This is exaggerated with standing. The
stenosis is partially relieved in flexion. Go no other focal
stenosis is present. The hardware is intact.

CT LUMBAR MYELOGRAM FINDINGS:

The lumbar spine is imaged from the midbody of T12 through S2-3. The
conus medullaris terminates at L2, within normal limits. Solid
fusion is noted across the disc space at L2-3, L3-4, L4-5, and
L5-S1. Posterior fusion is present at the same levels. Bilateral
pedicle screw and rod fixation across these levels is intact. There
are connectors from L2-4 rods and L4-S1 rods bilaterally.

Limited imaging of the abdomen demonstrates atherosclerotic changes
in the aorta without aneurysm. A benign-appearing cyst in the right
kidney is incompletely imaged.

T12-L1: Mild facet hypertrophy is present bilaterally. There is no
focal disc protrusion or stenosis.

L1-2: A broad-based disc protrusion is present. Moderate facet
hypertrophy and ligamentum flavum thickening is noted. This is worse
on the right. Moderate central and bilateral foraminal stenosis is
present.

L1-2: Bilateral laminectomies are present. The central canal and
foramina are decompressed.

L3-4: Bilateral laminectomies are present. The central canal and
bilateral foramina are decompressed.

L4-5: Bilateral laminectomies are present. The central canal and
foramina are decompressed.

L5-S1: Bilateral laminectomies are present. The central canal is
decompressed. Mild osseous foraminal narrowing is present
bilaterally, left greater than right.
IMPRESSION: 1. Solid anterior and posterior lumbar fusion at L2-3, L3-4, and
L4-5 without significant residual or recurrent stenosis at these
levels.
2. Mild osseous foraminal narrowing at L5-S1 with solid anterior and
posterior fusion.
3. Gaze level disease at L1-2 with a broad-based disc protrusion
leading to moderate central and bilateral foraminal stenosis. This
stenosis is exaggerated with standing. There is partial
decompression of the stenosis with flexion.
4. Surgical fusion hardware is intact.

## 2018-03-05 IMAGING — XA DG MYELOGRAPHY LUMBAR INJ LUMBOSACRAL
13 of 19 series · 13 of 19 positions shown · non-contrast
Comparison: CT of the abdomen and pelvis 11/12/2015

CLINICAL DATA: Low back pain. Weakness in lower extremities
bilaterally. Legs give way. Pain is improved with bending forward.
TECHNIQUE: Contiguous axial images were obtained through the Lumbar spine after
the intrathecal infusion of infusion. Coronal and sagittal
reconstructions were obtained of the axial image sets.

[Series 1: w lumbar spine lat · 0.15mm/px · 1 of 1 slices shown]
[im 1/1]
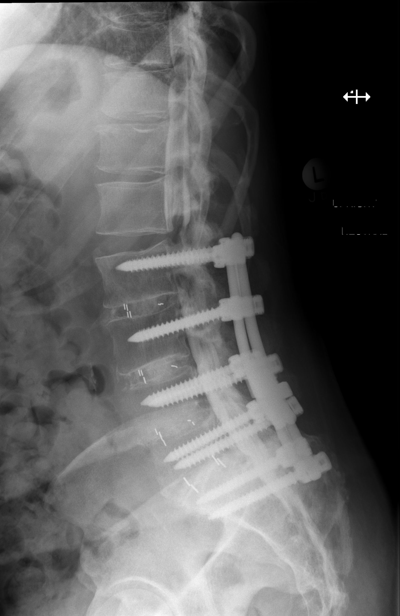

[Series 2: w lumbar spine flexion · 0.15mm/px · 1 of 1 slices shown]
[im 1/1]
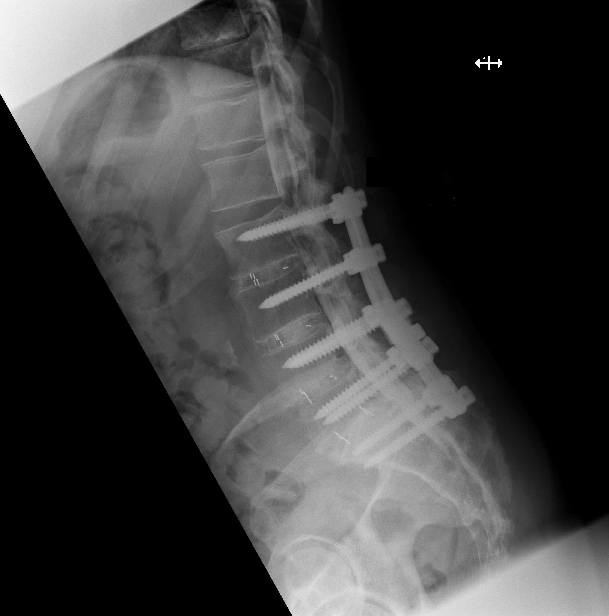

[Series 2: vasc standard · 1 of 1 slices shown (1 of 11)]
[im 1/1]
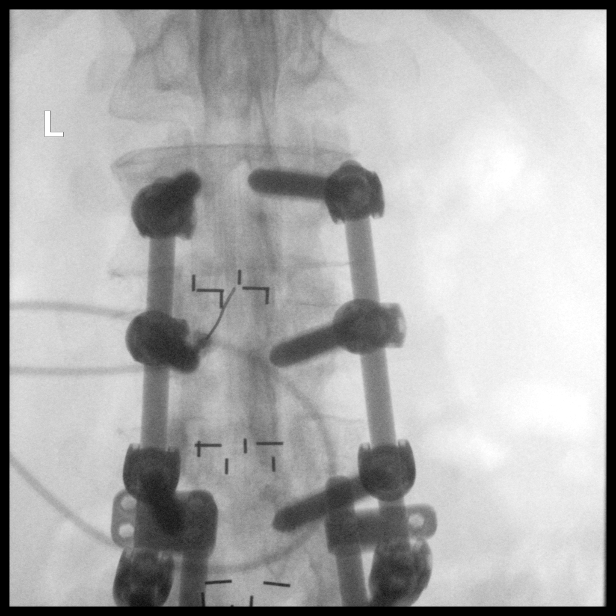

[Series 3: vasc standard · 1 of 1 slices shown (2 of 11)]
[im 1/1]
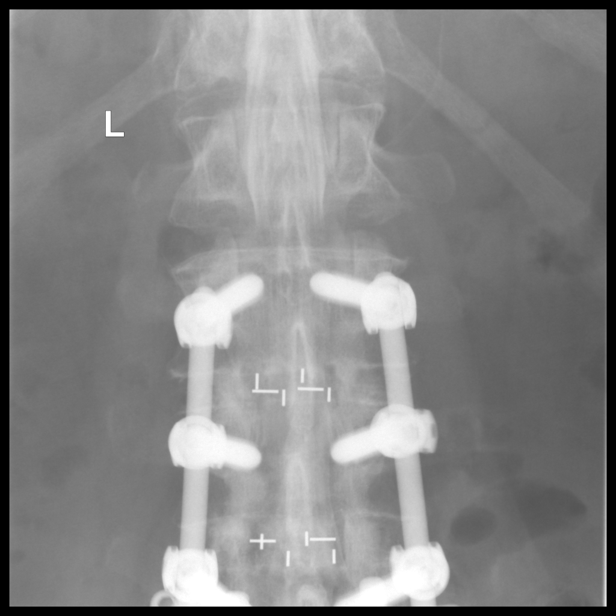

[Series 4: vasc standard · 1 of 1 slices shown (3 of 11)]
[im 1/1]
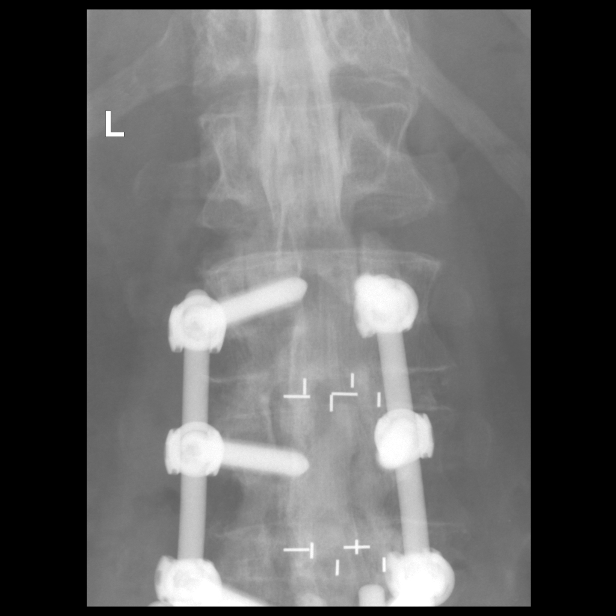

[Series 6: vasc standard · 1 of 1 slices shown (4 of 11)]
[im 1/1]
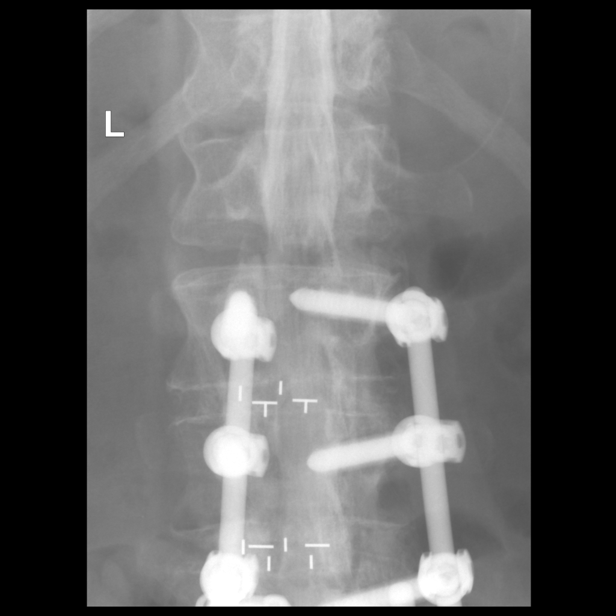

[Series 7: vasc standard · 1 of 1 slices shown (5 of 11)]
[im 1/1]
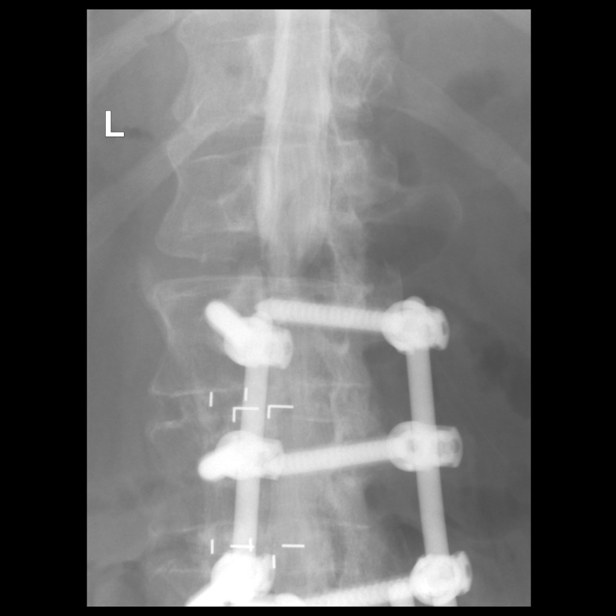

[Series 8: vasc standard · 1 of 1 slices shown (6 of 11)]
[im 1/1]
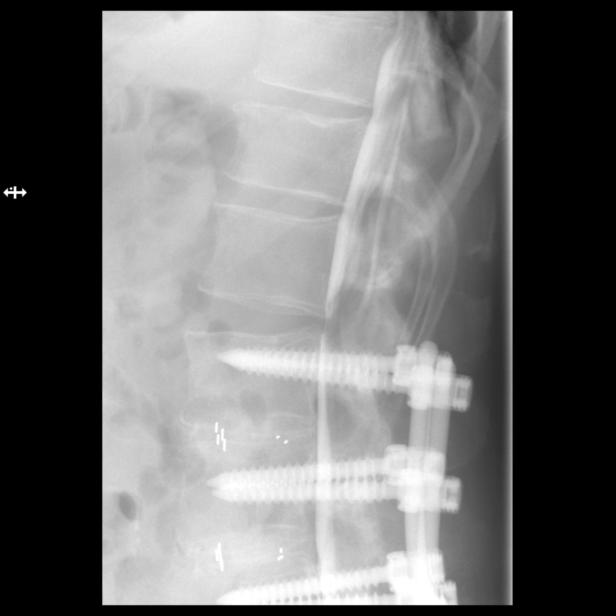

[Series 10: vasc standard · 1 of 1 slices shown (7 of 11)]
[im 1/1]
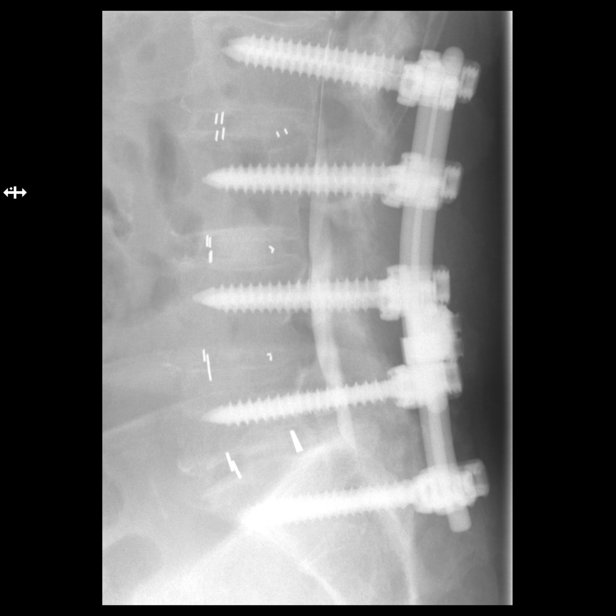

[Series 11: vasc standard · 1 of 1 slices shown (8 of 11)]
[im 1/1]
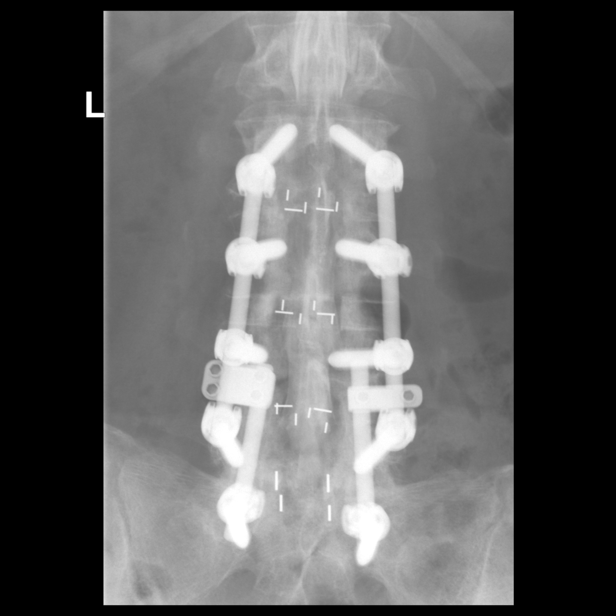

[Series 13: vasc standard · 1 of 1 slices shown (9 of 11)]
[im 1/1]
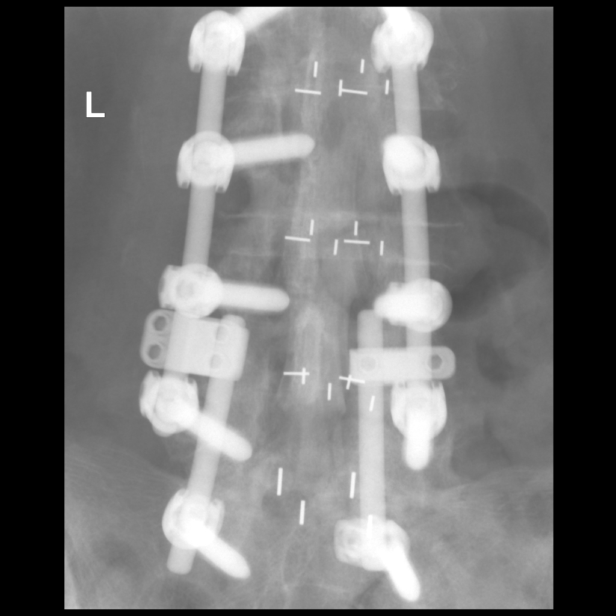

[Series 14: vasc standard · 1 of 1 slices shown (10 of 11)]
[im 1/1]
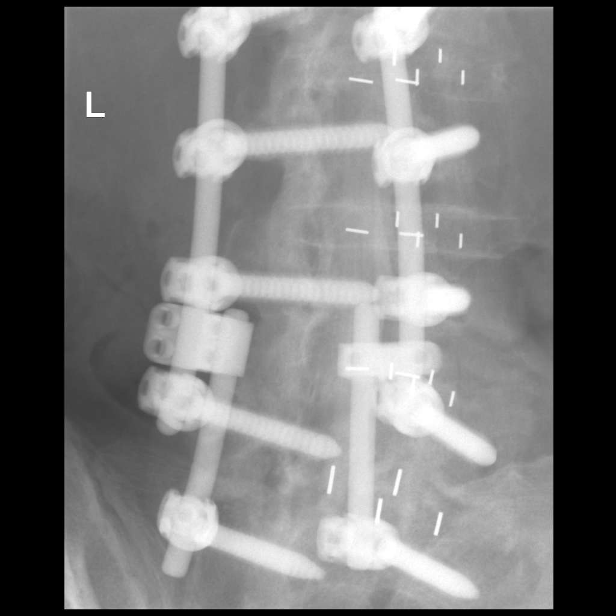

[Series 16: vasc standard · 1 of 1 slices shown (11 of 11)]
[im 1/1]
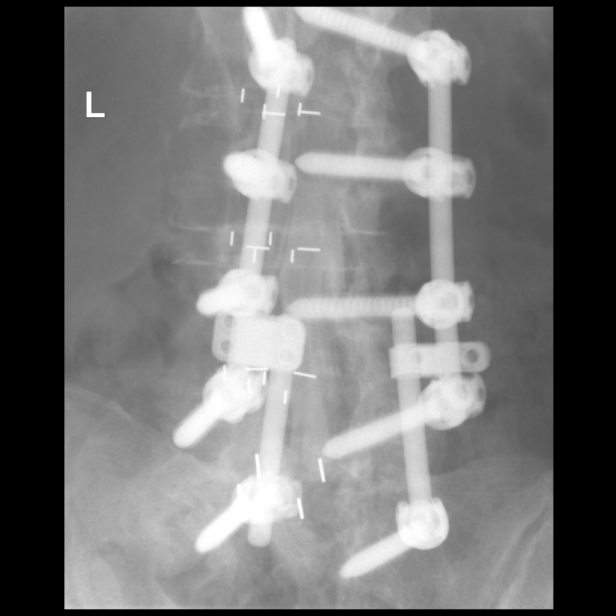

[13 of 19 positions shown; findings below may reference images not displayed]

EXAM:
LUMBAR MYELOGRAM

FLUOROSCOPY TIME:  Radiation Exposure Index (as provided by the
fluoroscopic device): 334.47 uGy*m2

If the device does not provide the exposure index:

Fluoroscopy Time:  32 seconds

Number of Acquired Images:  17

PROCEDURE:
After thorough discussion of risks and benefits of the procedure
including bleeding, infection, injury to nerves, blood vessels,
adjacent structures as well as headache and CSF leak, written and
oral informed consent was obtained. Consent was obtained by Dr.
Chicano Winslow. Time out form was completed.

Patient was positioned prone on the fluoroscopy table. Local
anesthesia was provided with 1% lidocaine without epinephrine after
prepped and draped in the usual sterile fashion. Puncture was
performed at L2-3 using a 3 1/2 inch 22-gauge spinal needle via a
left paramedian approach. Using a single pass through the dura, the
needle was placed within the thecal sac, with return of clear CSF.
15 mL of Isovue M 200 was injected into the thecal sac, with normal
opacification of the nerve roots and cauda equina consistent with
free flow within the subarachnoid space.

I personally performed the lumbar puncture and administered the
intrathecal contrast. I also personally supervised acquisition of
the myelogram images.
FINDINGS: LUMBAR MYELOGRAM FINDINGS:

Solid lumbar fusion is present at L2-3, L3-4, L4-5, and L5-S1. There
is no focal stenosis. There is no abnormal motion with flexion or
extension.

With adjacent level disease is present at L1-2 with focal central
canal stenosis at L1-2. This is exaggerated with standing. The
stenosis is partially relieved in flexion. Go no other focal
stenosis is present. The hardware is intact.

CT LUMBAR MYELOGRAM FINDINGS:

The lumbar spine is imaged from the midbody of T12 through S2-3. The
conus medullaris terminates at L2, within normal limits. Solid
fusion is noted across the disc space at L2-3, L3-4, L4-5, and
L5-S1. Posterior fusion is present at the same levels. Bilateral
pedicle screw and rod fixation across these levels is intact. There
are connectors from L2-4 rods and L4-S1 rods bilaterally.

Limited imaging of the abdomen demonstrates atherosclerotic changes
in the aorta without aneurysm. A benign-appearing cyst in the right
kidney is incompletely imaged.

T12-L1: Mild facet hypertrophy is present bilaterally. There is no
focal disc protrusion or stenosis.

L1-2: A broad-based disc protrusion is present. Moderate facet
hypertrophy and ligamentum flavum thickening is noted. This is worse
on the right. Moderate central and bilateral foraminal stenosis is
present.

L1-2: Bilateral laminectomies are present. The central canal and
foramina are decompressed.

L3-4: Bilateral laminectomies are present. The central canal and
bilateral foramina are decompressed.

L4-5: Bilateral laminectomies are present. The central canal and
foramina are decompressed.

L5-S1: Bilateral laminectomies are present. The central canal is
decompressed. Mild osseous foraminal narrowing is present
bilaterally, left greater than right.
IMPRESSION: 1. Solid anterior and posterior lumbar fusion at L2-3, L3-4, and
L4-5 without significant residual or recurrent stenosis at these
levels.
2. Mild osseous foraminal narrowing at L5-S1 with solid anterior and
posterior fusion.
3. Gaze level disease at L1-2 with a broad-based disc protrusion
leading to moderate central and bilateral foraminal stenosis. This
stenosis is exaggerated with standing. There is partial
decompression of the stenosis with flexion.
4. Surgical fusion hardware is intact.

## 2018-12-28 ENCOUNTER — Telehealth: Payer: Self-pay | Admitting: Emergency Medicine

## 2018-12-28 ENCOUNTER — Telehealth: Payer: Medicare Other | Admitting: Cardiology

## 2018-12-28 ENCOUNTER — Other Ambulatory Visit: Payer: Self-pay

## 2018-12-28 ENCOUNTER — Encounter: Payer: Self-pay | Admitting: Cardiology

## 2018-12-28 NOTE — Telephone Encounter (Signed)
Called patient to start "rooming" process for patient's virtual appointment today with Dr. Geraldo Pitter. The patient informed me this was a follow up with Dr. Geraldo Pitter. We went through medications and history. After completing the "rooming" part I went on to ask how we would be completing virtual visit, video, or telephone. He then told me he was confused when he got a call for a appointment yesterday, he told them to have them just call him and he didn't realize that he scheduled himself for a virtual appointment. He hasn't seen Dr. Geraldo Pitter in almost 2 years and reports he doesn't need to see him at this time. I offered him another appointment, he denied. Patient asked me to cancel his appointment today. No further questions.

## 2021-06-08 ENCOUNTER — Other Ambulatory Visit: Payer: Self-pay | Admitting: Neurosurgery

## 2021-06-15 ENCOUNTER — Other Ambulatory Visit: Payer: Self-pay | Admitting: Neurosurgery

## 2021-06-21 NOTE — Progress Notes (Signed)
Surgical Instructions    Your procedure is scheduled on Thursday June 15.  Report to [facility] Main Entrance "A" at 10:37 A.M., then check in with the Admitting office.  Call this number if you have problems the morning of surgery:  [phone]   If you have any questions prior to your surgery date call [phone]: Open Monday-Friday 8am-4pm    Remember:  Do not eat or drink anything after midnight the night before your surgery    Take these medicines the morning of surgery with A SIP OF WATER:  allopurinol (ZYLOPRIM)  amLODipine (NORVASC)  gabapentin (NEURONTIN) tamsulosin (FLOMAX)   As of today, STOP taking any Aspirin (unless otherwise instructed by your surgeon) Aleve, Naproxen, Ibuprofen, Motrin, Advil, Goody's, BC's, all herbal medications, fish oil, and all vitamins.  Please follow your surgeons instructions on when to stop taking clopidogrel (PLAVIX).        Do not wear jewelry or makeup Do not wear lotions, powders, perfumes/colognes, or deodorant. Men may shave face and neck. Do not bring valuables to the hospital. Do not wear nail polish, gel polish, artificial nails, or any other type of covering on natural nails (fingers and toes) If you have artificial nails or gel coating that need to be removed by a nail salon, please have this removed prior to surgery. Artificial nails or gel coating may interfere with anesthesia's ability to adequately monitor your vital signs.  [facility] is not responsible for any belongings or valuables. .   Do NOT Smoke (Tobacco/Vaping)  24 hours prior to your procedure  If you use a CPAP at night, you may bring your mask for your overnight stay.   Contacts, glasses, hearing aids, dentures or partials may not be worn into surgery, please bring cases for these belongings   For patients admitted to the hospital, discharge time will be determined by your treatment team.   Patients discharged the day of surgery will not be allowed to  drive home, and someone needs to stay with them for 24 hours.   SURGICAL WAITING ROOM VISITATION Patients having surgery or a procedure in a hospital may have two support people. Children under the age of 44 must have an adult with them who is not the patient. They may stay in the waiting area during the procedure and may switch out with other visitors. If the patient needs to stay at the hospital during part of their recovery, the visitor guidelines for inpatient rooms apply.  Please refer to the [facility] website for the visitor guidelines for Inpatients (after your surgery is over and you are in a regular room).       Special instructions:    Oral Hygiene is also important to reduce your risk of infection.  Remember - BRUSH YOUR TEETH THE MORNING OF SURGERY WITH YOUR REGULAR TOOTHPASTE   [facility]- Preparing For Surgery  Before surgery, you can play an important role. Because skin is not sterile, your skin needs to be as free of germs as possible. You can reduce the number of germs on your skin by washing with CHG (chlorahexidine gluconate) Soap before surgery.  CHG is an antiseptic cleaner which kills germs and bonds with the skin to continue killing germs even after washing.     Please do not use if you have an allergy to CHG or antibacterial soaps. If your skin becomes reddened/irritated stop using the CHG.  Do not shave (including legs and underarms) for at least 48 hours prior to  first CHG shower. It is OK to shave your face.  Please follow these instructions carefully.     Shower the NIGHT BEFORE SURGERY and the MORNING OF SURGERY with CHG Soap.   If you chose to wash your hair, wash your hair first as usual with your normal shampoo. After you shampoo, rinse your hair and body thoroughly to remove the shampoo.  Then [employer] and genitals (private parts) with your normal soap and rinse thoroughly to remove soap.  After that Use CHG Soap as you would any other liquid soap.  You can apply CHG directly to the skin and wash gently with a scrungie or a clean washcloth.   Apply the CHG Soap to your body ONLY FROM THE NECK DOWN.  Do not use on open wounds or open sores. Avoid contact with your eyes, ears, mouth and genitals (private parts). Wash Face and genitals (private parts)  with your normal soap.   Wash thoroughly, paying special attention to the area where your surgery will be performed.  Thoroughly rinse your body with warm water from the neck down.  DO NOT shower/wash with your normal soap after using and rinsing off the CHG Soap.  Pat yourself dry with a CLEAN TOWEL.  Wear CLEAN PAJAMAS to bed the night before surgery  Place CLEAN SHEETS on your bed the night before your surgery  DO NOT SLEEP WITH PETS.   Day of Surgery: Take a shower with CHG soap. Wear Clean/Comfortable clothing the morning of surgery Do not apply any deodorants/lotions.   Remember to brush your teeth WITH YOUR REGULAR TOOTHPASTE.    If you received a COVID test during your pre-op visit, it is requested that you wear a mask when out in public, stay away from anyone that may not be feeling well, and notify your surgeon if you develop symptoms. If you have been in contact with anyone that has tested positive in the last 10 days, please notify your surgeon.    Please read over the following fact sheets that you were given.

## 2021-06-21 NOTE — Progress Notes (Signed)
Surgical Instructions    Your procedure is scheduled on Thursday June 15.  Report to [facility] Main Entrance "A" at 10:37 A.M., then check in with the Admitting office.  Call this number if you have problems the morning of surgery:  [phone]   If you have any questions prior to your surgery date call [phone]: Open Monday-Friday 8am-4pm    Remember:  Do not eat or drink anything after midnight the night before your surgery    Take these medicines the morning of surgery with A SIP OF WATER:  allopurinol (ZYLOPRIM)  amLODipine (NORVASC)  gabapentin (NEURONTIN) tamsulosin (FLOMAX)   As of today, STOP taking any Aspirin (unless otherwise instructed by your surgeon) Aleve, Naproxen, Ibuprofen, Motrin, Advil, Goody's, BC's, all herbal medications, fish oil, and all vitamins.           Do not wear jewelry or makeup Do not wear lotions, powders, perfumes/colognes, or deodorant. Do not shave 48 hours prior to surgery.  Men may shave face and neck. Do not bring valuables to the hospital. Do not wear nail polish, gel polish, artificial nails, or any other type of covering on natural nails (fingers and toes) If you have artificial nails or gel coating that need to be removed by a nail salon, please have this removed prior to surgery. Artificial nails or gel coating may interfere with anesthesia's ability to adequately monitor your vital signs.  [facility] is not responsible for any belongings or valuables. .   Do NOT Smoke (Tobacco/Vaping)  24 hours prior to your procedure  If you use a CPAP at night, you may bring your mask for your overnight stay.   Contacts, glasses, hearing aids, dentures or partials may not be worn into surgery, please bring cases for these belongings   For patients admitted to the hospital, discharge time will be determined by your treatment team.   Patients discharged the day of surgery will not be allowed to drive home, and someone needs to stay with  them for 24 hours.   SURGICAL WAITING ROOM VISITATION Patients having surgery or a procedure in a hospital may have two support people. Children under the age of 70 must have an adult with them who is not the patient. They may stay in the waiting area during the procedure and may switch out with other visitors. If the patient needs to stay at the hospital during part of their recovery, the visitor guidelines for inpatient rooms apply.  Please refer to the [facility] website for the visitor guidelines for Inpatients (after your surgery is over and you are in a regular room).       Special instructions:    Oral Hygiene is also important to reduce your risk of infection.  Remember - BRUSH YOUR TEETH THE MORNING OF SURGERY WITH YOUR REGULAR TOOTHPASTE   [place]- Preparing For Surgery  Before surgery, you can play an important role. Because skin is not sterile, your skin needs to be as free of germs as possible. You can reduce the number of germs on your skin by washing with CHG (chlorahexidine gluconate) Soap before surgery.  CHG is an antiseptic cleaner which kills germs and bonds with the skin to continue killing germs even after washing.     Please do not use if you have an allergy to CHG or antibacterial soaps. If your skin becomes reddened/irritated stop using the CHG.  Do not shave (including legs and underarms) for at least 48 hours prior to first  CHG shower. It is OK to shave your face.  Please follow these instructions carefully.     Shower the NIGHT BEFORE SURGERY and the MORNING OF SURGERY with CHG Soap.   If you chose to wash your hair, wash your hair first as usual with your normal shampoo. After you shampoo, rinse your hair and body thoroughly to remove the shampoo.  Then [employer] and genitals (private parts) with your normal soap and rinse thoroughly to remove soap.  After that Use CHG Soap as you would any other liquid soap. You can apply CHG directly to the skin and  wash gently with a scrungie or a clean washcloth.   Apply the CHG Soap to your body ONLY FROM THE NECK DOWN.  Do not use on open wounds or open sores. Avoid contact with your eyes, ears, mouth and genitals (private parts). Wash Face and genitals (private parts)  with your normal soap.   Wash thoroughly, paying special attention to the area where your surgery will be performed.  Thoroughly rinse your body with warm water from the neck down.  DO NOT shower/wash with your normal soap after using and rinsing off the CHG Soap.  Pat yourself dry with a CLEAN TOWEL.  Wear CLEAN PAJAMAS to bed the night before surgery  Place CLEAN SHEETS on your bed the night before your surgery  DO NOT SLEEP WITH PETS.   Day of Surgery:  Take a shower with CHG soap. Wear Clean/Comfortable clothing the morning of surgery Do not apply any deodorants/lotions.   Remember to brush your teeth WITH YOUR REGULAR TOOTHPASTE.    If you received a COVID test during your pre-op visit, it is requested that you wear a mask when out in public, stay away from anyone that may not be feeling well, and notify your surgeon if you develop symptoms. If you have been in contact with anyone that has tested positive in the last 10 days, please notify your surgeon.    Please read over the following fact sheets that you were given.

## 2021-06-22 ENCOUNTER — Other Ambulatory Visit: Payer: Self-pay

## 2021-06-22 ENCOUNTER — Encounter (HOSPITAL_COMMUNITY): Payer: Self-pay

## 2021-06-22 ENCOUNTER — Encounter (HOSPITAL_COMMUNITY)
Admission: RE | Admit: 2021-06-22 | Discharge: 2021-06-22 | Disposition: A | Discharge status: Home / Self Care | Payer: Medicare Other | Source: Ambulatory Visit | Attending: Neurosurgery | Admitting: Neurosurgery

## 2021-06-22 VITALS — BP 152/75 | HR 69 | Temp 97.4°F | Resp 17 | Ht 67.0 in | Wt 166.9 lb

## 2021-06-22 DIAGNOSIS — Z01818 Encounter for other preprocedural examination: Secondary | ICD-10-CM | POA: Diagnosis not present

## 2021-06-22 HISTORY — DX: Sleep apnea, unspecified: G47.30

## 2021-06-22 HISTORY — DX: Chronic kidney disease, unspecified: N18.9

## 2021-06-22 LAB — SURGICAL PCR SCREEN
MRSA, PCR: NEGATIVE
Staphylococcus aureus: NEGATIVE

## 2021-06-22 LAB — BASIC METABOLIC PANEL
Anion gap: 11 (ref 5–15)
BUN: 45 mg/dL — ABNORMAL HIGH (ref 8–23)
CO2: 23 mmol/L (ref 22–32)
Calcium: 8.3 mg/dL — ABNORMAL LOW (ref 8.9–10.3)
Chloride: 109 mmol/L (ref 98–111)
Creatinine, Ser: 5.45 mg/dL — ABNORMAL HIGH (ref 0.61–1.24)
GFR, Estimated: 10 mL/min — ABNORMAL LOW (ref >60)
Glucose, Bld: 111 mg/dL — ABNORMAL HIGH (ref 70–99)
Potassium: 3.6 mmol/L (ref 3.5–5.1)
Sodium: 143 mmol/L (ref 135–145)

## 2021-06-22 LAB — CBC
HCT: 33.1 % — ABNORMAL LOW (ref 39.0–52.0)
Hemoglobin: 10.5 g/dL — ABNORMAL LOW (ref 13.0–17.0)
MCH: 29.7 pg (ref 26.0–34.0)
MCHC: 31.7 g/dL (ref 30.0–36.0)
MCV: 93.8 fL (ref 80.0–100.0)
Platelets: 254 10*3/uL (ref 150–400)
RBC: 3.53 MIL/uL — ABNORMAL LOW (ref 4.22–5.81)
RDW: 14 % (ref 11.5–15.5)
WBC: 6.9 10*3/uL (ref 4.0–10.5)
nRBC: 0 % (ref 0.0–0.2)

## 2021-06-22 LAB — TYPE AND SCREEN
ABO/RH(D): A POS
Antibody Screen: NEGATIVE

## 2021-06-22 NOTE — Progress Notes (Signed)
PCP - Dr. Luanne Bras Cardiologist - Dr. Geraldo Pitter (pt has not seen him in over 4 years)  PPM/ICD - denies Device Orders - n/a Rep Notified - n/a  Chest x-ray - n/a EKG - 06/22/2021 Stress Test - denies ECHO - Over 4 years ago with Dr. Geraldo Pitter Cardiac Cath - denies  Sleep Study - Yes. Diagnosis of OSA, but states he has not wore CPAP in over a year due to discomfort  Fasting Blood Sugar - n/a  Blood Thinner Instructions: Per patient, he was instructed to stop taking Plavix on 06/26/2021 Aspirin Instructions: n/a  ERAS Protcol - n/a NPO  COVID TEST- n/a   Anesthesia review: CBC done 06/18/2021 and Hgb 9.1. Karoline Caldwell, PA with anesthesia made aware and verbal order given to repeat CBC today. Jeneen Rinks also aware of elevated creatinine and per pt and daughter in law, family is aware that due to advanced kidney disease, this surgery may increase the need to start hemodialysis sooner than expected.   Patient denies shortness of breath, fever, cough and chest pain at PAT appointment   All instructions explained to the patient, with a verbal understanding of the material. Patient agrees to go over the instructions while at home for a better understanding. Patient also instructed to self quarantine after being tested for COVID-19. The opportunity to ask questions was provided.

## 2021-06-24 NOTE — Progress Notes (Addendum)
Anesthesia Chart Review:  Follows with nephrologist [name] at [facility] for history of CKD 5 with recent rapid progression, anemia, orthostatic hypotension with frequent falls. Last seen 06/22/2021 and discussed that he was scheduled for back surgery.  Creatinine at that time 5.15 with GFR of 11.  Per note, patient was educated on ESRD at that time.  [name] did subsequently clear the patient for surgery as moderate risk.  Copy of clearance on chart.  Patient understands that there is high likelihood that this surgery could cause progression of his kidney disease such that he will require dialysis. He says that [name] did discuss this with him at recent visit.  Patient on Plavix for history of possible TIA after an episode of diplopia.  He reports last dose 06/26/2021.  In setting of frequent falls, I advised him to rediscuss with his PCP if this is something he needs to continue given relatively unclear history of TIA.  Preop labs reviewed, creatinine 5.54.  Anemia stable with hemoglobin 10.5.  Remainder of labs unremarkable.  Reviewed history with anesthesiologist Dr. Tobias Alexander. Advised ok to proceed barring acute status change.  EKG 06/22/2021: Normal sinus rhythm.  Rate 69. Minimal voltage criteria for LVH, may be normal variant ( R in aVL ). Cannot rule out Anterior infarct , age undetermined  Event monitor 01/25/17: Baseline rhythm: Sinus   Minimum heart rate: 54 BPM.  Average heart rate: 95 BPM.  Maximal heart rate 152 BPM.   Atrial arrhythmia: Patient had episodes of narrow QRS tachycardia suggesting SVT and the fastest was at 152/min.  The longest run was 23 beats.   Ventricular arrhythmia: None significant    Conduction abnormality: None significant   Symptoms: None significant  Nuclear stress 01/25/17: Nuclear stress EF: 70%. The left ventricular ejection fraction is hyperdynamic (>65%). The study is normal. There is no evidence of ischemia. There is no evidence of previous  infarction. This is a low risk study.  TTE 02/08/2016: Conclusions: 1.  Normal left ventricular chamber size and systolic function, EF >72%.  Impaired diastolic relaxation. 2.  Mild left atrial enlargement. 3.  Normal right heart structures and pressure. 4.  Trileaflet aortic valve without stenosis and with mild regurgitation. 5.  Mild mitral regurgitation. 6.  No pericardial effusion.   Wynonia Musty [facility] Short Stay Center/Anesthesiology Phone [phone] 06/25/2021 10:50 AM

## 2021-06-25 NOTE — Anesthesia Preprocedure Evaluation (Addendum)
Anesthesia Evaluation  Patient identified by MRN, date of birth, ID band Patient awake    Reviewed: Allergy & Precautions, NPO status , Patient's Chart, lab work & pertinent test results  History of Anesthesia Complications Negative for: history of anesthetic complications  Airway Mallampati: II  TM Distance: >3 FB Neck ROM: Full    Dental  (+) Dental Advisory Given, Chipped   Pulmonary sleep apnea , former smoker,    Pulmonary exam normal        Cardiovascular hypertension, Pt. on medications Normal cardiovascular exam   '18 TTE - EF 55-60%. Diastolic dysfunction. Mild LA dilatation. Mild AI, MR. Trace TR.     Neuro/Psych TIAnegative psych ROS   GI/Hepatic negative GI ROS, Neg liver ROS,   Endo/Other  negative endocrine ROS  Renal/GU CRFRenal disease     Musculoskeletal  (+) Arthritis ,   Abdominal   Peds  Hematology  (+) Blood dyscrasia, anemia ,  On plavix, stopped 5 days ago    Anesthesia Other Findings See PAT note   Reproductive/Obstetrics                            Anesthesia Physical Anesthesia Plan  ASA: 3  Anesthesia Plan: General   Post-op Pain Management:    Induction: Intravenous  PONV Risk Score and Plan: 2 and Treatment may vary due to age or medical condition, Ondansetron and Propofol infusion  Airway Management Planned: Oral ETT  Additional Equipment: None  Intra-op Plan:   Post-operative Plan: Extubation in OR  Informed Consent: I have reviewed the patients History and Physical, chart, labs and discussed the procedure including the risks, benefits and alternatives for the proposed anesthesia with the patient or authorized representative who has indicated his/her understanding and acceptance.     Dental advisory given  Plan Discussed with: CRNA and Anesthesiologist  Anesthesia Plan Comments:       Anesthesia Quick Evaluation

## 2021-07-01 ENCOUNTER — Inpatient Hospital Stay (HOSPITAL_COMMUNITY): Payer: Medicare Other

## 2021-07-01 ENCOUNTER — Inpatient Hospital Stay (HOSPITAL_COMMUNITY): Payer: Medicare Other | Admitting: Physician Assistant

## 2021-07-01 ENCOUNTER — Encounter (HOSPITAL_COMMUNITY)
Admission: RE | Disposition: A | Discharge status: Rehabilitation Facility | Payer: Self-pay | Source: Home / Self Care | Attending: Neurosurgery

## 2021-07-01 ENCOUNTER — Other Ambulatory Visit: Payer: Self-pay

## 2021-07-01 ENCOUNTER — Inpatient Hospital Stay (HOSPITAL_COMMUNITY)
Admission: RE | Admit: 2021-07-01 | Discharge: 2021-07-10 | DRG: 454 | Disposition: A | Discharge status: Rehabilitation Facility | Payer: Medicare Other | Attending: Neurosurgery | Admitting: Neurosurgery

## 2021-07-01 ENCOUNTER — Encounter (HOSPITAL_COMMUNITY): Payer: Self-pay | Admitting: Neurosurgery

## 2021-07-01 ENCOUNTER — Inpatient Hospital Stay (HOSPITAL_COMMUNITY): Payer: Medicare Other | Admitting: Certified Registered Nurse Anesthetist

## 2021-07-01 DIAGNOSIS — M48062 Spinal stenosis, lumbar region with neurogenic claudication: Secondary | ICD-10-CM | POA: Diagnosis present

## 2021-07-01 DIAGNOSIS — F03A0 Unspecified dementia, mild, without behavioral disturbance, psychotic disturbance, mood disturbance, and anxiety: Secondary | ICD-10-CM | POA: Diagnosis present

## 2021-07-01 DIAGNOSIS — M4726 Other spondylosis with radiculopathy, lumbar region: Secondary | ICD-10-CM | POA: Diagnosis present

## 2021-07-01 DIAGNOSIS — Z66 Do not resuscitate: Secondary | ICD-10-CM | POA: Diagnosis not present

## 2021-07-01 DIAGNOSIS — F05 Delirium due to known physiological condition: Secondary | ICD-10-CM | POA: Diagnosis not present

## 2021-07-01 DIAGNOSIS — R339 Retention of urine, unspecified: Secondary | ICD-10-CM | POA: Diagnosis not present

## 2021-07-01 DIAGNOSIS — N185 Chronic kidney disease, stage 5: Secondary | ICD-10-CM | POA: Diagnosis present

## 2021-07-01 DIAGNOSIS — N319 Neuromuscular dysfunction of bladder, unspecified: Secondary | ICD-10-CM | POA: Diagnosis present

## 2021-07-01 DIAGNOSIS — K592 Neurogenic bowel, not elsewhere classified: Secondary | ICD-10-CM | POA: Diagnosis present

## 2021-07-01 DIAGNOSIS — Z8249 Family history of ischemic heart disease and other diseases of the circulatory system: Secondary | ICD-10-CM

## 2021-07-01 DIAGNOSIS — Z7902 Long term (current) use of antithrombotics/antiplatelets: Secondary | ICD-10-CM

## 2021-07-01 DIAGNOSIS — Z886 Allergy status to analgesic agent status: Secondary | ICD-10-CM

## 2021-07-01 DIAGNOSIS — Z880 Allergy status to penicillin: Secondary | ICD-10-CM

## 2021-07-01 DIAGNOSIS — M4804 Spinal stenosis, thoracic region: Secondary | ICD-10-CM | POA: Diagnosis present

## 2021-07-01 DIAGNOSIS — Z7189 Other specified counseling: Secondary | ICD-10-CM

## 2021-07-01 DIAGNOSIS — I129 Hypertensive chronic kidney disease with stage 1 through stage 4 chronic kidney disease, or unspecified chronic kidney disease: Secondary | ICD-10-CM | POA: Diagnosis not present

## 2021-07-01 DIAGNOSIS — N179 Acute kidney failure, unspecified: Secondary | ICD-10-CM | POA: Diagnosis present

## 2021-07-01 DIAGNOSIS — R509 Fever, unspecified: Secondary | ICD-10-CM | POA: Diagnosis not present

## 2021-07-01 DIAGNOSIS — N401 Enlarged prostate with lower urinary tract symptoms: Secondary | ICD-10-CM | POA: Diagnosis not present

## 2021-07-01 DIAGNOSIS — G4733 Obstructive sleep apnea (adult) (pediatric): Secondary | ICD-10-CM | POA: Diagnosis present

## 2021-07-01 DIAGNOSIS — N189 Chronic kidney disease, unspecified: Secondary | ICD-10-CM | POA: Diagnosis not present

## 2021-07-01 DIAGNOSIS — E785 Hyperlipidemia, unspecified: Secondary | ICD-10-CM | POA: Diagnosis present

## 2021-07-01 DIAGNOSIS — M1990 Unspecified osteoarthritis, unspecified site: Secondary | ICD-10-CM | POA: Diagnosis present

## 2021-07-01 DIAGNOSIS — Z833 Family history of diabetes mellitus: Secondary | ICD-10-CM | POA: Diagnosis not present

## 2021-07-01 DIAGNOSIS — Z888 Allergy status to other drugs, medicaments and biological substances status: Secondary | ICD-10-CM

## 2021-07-01 DIAGNOSIS — Z4789 Encounter for other orthopedic aftercare: Secondary | ICD-10-CM | POA: Diagnosis present

## 2021-07-01 DIAGNOSIS — M5116 Intervertebral disc disorders with radiculopathy, lumbar region: Secondary | ICD-10-CM | POA: Diagnosis present

## 2021-07-01 DIAGNOSIS — E876 Hypokalemia: Secondary | ICD-10-CM | POA: Diagnosis not present

## 2021-07-01 DIAGNOSIS — I10 Essential (primary) hypertension: Secondary | ICD-10-CM | POA: Diagnosis present

## 2021-07-01 DIAGNOSIS — R338 Other retention of urine: Secondary | ICD-10-CM | POA: Diagnosis not present

## 2021-07-01 DIAGNOSIS — D62 Acute posthemorrhagic anemia: Secondary | ICD-10-CM | POA: Diagnosis present

## 2021-07-01 DIAGNOSIS — Z981 Arthrodesis status: Secondary | ICD-10-CM | POA: Diagnosis not present

## 2021-07-01 DIAGNOSIS — M4716 Other spondylosis with myelopathy, lumbar region: Secondary | ICD-10-CM | POA: Diagnosis not present

## 2021-07-01 DIAGNOSIS — D631 Anemia in chronic kidney disease: Secondary | ICD-10-CM | POA: Diagnosis present

## 2021-07-01 DIAGNOSIS — Z8673 Personal history of transient ischemic attack (TIA), and cerebral infarction without residual deficits: Secondary | ICD-10-CM | POA: Diagnosis not present

## 2021-07-01 DIAGNOSIS — R000 Tachycardia, unspecified: Secondary | ICD-10-CM | POA: Diagnosis not present

## 2021-07-01 DIAGNOSIS — E440 Moderate protein-calorie malnutrition: Secondary | ICD-10-CM | POA: Diagnosis not present

## 2021-07-01 DIAGNOSIS — Z79899 Other long term (current) drug therapy: Secondary | ICD-10-CM

## 2021-07-01 DIAGNOSIS — I120 Hypertensive chronic kidney disease with stage 5 chronic kidney disease or end stage renal disease: Secondary | ICD-10-CM | POA: Diagnosis present

## 2021-07-01 DIAGNOSIS — Z87891 Personal history of nicotine dependence: Secondary | ICD-10-CM | POA: Diagnosis not present

## 2021-07-01 DIAGNOSIS — Z885 Allergy status to narcotic agent status: Secondary | ICD-10-CM

## 2021-07-01 DIAGNOSIS — Z515 Encounter for palliative care: Secondary | ICD-10-CM | POA: Diagnosis not present

## 2021-07-01 DIAGNOSIS — R531 Weakness: Secondary | ICD-10-CM | POA: Diagnosis not present

## 2021-07-01 DIAGNOSIS — M5106 Intervertebral disc disorders with myelopathy, lumbar region: Secondary | ICD-10-CM | POA: Diagnosis not present

## 2021-07-01 DIAGNOSIS — D72823 Leukemoid reaction: Secondary | ICD-10-CM | POA: Diagnosis not present

## 2021-07-01 DIAGNOSIS — F0390 Unspecified dementia without behavioral disturbance: Secondary | ICD-10-CM | POA: Diagnosis present

## 2021-07-01 HISTORY — DX: Unspecified dementia, unspecified severity, without behavioral disturbance, psychotic disturbance, mood disturbance, and anxiety: F03.90

## 2021-07-01 HISTORY — DX: Chronic kidney disease, stage 5: N18.5

## 2021-07-01 HISTORY — DX: Delirium due to known physiological condition: F05

## 2021-07-01 HISTORY — DX: Disorientation, unspecified: R41.0

## 2021-07-01 SURGERY — POSTERIOR LUMBAR FUSION 1 LEVEL
Anesthesia: General | Site: Spine Lumbar

## 2021-07-01 MED ORDER — SODIUM CHLORIDE 0.9 % IV SOLN: 250.0000 mL | INTRAVENOUS | Status: DC

## 2021-07-01 MED ORDER — LACTATED RINGERS IV SOLN: INTRAVENOUS | Status: DC

## 2021-07-01 MED ORDER — ACETAMINOPHEN 500 MG PO TABS
1000.0000 mg | ORAL_TABLET | Freq: Four times a day (QID) | ORAL | Status: AC
  Administered 2021-07-01 – 2021-07-02 (×4): 1000 mg via ORAL
  Filled 2021-07-01 (×4): qty 2

## 2021-07-01 MED ORDER — FENTANYL CITRATE (PF) 250 MCG/5ML IJ SOLN
INTRAMUSCULAR | Status: DC | PRN
Start: 2021-07-01 — End: 2021-07-01
  Administered 2021-07-01 (×4): 50 ug via INTRAVENOUS

## 2021-07-01 MED ORDER — CHLORHEXIDINE GLUCONATE 0.12 % MT SOLN
15.0000 mL | Freq: Once | OROMUCOSAL | Status: AC
  Administered 2021-07-01: 15 mL via OROMUCOSAL
  Filled 2021-07-01: qty 15

## 2021-07-01 MED ORDER — ONDANSETRON HCL 4 MG/2ML IJ SOLN
INTRAMUSCULAR | Status: DC | PRN
  Administered 2021-07-01: 4 mg via INTRAVENOUS

## 2021-07-01 MED ORDER — EPHEDRINE 5 MG/ML INJ
INTRAVENOUS | Status: AC
  Filled 2021-07-01: qty 5

## 2021-07-01 MED ORDER — BACITRACIN ZINC 500 UNIT/GM EX OINT
TOPICAL_OINTMENT | CUTANEOUS | Status: DC | PRN
  Administered 2021-07-01: 1 via TOPICAL

## 2021-07-01 MED ORDER — BISACODYL 10 MG RE SUPP
10.0000 mg | Freq: Every day | RECTAL | Status: DC | PRN
  Administered 2021-07-08: 10 mg via RECTAL
  Filled 2021-07-01: qty 1

## 2021-07-01 MED ORDER — DEXAMETHASONE SODIUM PHOSPHATE 10 MG/ML IJ SOLN
INTRAMUSCULAR | Status: DC | PRN
  Administered 2021-07-01: 10 mg via INTRAVENOUS

## 2021-07-01 MED ORDER — VANCOMYCIN HCL IN DEXTROSE 1-5 GM/200ML-% IV SOLN: 1000.0000 mg | Freq: Once | INTRAVENOUS | Status: DC

## 2021-07-01 MED ORDER — THROMBIN 5000 UNITS EX SOLR
CUTANEOUS | Status: AC
  Filled 2021-07-01: qty 5000

## 2021-07-01 MED ORDER — BUPIVACAINE LIPOSOME 1.3 % IJ SUSP
INTRAMUSCULAR | Status: DC | PRN
  Administered 2021-07-01: 20 mL

## 2021-07-01 MED ORDER — EPHEDRINE SULFATE-NACL 50-0.9 MG/10ML-% IV SOSY
PREFILLED_SYRINGE | INTRAVENOUS | Status: DC | PRN
  Administered 2021-07-01 (×3): 5 mg via INTRAVENOUS

## 2021-07-01 MED ORDER — ALLOPURINOL 100 MG PO TABS
100.0000 mg | ORAL_TABLET | Freq: Every day | ORAL | Status: DC
  Administered 2021-07-02 – 2021-07-10 (×9): 100 mg via ORAL
  Filled 2021-07-01 (×9): qty 1

## 2021-07-01 MED ORDER — ONDANSETRON HCL 4 MG/2ML IJ SOLN: 4.0000 mg | Freq: Four times a day (QID) | INTRAMUSCULAR | Status: DC | PRN

## 2021-07-01 MED ORDER — ACETAMINOPHEN 650 MG RE SUPP: 650.0000 mg | RECTAL | Status: DC | PRN

## 2021-07-01 MED ORDER — MORPHINE SULFATE (PF) 4 MG/ML IV SOLN: 4.0000 mg | INTRAVENOUS | Status: DC | PRN

## 2021-07-01 MED ORDER — 0.9 % SODIUM CHLORIDE (POUR BTL) OPTIME
TOPICAL | Status: DC | PRN
  Administered 2021-07-01: 1000 mL

## 2021-07-01 MED ORDER — HYDROMORPHONE HCL 2 MG PO TABS
ORAL_TABLET | ORAL | Status: AC
  Filled 2021-07-01: qty 1

## 2021-07-01 MED ORDER — HYDROMORPHONE HCL 2 MG PO TABS
2.0000 mg | ORAL_TABLET | ORAL | Status: DC | PRN
  Administered 2021-07-01 – 2021-07-05 (×5): 2 mg via ORAL
  Filled 2021-07-01 (×4): qty 1

## 2021-07-01 MED ORDER — CYCLOBENZAPRINE HCL 10 MG PO TABS
10.0000 mg | ORAL_TABLET | Freq: Three times a day (TID) | ORAL | Status: DC | PRN
  Administered 2021-07-01 – 2021-07-10 (×9): 10 mg via ORAL
  Filled 2021-07-01 (×9): qty 1

## 2021-07-01 MED ORDER — DEXAMETHASONE SODIUM PHOSPHATE 10 MG/ML IJ SOLN
INTRAMUSCULAR | Status: AC
  Filled 2021-07-01: qty 1

## 2021-07-01 MED ORDER — BUPIVACAINE LIPOSOME 1.3 % IJ SUSP
INTRAMUSCULAR | Status: AC
Start: 2021-07-01 — End: ?
  Filled 2021-07-01: qty 20

## 2021-07-01 MED ORDER — FENTANYL CITRATE (PF) 100 MCG/2ML IJ SOLN: 25.0000 ug | INTRAMUSCULAR | Status: DC | PRN

## 2021-07-01 MED ORDER — FUROSEMIDE 40 MG PO TABS
40.0000 mg | ORAL_TABLET | Freq: Every day | ORAL | Status: DC
  Administered 2021-07-02 – 2021-07-06 (×5): 40 mg via ORAL
  Filled 2021-07-01 (×5): qty 1

## 2021-07-01 MED ORDER — ROCURONIUM BROMIDE 10 MG/ML (PF) SYRINGE
PREFILLED_SYRINGE | INTRAVENOUS | Status: AC
  Filled 2021-07-01: qty 10

## 2021-07-01 MED ORDER — TAMSULOSIN HCL 0.4 MG PO CAPS
0.4000 mg | ORAL_CAPSULE | Freq: Every day | ORAL | Status: DC
  Administered 2021-07-02 – 2021-07-10 (×9): 0.4 mg via ORAL
  Filled 2021-07-01 (×9): qty 1

## 2021-07-01 MED ORDER — CHLORHEXIDINE GLUCONATE CLOTH 2 % EX PADS: 6.0000 | MEDICATED_PAD | Freq: Once | CUTANEOUS | Status: DC

## 2021-07-01 MED ORDER — MENTHOL 3 MG MT LOZG: 1.0000 | LOZENGE | OROMUCOSAL | Status: DC | PRN

## 2021-07-01 MED ORDER — PROPOFOL 10 MG/ML IV BOLUS
INTRAVENOUS | Status: AC
  Filled 2021-07-01: qty 20

## 2021-07-01 MED ORDER — PHENOL 1.4 % MT LIQD
1.0000 | OROMUCOSAL | Status: DC | PRN
Start: 2021-07-01 — End: 2021-07-10

## 2021-07-01 MED ORDER — AMLODIPINE BESYLATE 2.5 MG PO TABS
2.5000 mg | ORAL_TABLET | Freq: Every day | ORAL | Status: DC
  Administered 2021-07-02 – 2021-07-10 (×9): 2.5 mg via ORAL
  Filled 2021-07-01 (×9): qty 1

## 2021-07-01 MED ORDER — LIDOCAINE 2% (20 MG/ML) 5 ML SYRINGE
INTRAMUSCULAR | Status: DC | PRN
  Administered 2021-07-01: 60 mg via INTRAVENOUS

## 2021-07-01 MED ORDER — ACETAMINOPHEN 325 MG PO TABS
650.0000 mg | ORAL_TABLET | ORAL | Status: DC | PRN
  Administered 2021-07-02 – 2021-07-09 (×5): 650 mg via ORAL
  Filled 2021-07-01 (×5): qty 2

## 2021-07-01 MED ORDER — LIDOCAINE 2% (20 MG/ML) 5 ML SYRINGE
INTRAMUSCULAR | Status: AC
  Filled 2021-07-01: qty 5

## 2021-07-01 MED ORDER — CEFAZOLIN SODIUM 1 G IJ SOLR
INTRAMUSCULAR | Status: AC
Start: 2021-07-01 — End: ?
  Filled 2021-07-01: qty 20

## 2021-07-01 MED ORDER — SUGAMMADEX SODIUM 200 MG/2ML IV SOLN
INTRAVENOUS | Status: DC | PRN
  Administered 2021-07-01: 200 mg via INTRAVENOUS

## 2021-07-01 MED ORDER — PROPOFOL 10 MG/ML IV BOLUS
INTRAVENOUS | Status: DC | PRN
  Administered 2021-07-01: 130 mg via INTRAVENOUS
  Administered 2021-07-01: 30 mg via INTRAVENOUS

## 2021-07-01 MED ORDER — ONDANSETRON HCL 4 MG PO TABS: 4.0000 mg | ORAL_TABLET | Freq: Four times a day (QID) | ORAL | Status: DC | PRN

## 2021-07-01 MED ORDER — SODIUM CHLORIDE 0.9% FLUSH: 3.0000 mL | INTRAVENOUS | Status: DC | PRN

## 2021-07-01 MED ORDER — PHENYLEPHRINE HCL-NACL 20-0.9 MG/250ML-% IV SOLN
INTRAVENOUS | Status: DC | PRN
  Administered 2021-07-01: 25 ug/min via INTRAVENOUS

## 2021-07-01 MED ORDER — BUPIVACAINE-EPINEPHRINE 0.5% -1:200000 IJ SOLN
INTRAMUSCULAR | Status: AC
  Filled 2021-07-01: qty 1

## 2021-07-01 MED ORDER — GABAPENTIN 100 MG PO CAPS
100.0000 mg | ORAL_CAPSULE | Freq: Three times a day (TID) | ORAL | Status: DC
  Administered 2021-07-01 – 2021-07-04 (×8): 100 mg via ORAL
  Filled 2021-07-01 (×8): qty 1

## 2021-07-01 MED ORDER — ORAL CARE MOUTH RINSE: 15.0000 mL | Freq: Once | OROMUCOSAL | Status: AC

## 2021-07-01 MED ORDER — THROMBIN 5000 UNITS EX SOLR: OROMUCOSAL | Status: DC | PRN

## 2021-07-01 MED ORDER — VANCOMYCIN HCL IN DEXTROSE 1-5 GM/200ML-% IV SOLN
1000.0000 mg | INTRAVENOUS | Status: AC
  Administered 2021-07-01: 1000 mg via INTRAVENOUS
  Filled 2021-07-01: qty 200

## 2021-07-01 MED ORDER — ROCURONIUM BROMIDE 10 MG/ML (PF) SYRINGE
PREFILLED_SYRINGE | INTRAVENOUS | Status: DC | PRN
  Administered 2021-07-01: 60 mg via INTRAVENOUS

## 2021-07-01 MED ORDER — ONDANSETRON HCL 4 MG/2ML IJ SOLN: 4.0000 mg | Freq: Once | INTRAMUSCULAR | Status: DC | PRN

## 2021-07-01 MED ORDER — FENTANYL CITRATE (PF) 250 MCG/5ML IJ SOLN
INTRAMUSCULAR | Status: AC
  Filled 2021-07-01: qty 5

## 2021-07-01 MED ORDER — ONDANSETRON HCL 4 MG/2ML IJ SOLN
INTRAMUSCULAR | Status: AC
  Filled 2021-07-01: qty 2

## 2021-07-01 MED ORDER — BACITRACIN ZINC 500 UNIT/GM EX OINT
TOPICAL_OINTMENT | CUTANEOUS | Status: AC
Start: 2021-07-01 — End: ?
  Filled 2021-07-01: qty 28.35

## 2021-07-01 MED ORDER — DOCUSATE SODIUM 100 MG PO CAPS
100.0000 mg | ORAL_CAPSULE | Freq: Two times a day (BID) | ORAL | Status: DC
  Administered 2021-07-01 – 2021-07-10 (×17): 100 mg via ORAL
  Filled 2021-07-01 (×16): qty 1

## 2021-07-01 MED ORDER — SODIUM CHLORIDE 0.9 % IV SOLN: INTRAVENOUS | Status: DC | PRN

## 2021-07-01 MED ORDER — SODIUM CHLORIDE 0.9% FLUSH
3.0000 mL | Freq: Two times a day (BID) | INTRAVENOUS | Status: DC
  Administered 2021-07-02 – 2021-07-10 (×17): 3 mL via INTRAVENOUS

## 2021-07-01 MED ORDER — ATORVASTATIN CALCIUM 10 MG PO TABS
10.0000 mg | ORAL_TABLET | Freq: Every day | ORAL | Status: DC
  Administered 2021-07-02 – 2021-07-09 (×8): 10 mg via ORAL
  Filled 2021-07-01 (×9): qty 1

## 2021-07-01 MED ORDER — BUPIVACAINE-EPINEPHRINE (PF) 0.5% -1:200000 IJ SOLN
INTRAMUSCULAR | Status: DC | PRN
  Administered 2021-07-01: 10 mL

## 2021-07-01 SURGICAL SUPPLY — 62 items
APL SKNCLS STERI-STRIP NONHPOA (GAUZE/BANDAGES/DRESSINGS) ×1
BAG COUNTER SPONGE SURGICOUNT (BAG) ×2 IMPLANT
BAG SPNG CNTER NS LX DISP (BAG) ×1
BASKET BONE COLLECTION (BASKET) ×2 IMPLANT
BENZOIN TINCTURE PRP APPL 2/3 (GAUZE/BANDAGES/DRESSINGS) ×2 IMPLANT
BUR MATCHSTICK NEURO 3.0 LAGG (BURR) ×3 IMPLANT
BUR PRECISION FLUTE 6.0 (BURR) ×2 IMPLANT
CANISTER SUCT 3000ML PPV (MISCELLANEOUS) ×2 IMPLANT
CAP REVERE LOCKING (Cap) ×8 IMPLANT
CARTRIDGE OIL MAESTRO DRILL (MISCELLANEOUS) ×1 IMPLANT
CLAMP R REVERE OFFSET CONN (Clamp) IMPLANT
CLAMP SINGLE ROD TO ROD (Clamp) ×2 IMPLANT
CNTNR URN SCR LID CUP LEK RST (MISCELLANEOUS) ×1 IMPLANT
CONT SPEC 4OZ STRL OR WHT (MISCELLANEOUS) ×2
COVER BACK TABLE 60X90IN (DRAPES) ×2 IMPLANT
DIFFUSER DRILL AIR PNEUMATIC (MISCELLANEOUS) ×2 IMPLANT
DRAPE C-ARM 42X72 X-RAY (DRAPES) ×4 IMPLANT
DRAPE HALF SHEET 40X57 (DRAPES) ×2 IMPLANT
DRAPE LAPAROTOMY 100X72X124 (DRAPES) ×2 IMPLANT
DRSG OPSITE POSTOP 4X10 (GAUZE/BANDAGES/DRESSINGS) ×1 IMPLANT
DRSG OPSITE POSTOP 4X6 (GAUZE/BANDAGES/DRESSINGS) ×2 IMPLANT
ELECT BLADE 4.0 EZ CLEAN MEGAD (MISCELLANEOUS) ×2
ELECT REM PT RETURN 9FT ADLT (ELECTROSURGICAL) ×2
ELECTRODE BLDE 4.0 EZ CLN MEGD (MISCELLANEOUS) ×1 IMPLANT
ELECTRODE REM PT RTRN 9FT ADLT (ELECTROSURGICAL) ×1 IMPLANT
EVACUATOR 1/8 PVC DRAIN (DRAIN) IMPLANT
GLOVE BIO SURGEON STRL SZ8 (GLOVE) ×4 IMPLANT
GLOVE BIO SURGEON STRL SZ8.5 (GLOVE) ×4 IMPLANT
GLOVE EXAM NITRILE XL STR (GLOVE) IMPLANT
GOWN STRL REUS W/ TWL LRG LVL3 (GOWN DISPOSABLE) IMPLANT
GOWN STRL REUS W/ TWL XL LVL3 (GOWN DISPOSABLE) ×2 IMPLANT
GOWN STRL REUS W/TWL 2XL LVL3 (GOWN DISPOSABLE) IMPLANT
GOWN STRL REUS W/TWL LRG LVL3 (GOWN DISPOSABLE)
GOWN STRL REUS W/TWL XL LVL3 (GOWN DISPOSABLE) ×4
GRAFT TRINITY ELITE LGE HUMAN (Tissue) ×1 IMPLANT
HEMOSTAT POWDER KIT SURGIFOAM (HEMOSTASIS) ×3 IMPLANT
KIT BASIN OR (CUSTOM PROCEDURE TRAY) ×2 IMPLANT
KIT GRAFTMAG DEL NEURO DISP (NEUROSURGERY SUPPLIES) ×1 IMPLANT
KIT POSITION SURG JACKSON T1 (MISCELLANEOUS) ×1 IMPLANT
KIT TURNOVER KIT B (KITS) ×2 IMPLANT
MILL BONE PREP (MISCELLANEOUS) ×2 IMPLANT
NDL HYPO 21X1.5 SAFETY (NEEDLE) IMPLANT
NEEDLE HYPO 21X1.5 SAFETY (NEEDLE) ×2 IMPLANT
NEEDLE HYPO 22GX1.5 SAFETY (NEEDLE) ×2 IMPLANT
NS IRRIG 1000ML POUR BTL (IV SOLUTION) ×2 IMPLANT
OIL CARTRIDGE MAESTRO DRILL (MISCELLANEOUS) ×2
PACK LAMINECTOMY NEURO (CUSTOM PROCEDURE TRAY) ×2 IMPLANT
PUTTY DBM 10CC CALC GRAN (Putty) ×1 IMPLANT
ROD CREO DLX 6.35X200 (Rod) ×1 IMPLANT
ROD Z OFFSET TI 6.35X12X100 (Rod) ×2 IMPLANT
SCREW 7.5X50MM (Screw) ×8 IMPLANT
SCREW REVERE 6.35 7.5X60MM (Screw) IMPLANT
SPACER ALTERA 10X31 8-12MM-8 (Spacer) ×1 IMPLANT
STRIP CLOSURE SKIN 1/2X4 (GAUZE/BANDAGES/DRESSINGS) ×2 IMPLANT
SUT VIC AB 1 CT1 18XBRD ANBCTR (SUTURE) ×2 IMPLANT
SUT VIC AB 1 CT1 8-18 (SUTURE) ×6
SUT VIC AB 2-0 CP2 18 (SUTURE) ×5 IMPLANT
SYR 20ML LL LF (SYRINGE) ×1 IMPLANT
TOWEL GREEN STERILE (TOWEL DISPOSABLE) ×2 IMPLANT
TOWEL GREEN STERILE FF (TOWEL DISPOSABLE) ×2 IMPLANT
TRAY FOLEY MTR SLVR 16FR STAT (SET/KITS/TRAYS/PACK) ×2 IMPLANT
WATER STERILE IRR 1000ML POUR (IV SOLUTION) ×2 IMPLANT

## 2021-07-01 NOTE — H&P (Signed)
Subjective: The patient is an 81 year old white male who has had multiple prior lumbar fusions.  He has developed recurrent back and leg pain consistent with neurogenic claudication.  He has failed medical management and was worked up with a lumbar myelo CT.  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  I discussed the various treatment options with him.  He has decided to proceed with surgery.  Past Medical History:  Diagnosis Date   Arthritis    BPH (benign prostatic hyperplasia)    Chronic kidney disease    Family history of anesthesia complication    mother N/V   Hyperlipemia    Hypertension    Sleep apnea    TIA (transient ischemic attack)     Past Surgical History:  Procedure Laterality Date   BACK SURGERY  05/12/08   CERVICAL DISC SURGERY      Allergies  Allergen Reactions   Nsaids Other (See Comments)    Chronic kidney disease   Penicillins Hives   Percocet [Oxycodone-Acetaminophen] Other (See Comments)    hallucinations   Crestor [Rosuvastatin] Other (See Comments)    Muscle pain    Social History   Tobacco Use   Smoking status: Former    Years: 4.00    Types: Cigarettes    Quit date: 01/17/1973    Years since quitting: 48.4   Smokeless tobacco: Never  Substance Use Topics   Alcohol use: No    Family History  Problem Relation Age of Onset   Diabetes Mother    Heart disease Mother    Heart attack Mother    Prior to Admission medications   Medication Sig Start Date End Date Taking? Authorizing Provider  allopurinol (ZYLOPRIM) 300 MG tablet Take 100 mg by mouth daily.   Yes [provider]  amLODipine (NORVASC) 5 MG tablet Take 2 tablets (10 mg total) by mouth daily. Patient taking differently: Take 2.5 mg by mouth daily. 01/23/17  Yes Revankar, Reita Cliche, MD  Apoaequorin (PREVAGEN PO) Take 1 capsule by mouth daily.   Yes [provider]  Cyanocobalamin (B-12) 2500 MCG TABS Take 2,500 mcg by mouth daily.   Yes [provider]  furosemide (LASIX) 40 MG tablet Take 40 mg by mouth daily.   Yes [provider]  gabapentin (NEURONTIN) 600 MG tablet Take 100 mg by mouth 3 (three) times daily.   Yes [provider]  Omega-3 Fatty Acids (OMEGA 3 PO) Take 1-2 capsules by mouth See admin instructions. Take 2 capsules by mouth in the morning and 1 capsule in the evening   Yes [provider]  OVER THE COUNTER MEDICATION Take 1 capsule by mouth daily. N Balance 8   Yes [provider]  OVER THE COUNTER MEDICATION Take 2 capsules by mouth daily. B Flexible   Yes [provider]  tamsulosin (FLOMAX) 0.4 MG CAPS capsule Take 0.4 mg by mouth daily. 06/08/21  Yes [provider]  TURMERIC CURCUMIN PO Take 1 capsule by mouth daily.   Yes [provider]  atorvastatin (LIPITOR) 10 MG tablet Take 1 tablet (10 mg total) by mouth daily at 6 PM. Patient not taking: Reported on 06/18/2021 01/19/17 04/19/17  Revankar, Reita Cliche, MD  clopidogrel (PLAVIX) 75 MG tablet Take 75 mg by mouth daily.    [provider]     Review of Systems  Positive ROS: As above  All other systems have been reviewed and were otherwise negative with the exception of those  mentioned in the HPI and as above.  Objective: Vital signs in last 24 hours: Temp:  [97.7 F (36.5 C)] 97.7 F (36.5 C) (06/15 1038) Pulse Rate:  [83] 83 (06/15 1038) Resp:  [18] 18 (06/15 1038) BP: (164)/(78) 164/78 (06/15 1038) SpO2:  [97 %] 97 % (06/15 1038) Weight:  [73.9 kg] 73.9 kg (06/15 1038) Estimated body mass index is 25.53 kg/m as calculated from the following:   Height as of this encounter: 5\' 7"  (1.702 m).   Weight as of this encounter: 73.9 kg.   General Appearance: Alert Head: Normocephalic, without obvious abnormality, atraumatic Eyes: PERRL, conjunctiva/corneas clear, EOM's intact,    Ears: Normal  Throat: Normal  Neck: Supple, Back: His lumbar incision is well-healed. Lungs: Clear  to auscultation bilaterally, respirations unlabored Heart: Regular rate and rhythm, no murmur, rub or gallop Abdomen: Soft, non-tender Extremities: Extremities normal, atraumatic, no cyanosis or edema Skin: unremarkable  NEUROLOGIC:   Mental status: alert and oriented,Motor Exam - grossly normal Sensory Exam - grossly normal Reflexes:  Coordination - grossly normal Gait - grossly normal Balance - grossly normal Cranial Nerves: I: smell Not tested  II: visual acuity  OS: Normal  OD: Normal   II: visual fields Full to confrontation  II: pupils Equal, round, reactive to light  III,VII: ptosis None  III,IV,VI: extraocular muscles  Full ROM  V: mastication Normal  V: facial light touch sensation  Normal  V,VII: corneal reflex  Present  VII: facial muscle function - upper  Normal  VII: facial muscle function - lower Normal  VIII: hearing Not tested  IX: soft palate elevation  Normal  IX,X: gag reflex Present  XI: trapezius strength  5/5  XI: sternocleidomastoid strength 5/5  XI: neck flexion strength  5/5  XII: tongue strength  Normal    Data Review Lab Results  Component Value Date   WBC 6.9 06/22/2021   HGB 10.5 (L) 06/22/2021   HCT 33.1 (L) 06/22/2021   MCV 93.8 06/22/2021   PLT 254 06/22/2021   Lab Results  Component Value Date   NA 143 06/22/2021   K 3.6 06/22/2021   CL 109 06/22/2021   CO2 23 06/22/2021   BUN 45 (H) 06/22/2021   CREATININE 5.45 (H) 06/22/2021   GLUCOSE 111 (H) 06/22/2021   No results found for: "INR", "PROTIME"  Assessment/Plan: Lumbar spinal stenosis, lumbago, lumbar radiculopathy, neurogenic claudication: I have discussed the situation with the patient.  I reviewed his imaging studies with him and pointed out the abnormalities.  We have discussed the various treatment options including surgery.  I have described the surgical treatment option of an exploration of his lumbar fusion with an L1-2 decompression with extension of his  instrumentation and fusion to T10.  I have shown him surgical models.  I have given him a surgical pamphlet.  We have discussed the risk, benefits, alternatives, expected postoperative course, and likelihood of achieving our goals with surgery.  I have answered all his questions.  He has decided proceed with surgery.   [name] 07/01/2021 11:58 AM

## 2021-07-01 NOTE — Transfer of Care (Signed)
Immediate Anesthesia Transfer of Care Note  Patient: Chase Evans  Procedure(s) Performed: LUMBAR ONE-TWO POSTERIOR LUMBAR INTERBODY FUSION WITH EXTENSION TO THORACIC TEN (Spine Lumbar)  Patient Location: PACU  Anesthesia Type:General  Level of Consciousness: sedated and responds to stimulation  Airway & Oxygen Therapy: Patient Spontanous Breathing and Patient connected to face mask oxygen, OPA  Post-op Assessment: Report given to RN and Post -op Vital signs reviewed and stable  Post vital signs: Reviewed and stable  Last Vitals:  Vitals Value Taken Time  BP 134/71 07/01/21 1750  Temp 36.1 C 07/01/21 1750  Pulse 81 07/01/21 1754  Resp    SpO2 100 % 07/01/21 1754  Vitals shown include unvalidated device data.  Last Pain:  Vitals:   07/01/21 1058  TempSrc:   PainSc: 0-No pain         Complications: No notable events documented.

## 2021-07-01 NOTE — Progress Notes (Signed)
Pharmacy Antibiotic Note  Chase Evans is a 81 y.o. male admitted on 07/01/2021 with surgical prophylaxis.  Pharmacy has been consulted for Vancomycin dosing.  Pt received Vanc 1gm pre-op. Pt with AKI and est CrCl 10 ml/min.  Plan: No further vancomycin doses needed. Pre-op dose will cover at least 24 hours.  Height: 5\' 7"  (170.2 cm) Weight: 73.9 kg (163 lb) IBW/kg (Calculated) : 66.1  Temp (24hrs), Avg:97.4 F (36.3 C), Min:97 F (36.1 C), Max:97.7 F (36.5 C)  No results for input(s): "WBC", "CREATININE", "LATICACIDVEN", "VANCOTROUGH", "VANCOPEAK", "VANCORANDOM", "GENTTROUGH", "GENTPEAK", "GENTRANDOM", "TOBRATROUGH", "TOBRAPEAK", "TOBRARND", "AMIKACINPEAK", "AMIKACINTROU", "AMIKACIN" in the last 168 hours.  Estimated Creatinine Clearance: 10.1 mL/min (A) (by C-G formula based on SCr of 5.45 mg/dL (H)).    Allergies  Allergen Reactions   Nsaids Other (See Comments)    Chronic kidney disease   Penicillins Hives   Percocet [Oxycodone-Acetaminophen] Other (See Comments)    hallucinations   Crestor [Rosuvastatin] Other (See Comments)    Muscle pain    Thank you for allowing pharmacy to be a part of this patient's care.  Sherlon Handing, PharmD, BCPS Please see amion for complete clinical pharmacist phone list 07/01/2021 8:21 PM

## 2021-07-01 NOTE — Anesthesia Procedure Notes (Addendum)
Procedure Name: Intubation Date/Time: 07/01/2021 1:00 PM  Performed by: Betha Loa, CRNAPre-anesthesia Checklist: Patient identified, Emergency Drugs available, Suction available and Patient being monitored Patient Re-evaluated:Patient Re-evaluated prior to induction Oxygen Delivery Method: Circle System Utilized Preoxygenation: Pre-oxygenation with 100% oxygen Induction Type: IV induction Ventilation: Mask ventilation without difficulty Laryngoscope Size: Mac and 3 Grade View: Grade II Tube type: Oral Tube size: 7.5 mm Number of attempts: 1 Airway Equipment and Method: Stylet and Oral airway Placement Confirmation: ETT inserted through vocal cords under direct vision, positive ETCO2 and breath sounds checked- equal and bilateral Secured at: 22 cm Tube secured with: Tape Dental Injury: Teeth and Oropharynx as per pre-operative assessment  Comments: Pt denies cervical spine issues, states no pain/numbness/tingling with neck flexion or extension

## 2021-07-01 NOTE — Op Note (Signed)
Brief history: The patient is an 81 year old white male whose had multiple prior lumbar fusions.  He has developed recurrent back and leg pain consistent with neurogenic claudication.  He has failed medical management and was worked up with a lumbar MRI which demonstrated spinal stenosis at L1-2.  I discussed the various treatment options with him.  He has decided to proceed with surgery.  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 Etc. requiring a wide decompression of the nerve roots.);  L1-2 transforaminal lumbar interbody fusion with local morselized autograft bone and Zimmer DBM; insertion of interbody prosthesis at L1-2 (globus peek expandable interbody prosthesis); posterior segmental instrumentation from T10 to S1 with globus titanium pedicle screws and rods; posterior lateral arthrodesis at T10-11, T11-12, T12-L1, L1 to with local morselized autograft bone and Zimmer DBM,: Exploration of lumbar fusion.  Surgeon: Dr. Earle Gell  Asst.: Elwin Sleight, MD  Anesthesia: Gen. endotracheal  Estimated blood loss: 250 cc  Drains: None  Complications: None  Description of procedure: The patient was brought to the operating room by the anesthesia team. General endotracheal anesthesia was induced. The patient was turned to the prone position on the Wilson frame. The patient's lumbosacral region was then prepared with Betadine scrub and Betadine solution. Sterile drapes were applied.  I then injected the area to be incised with Marcaine with  epinephrine solution. I then used the scalpel to make a linear midline incision from approximately T10-L3. I then used electrocautery to perform a bilateral subperiosteal dissection exposing the spinous process and lamina of T10-L3, exposing the old lumbar instrumentation.  We then inserted the Verstrac retractor to provide exposure.  I inspected the arthrodesis at L2-3, L3-4, L4-5 and L5-S1.  It appeared solid.  I began the decompression by using the high speed drill to perform laminotomies at L1-2 bilaterally. We then used the Kerrison punches to widen the laminotomy and removed the ligamentum flavum at L1-2 bilaterally. We used the Kerrison punches to remove the facets at 1-2 bilaterally. We performed wide foraminotomies about the bilateral L1 and L2 nerve roots completing the decompression.  We now turned our attention to the posterior lumbar interbody fusion. I used a scalpel to incise the intervertebral disc at L1-2 bilaterally. I then performed a partial intervertebral discectomy at L1-2 bilaterally using the pituitary forceps. We prepared the vertebral endplates at B3-4 bilaterally for the fusion by removing the soft tissues with the curettes. We then used the trial spacers to pick the appropriate sized interbody prosthesis. We prefilled his prosthesis with a combination of local morselized autograft bone that we obtained during the decompression as well as Zimmer DBM. We inserted the prefilled prosthesis into the interspace at L1-2, being careful to stay lateral to the dura, we then turned and expanded the prosthesis. There was a good snug fit of the prosthesis in the interspace. We then filled and the remainder of the intervertebral disc space with local morselized autograft bone and Zimmer DBM. This completed the posterior lumbar interbody arthrodesis.  During the decompression and insertion of the prosthesis the assistant protected the thecal sac and nerve roots with the D'Errico retractor.  We  now turned attention to the instrumentation. Under fluoroscopic guidance we cannulated the bilateral T10, T11, T12, and L1 pedicles with the bone probe. We then removed the bone probe. We then tapped the pedicle with a 6.5 millimeter tap. We then removed the tap. We probed inside the tapped pedicle with a ball probe to rule out cortical breaches. We then inserted a 7.5 x 50 millimeter pedicle screw into the T10, T11, T12, and L1 pedicles bilaterally under fluoroscopic guidance. We then palpated along the medial aspect of the pedicles to rule out cortical breaches. There were none. The nerve roots were not injured. We then connected the unilateral pedicle screws with a rod.  We connected the rod to his old rod using rod connectors.  We then tightened the caps appropriately. This completed the instrumentation from T10-S1.  We now turned our attention to the posterior lateral arthrodesis at T10-11, T11-12, T12-L1, L1-2. We used the high-speed drill to decorticate the remainder of the facets, pars, transverse process at T10-11, T11-12, T12-L1, L1-2. We then applied a combination of local morselized autograft bone and Zimmer DBM over these decorticated posterior lateral structures. This completed the posterior lateral arthrodesis.  We then obtained hemostasis using bipolar electrocautery. We irrigated the wound out with bacitracin solution. We inspected the thecal sac and nerve roots and noted they were well decompressed. We then removed the retractor.  We injected Exparel . We reapproximated patient's thoracolumbar fascia with interrupted #1 Vicryl suture. We reapproximated patient's subcutaneous tissue with interrupted 2-0 Vicryl suture. The reapproximated patient's skin with Steri-Strips and benzoin. The wound was then coated with bacitracin ointment. A sterile dressing was applied. The drapes were removed. The patient was subsequently returned to the supine position where they were extubated by the anesthesia  team. He was then transported to the post anesthesia care unit in stable condition. All sponge instrument and needle counts were reportedly correct at the end of this case.

## 2021-07-01 NOTE — Progress Notes (Signed)
Orthopedic Tech Progress Note Patient Details:  Chase Evans 12/10/1940 [id number]  Ortho Devices Type of Ortho Device: Thoracolumbar corset (TLSO) Ortho Device/Splint Location: Back Ortho Device/Splint Interventions: Ordered  Dropped brace off to nurse.    Chase Evans 07/01/2021, 6:46 PM

## 2021-07-02 LAB — CBC
HCT: 27.8 % — ABNORMAL LOW (ref 39.0–52.0)
Hemoglobin: 9.2 g/dL — ABNORMAL LOW (ref 13.0–17.0)
MCH: 30.5 pg (ref 26.0–34.0)
MCHC: 33.1 g/dL (ref 30.0–36.0)
MCV: 92.1 fL (ref 80.0–100.0)
Platelets: 209 10*3/uL (ref 150–400)
RBC: 3.02 MIL/uL — ABNORMAL LOW (ref 4.22–5.81)
RDW: 13.9 % (ref 11.5–15.5)
WBC: 15.7 10*3/uL — ABNORMAL HIGH (ref 4.0–10.5)
nRBC: 0 % (ref 0.0–0.2)

## 2021-07-02 LAB — BASIC METABOLIC PANEL
Anion gap: 13 (ref 5–15)
BUN: 50 mg/dL — ABNORMAL HIGH (ref 8–23)
CO2: 17 mmol/L — ABNORMAL LOW (ref 22–32)
Calcium: 8.1 mg/dL — ABNORMAL LOW (ref 8.9–10.3)
Chloride: 108 mmol/L (ref 98–111)
Creatinine, Ser: 5.32 mg/dL — ABNORMAL HIGH (ref 0.61–1.24)
GFR, Estimated: 10 mL/min — ABNORMAL LOW (ref >60)
Glucose, Bld: 194 mg/dL — ABNORMAL HIGH (ref 70–99)
Potassium: 4.7 mmol/L (ref 3.5–5.1)
Sodium: 138 mmol/L (ref 135–145)

## 2021-07-02 NOTE — Anesthesia Postprocedure Evaluation (Signed)
Anesthesia Post Note  Patient: Chase Evans  Procedure(s) Performed: LUMBAR ONE-TWO POSTERIOR LUMBAR INTERBODY FUSION WITH EXTENSION TO THORACIC TEN (Spine Lumbar)     Patient location during evaluation: PACU Anesthesia Type: General Level of consciousness: awake and alert Pain management: pain level controlled Vital Signs Assessment: post-procedure vital signs reviewed and stable Respiratory status: spontaneous breathing, nonlabored ventilation, respiratory function stable and patient connected to nasal cannula oxygen Cardiovascular status: blood pressure returned to baseline and stable Postop Assessment: no apparent nausea or vomiting Anesthetic complications: no   No notable events documented.               Effie Berkshire

## 2021-07-02 NOTE — Progress Notes (Signed)
Patient's foley catheter was removed at 0820 this morning prior to PT/OT activity. Attempted to void using a bedside commode two hours after that but was unable to. Encouraged to increase fluid intake on the interim. Bladder scanned at 1545 and volume resulted to 188 ml. Report and findings will be endorsed to receiving RN on [facility], for which the patient will be transferred for longer hospital stay. Will continue to monitor patient.

## 2021-07-02 NOTE — Evaluation (Signed)
Occupational Therapy Evaluation Patient Details Name: Chase Evans MRN: [id number] DOB: 1940/10/02 Today's Date: 07/02/2021   History of Present Illness Pt is an 81 y/o male who presented for L1-2 fusion with extension to T10 in setting of spinal stenosis. PMH: BPH, CKD, HTN, TIA, hx of back sx.   Clinical Impression   PTA, pt lives alone, typically Modified Independent with ADLs, IADLs (aside from recent assist for meds/finances), mobility using cane and driving. Pt reports approx 50 falls in the past 6 months. Pt presents now with deficits in cognition, standing balance and overall strength. Overall, pt requires Mod-Max A for bed mobility and Mod-Max A x 2 for short mobility attempts with RW. With progressive mobility, pt increasing difficulty following sequencing, DME and postural directions with significant assist needed to maintain balance and prevent fall. Delayed BP reading 96/77, suspect BP drop with this task. Pt currently requiring up to Mod A for UB ADL and Max A x 2 for LB ADLs due to deficits. Family present and supportive - plan to take pt home with them after rehab stay. Based on rehab potential, motivation to participate and pt being significantly below functional baseline, recommend AIR level therapies prior to DC.      Recommendations for follow up therapy are one component of a multi-disciplinary discharge planning process, led by the attending physician.  Recommendations may be updated based on patient status, additional functional criteria and insurance authorization.   Follow Up Recommendations  Acute inpatient rehab (3hours/day)    Assistance Recommended at Discharge Frequent or constant Supervision/Assistance  Patient can return home with the following Two people to help with walking and/or transfers;Two people to help with bathing/dressing/bathroom;Assistance with cooking/housework;Direct supervision/assist for medications management;Direct supervision/assist for  financial management;Assist for transportation;Help with stairs or ramp for entrance    Functional Status Assessment  Patient has had a recent decline in their functional status and demonstrates the ability to make significant improvements in function in a reasonable and predictable amount of time.  Equipment Recommendations  Other (comment);BSC/3in1 (Rolling walker)    Recommendations for Other Services Rehab consult     Precautions / Restrictions Precautions Precautions: Fall;Back Precaution Booklet Issued: Yes (comment) Precaution Comments: pt reports 50 falls in 6 months Required Braces or Orthoses: Spinal Brace Spinal Brace: Thoracolumbosacral orthotic;Applied in sitting position Restrictions Weight Bearing Restrictions: No      Mobility Bed Mobility Overal bed mobility: Needs Assistance Bed Mobility: Rolling, Sidelying to Sit, Sit to Sidelying Rolling: Min assist Sidelying to sit: Mod assist     Sit to sidelying: Max assist General bed mobility comments: Step by step cues for log rolling with assist to guide LEs off of bed and pt assisting to lift trunk. Due to difficulty following commands at end of session, pt required increased assist to get BLE and trunk back into bed    Transfers Overall transfer level: Needs assistance Equipment used: Rolling walker (2 wheels) Transfers: Sit to/from Stand Sit to Stand: Mod assist           General transfer comment: Mod A x 1 or Min A x 2 for sit to stand at bedside with cues for hand placement.      Balance Overall balance assessment: Needs assistance Sitting-balance support: No upper extremity supported, Feet supported Sitting balance-Leahy Scale: Fair     Standing balance support: Bilateral upper extremity supported, During functional activity, Reliant on assistive device for balance Standing balance-Leahy Scale: Zero Standing balance comment: Poor progressing to Zero with Max  A x2 to maintain standing balance for  mobility back to bed                           ADL either performed or assessed with clinical judgement   ADL Overall ADL's : Needs assistance/impaired Eating/Feeding: Independent;Sitting   Grooming: Moderate assistance;Standing   Upper Body Bathing: Moderate assistance;Sitting   Lower Body Bathing: Maximal assistance;+2 for physical assistance;+2 for safety/equipment;Sitting/lateral leans;Sit to/from stand   Upper Body Dressing : Moderate assistance;Sitting Upper Body Dressing Details (indicate cue type and reason): Max A for TLSO brace mgmt though feel pt able to manage shirts, etc well Lower Body Dressing: Maximal assistance;+2 for physical assistance;+2 for safety/equipment;Sitting/lateral leans;Sit to/from stand Lower Body Dressing Details (indicate cue type and reason): Pt unable to form figure four position EOB (per son, pt able to do this prior to surgery_ Toilet Transfer: Maximal assistance;+2 for physical assistance;+2 for safety/equipment;Rolling walker (2 wheels);Stand-pivot   Toileting- Clothing Manipulation and Hygiene: Maximal assistance;+2 for physical assistance;+2 for safety/equipment;Sit to/from stand;Sitting/lateral lean       Functional mobility during ADLs: Moderate assistance;Maximal assistance;+2 for physical assistance;+2 for safety/equipment;Rolling walker (2 wheels);Cueing for sequencing;Cueing for safety General ADL Comments: Pt with noted significant weakness, poor standing balance requiring up to Max A x 2 for very short mobility attempts with motor planning, proprioceptive deficits of BLE. Pt previously experiencing falls that did not result in injury and now pt requiring significant assist to prevent falls     Vision Baseline Vision/History: 1 Wears glasses;6 Macular Degeneration Ability to See in Adequate Light: 1 Impaired Patient Visual Report: No change from baseline Vision Assessment?: No apparent visual deficits     Perception      Praxis      Pertinent Vitals/Pain Pain Assessment Pain Assessment: Faces Faces Pain Scale: Hurts even more Pain Location: lower back Pain Descriptors / Indicators: Grimacing, Guarding Pain Intervention(s): Monitored during session, Premedicated before session, Limited activity within patient's tolerance     Hand Dominance Right   Extremity/Trunk Assessment Upper Extremity Assessment Upper Extremity Assessment: Generalized weakness   Lower Extremity Assessment Lower Extremity Assessment: Defer to PT evaluation   Cervical / Trunk Assessment Cervical / Trunk Assessment: Kyphotic;Back Surgery   Communication Communication Communication: No difficulties   Cognition Arousal/Alertness: Awake/alert Behavior During Therapy: WFL for tasks assessed/performed, Impulsive Overall Cognitive Status: Impaired/Different from baseline Area of Impairment: Attention, Following commands, Memory, Safety/judgement, Awareness, Problem solving                   Current Attention Level: Sustained Memory: Decreased short-term memory Following Commands: Follows one step commands with increased time, Follows one step commands inconsistently, Follows one step commands consistently Safety/Judgement: Decreased awareness of safety, Decreased awareness of deficits Awareness: Emergent Problem Solving: Difficulty sequencing, Requires verbal cues, Requires tactile cues, Decreased initiation, Slow processing General Comments: Pt initially alert, quick witted and tangential with conversation at times. Followed commands well for EOB tasks. However, once in standing attempting to mobilize, pt with poor understanding of sequencing RW, taking steps and difficulty following directions (also suspect BP drop hindering ability) with max multimodal cues to get back to bed     General Comments  Family present and supportive    Exercises     Shoulder Instructions      Home Living Family/patient expects to be  discharged to:: Private residence Living Arrangements: Alone Available Help at Discharge: Family Type of Home: House Home Access: Stairs to  enter Entrance Stairs-Number of Steps: 5 Entrance Stairs-Rails: Right;Left Home Layout: One level     Bathroom Shower/Tub: Tub/shower unit;Walk-in shower   Bathroom Toilet: Handicapped height     Home Equipment: [place] - single point;Cane - quad;Shower seat;Grab bars - tub/shower;Hand held shower head   Additional Comments: Family plan to take pt to their home in [facility] after rehab      Prior Functioning/Environment Prior Level of Function : Independent/Modified Independent;Driving;Needs assist       Physical Assist : ADLs (physical)   ADLs (physical): IADLs Mobility Comments: use of cane "50% of the time". pt reports 50 falls in recent 6 months in the home and out in the community. pt typically able to get himself off of the ground after a fall. ADLs Comments: Modified Independent with ADLs (endorses some falls in shower), able to complete household IADLs, drives to bank, grocery store. Family has recently started assisting with med and financial mgmt due to some cognitive concerns.        OT Problem List: Decreased strength;Decreased activity tolerance;Impaired balance (sitting and/or standing);Decreased cognition;Decreased safety awareness;Decreased knowledge of use of DME or AE;Decreased knowledge of precautions      OT Treatment/Interventions: Self-care/ADL training;Therapeutic exercise;Energy conservation;DME and/or AE instruction;Therapeutic activities;Patient/family education;Balance training    OT Goals(Current goals can be found in the care plan section) Acute Rehab OT Goals Patient Stated Goal: rehab to improve independence and decrease fall occurrence OT Goal Formulation: With patient/family Time For Goal Achievement: 07/16/21 Potential to Achieve Goals: Good  OT Frequency: Min 2X/week    Co-evaluation PT/OT/SLP  Co-Evaluation/Treatment: Yes Reason for Co-Treatment: For patient/therapist safety;To address functional/ADL transfers   OT goals addressed during session: ADL's and self-care;Strengthening/ROM      AM-PAC OT "6 Clicks" Daily Activity     Outcome Measure Help from another person eating meals?: None Help from another person taking care of personal grooming?: A Little Help from another person toileting, which includes using toliet, bedpan, or urinal?: A Lot Help from another person bathing (including washing, rinsing, drying)?: A Lot Help from another person to put on and taking off regular upper body clothing?: A Lot Help from another person to put on and taking off regular lower body clothing?: A Lot 6 Click Score: 15   End of Session Equipment Utilized During Treatment: Gait belt;Rolling walker (2 wheels);Back brace Nurse Communication: Mobility status  Activity Tolerance: Patient tolerated treatment well;Treatment limited secondary to medical complications (Comment) Patient left: in bed;with call bell/phone within reach;with bed alarm set;with family/visitor present  OT Visit Diagnosis: Unsteadiness on feet (R26.81);Other abnormalities of gait and mobility (R26.89);Muscle weakness (generalized) (M62.81);History of falling (Z91.81);Repeated falls (R29.6)                Time: 0786-7544 OT Time Calculation (min): 43 min Charges:  OT General Charges $OT Visit: 1 Visit OT Evaluation $OT Eval Moderate Complexity: 1 Mod OT Treatments $Therapeutic Activity: 8-22 mins  Malachy Chamber, OTR/L Acute Rehab Services Office: [phone]   Layla Maw 07/02/2021, 9:19 AM

## 2021-07-02 NOTE — Evaluation (Signed)
Physical Therapy Evaluation Patient Details Name: Chase Evans MRN: [id number] DOB: 13-Sep-1940 Today's Date: 07/02/2021  History of Present Illness  Pt is an 81 y/o male who presented for L1-2 fusion with extension to T10 in setting of spinal stenosis. PMH: BPH, CKD, HTN, TIA, hx of back sx.  Clinical Impression  Patient admitted with the above. PTA, patient lives alone and reports up to 50 falls with family only aware of 20-30 falls. Patient used cane 50% of the time. Patient presents with weakness, impaired cognition, impaired balance, impaired sensation, and decreased activity tolerance.  Patient required mod-maxA for bed mobility and minA+2 for sit to stand. Initially patient tangential in conversation but with progressing mobility, patient with difficulty following commands and conversating. Patient required maxA+2 for short ambulation distance with RW and difficulty sequencing use of RW as well as stepping. Patient with heavy R lateral lean and trunk flexion requiring max multimodal cues and +2 assist. Son and daughter in law were present and very supportive and plan to take patient home after rehab. Patient will benefit from skilled PT services during acute stay to address listed deficits. Recommend AIR at discharge to maximize functional mobility and to decrease burden of care.      Recommendations for follow up therapy are one component of a multi-disciplinary discharge planning process, led by the attending physician.  Recommendations may be updated based on patient status, additional functional criteria and insurance authorization.  Follow Up Recommendations Acute inpatient rehab (3hours/day)    Assistance Recommended at Discharge Frequent or constant Supervision/Assistance  Patient can return home with the following  Two people to help with walking and/or transfers;Two people to help with bathing/dressing/bathroom;Direct supervision/assist for medications management;Direct  supervision/assist for financial management;Assistance with cooking/housework;Assist for transportation;Help with stairs or ramp for entrance    Equipment Recommendations Chase Evans (2 wheels);BSC/3in1  Recommendations for Other Services  Rehab consult    Functional Status Assessment Patient has had a recent decline in their functional status and demonstrates the ability to make significant improvements in function in a reasonable and predictable amount of time.     Precautions / Restrictions Precautions Precautions: Fall;Back Precaution Booklet Issued: Yes (comment) Precaution Comments: pt reports 50 falls in 6 months Required Braces or Orthoses: Spinal Brace Spinal Brace: Thoracolumbosacral orthotic;Applied in sitting position Restrictions Weight Bearing Restrictions: No      Mobility  Bed Mobility Overal bed mobility: Needs Assistance Bed Mobility: Chase, Sidelying to Sit, Sit to Sidelying Chase: Min assist Sidelying to sit: Mod assist     Sit to sidelying: Max assist General bed mobility comments: Step by step cues for log Chase with assist to guide LEs off of bed and pt assisting to lift trunk. Due to difficulty following commands at end of session, pt required increased assist to get BLE and trunk back into bed    Transfers Overall transfer level: Needs assistance Equipment used: Chase Chase Evans (2 wheels) Transfers: Sit to/from Stand Sit to Stand: Mod assist           General transfer comment: Mod A x 1 or Min A x 2 for sit to stand at bedside with cues for hand placement.    Ambulation/Gait Ambulation/Gait assistance: Max assist, +2 physical assistance, +2 safety/equipment Gait Distance (Feet): 6 Feet Assistive device: Chase Chase Evans (2 wheels) Gait Pattern/deviations: Step-to pattern, Decreased step length - right, Decreased step length - left, Decreased stride length, Decreased weight shift to right, Decreased weight shift to left, Knee flexed in  stance -  left, Knee flexed in stance - right, Trunk flexed, Narrow base of support Gait velocity: decreased Gait velocity interpretation: <1.31 ft/sec, indicative of household ambulator   General Gait Details: initially modA+2 for balance, RW management, and cues for sequencing of feet and RW. Patient pushing RW too far out in front requiring physical assist to place RW close. With increasing time, patient requiring maxA+2 and more difficulty following commands. R lateral lean and trunk flexion increasing. Max tactile and verbal cues for sequencing, upright posture, and RW management. Once sitting EOB, BP noted 96/77 and patient with minimal verbalizations  Stairs            Wheelchair Mobility    Modified Rankin (Stroke Patients Only)       Balance Overall balance assessment: Needs assistance Sitting-balance support: No upper extremity supported, Feet supported Sitting balance-Leahy Scale: Fair     Standing balance support: Bilateral upper extremity supported, During functional activity, Reliant on assistive device for balance Standing balance-Leahy Scale: Zero Standing balance comment: Poor progressing to Zero with Max A x2 to maintain standing balance for mobility back to bed                             Pertinent Vitals/Pain Pain Assessment Pain Assessment: Faces Faces Pain Scale: Hurts even more Pain Location: lower back Pain Descriptors / Indicators: Grimacing, Guarding Pain Intervention(s): Monitored during session, Premedicated before session, Limited activity within patient's tolerance    Home Living Family/patient expects to be discharged to:: Private residence Living Arrangements: Alone Available Help at Discharge: Family Type of Home: House Home Access: Stairs to enter Entrance Stairs-Rails: Psychiatric nurse of Steps: 5   Home Layout: One level Home Equipment: Cane - single point;Cane - quad;Shower seat;Grab bars -  tub/shower;Hand held shower head Additional Comments: Family plan to take pt to their home in [facility] after rehab    Prior Function Prior Level of Function : Independent/Modified Independent;Driving;Needs assist       Physical Assist : ADLs (physical)   ADLs (physical): IADLs Mobility Comments: use of cane "50% of the time". pt reports 50 falls in recent 6 months in the home and out in the community. pt typically able to get himself off of the ground after a fall. ADLs Comments: Modified Independent with ADLs (endorses some falls in shower), able to complete household IADLs, drives to bank, grocery store. Family has recently started assisting with med and financial mgmt due to some cognitive concerns.     Hand Dominance   Dominant Hand: Right    Extremity/Trunk Assessment   Upper Extremity Assessment Upper Extremity Assessment: Defer to OT evaluation    Lower Extremity Assessment Lower Extremity Assessment: Generalized weakness (Patient reports numbness in bilateral LEs)    Cervical / Trunk Assessment Cervical / Trunk Assessment: Kyphotic;Back Surgery  Communication   Communication: No difficulties  Cognition Arousal/Alertness: Awake/alert Behavior During Therapy: WFL for tasks assessed/performed, Impulsive Overall Cognitive Status: Impaired/Different from baseline Area of Impairment: Attention, Following commands, Memory, Safety/judgement, Awareness, Problem solving                   Current Attention Level: Sustained Memory: Decreased short-term memory Following Commands: Follows one step commands with increased time, Follows one step commands inconsistently, Follows one step commands consistently Safety/Judgement: Decreased awareness of safety, Decreased awareness of deficits Awareness: Emergent Problem Solving: Difficulty sequencing, Requires verbal cues, Requires tactile cues, Decreased initiation, Slow processing General Comments: Pt  initially alert,  quick witted and tangential with conversation at times. Followed commands well for EOB tasks. However, once in standing attempting to mobilize, pt with poor understanding of sequencing RW, taking steps and difficulty following directions (also suspect BP drop hindering ability) with max multimodal cues to get back to bed        General Comments General comments (skin integrity, edema, etc.): Family present and supportive    Exercises     Assessment/Plan    PT Assessment Patient needs continued PT services  PT Problem List Decreased strength;Decreased balance;Decreased activity tolerance;Decreased mobility;Decreased coordination;Decreased cognition;Decreased knowledge of use of DME;Decreased safety awareness;Decreased knowledge of precautions;Impaired sensation       PT Treatment Interventions DME instruction;Gait training;Functional mobility training;Therapeutic activities;Therapeutic exercise;Balance training;Patient/family education    PT Goals (Current goals can be found in the Care Plan section)  Acute Rehab PT Goals Patient Stated Goal: did not state PT Goal Formulation: With patient/family Time For Goal Achievement: 07/16/21 Potential to Achieve Goals: Fair    Frequency Min 5X/week     Co-evaluation PT/OT/SLP Co-Evaluation/Treatment: Yes Reason for Co-Treatment: For patient/therapist safety;To address functional/ADL transfers PT goals addressed during session: Mobility/safety with mobility;Balance;Proper use of DME OT goals addressed during session: ADL's and self-care;Strengthening/ROM       AM-PAC PT "6 Clicks" Mobility  Outcome Measure Help needed turning from your back to your side while in a flat bed without using bedrails?: A Lot Help needed moving from lying on your back to sitting on the side of a flat bed without using bedrails?: A Lot Help needed moving to and from a bed to a chair (including a wheelchair)?: Total Help needed standing up from a chair using  your arms (e.g., wheelchair or bedside chair)?: Total Help needed to walk in hospital room?: Total Help needed climbing 3-5 steps with a railing? : Total 6 Click Score: 8    End of Session Equipment Utilized During Treatment: Gait belt;Back brace Activity Tolerance: Patient tolerated treatment well Patient left: in bed;with call bell/phone within reach;with bed alarm set Nurse Communication: Mobility status PT Visit Diagnosis: Unsteadiness on feet (R26.81);Muscle weakness (generalized) (M62.81);Difficulty in walking, not elsewhere classified (R26.2);Repeated falls (R29.6);History of falling (Z91.81);Other abnormalities of gait and mobility (R26.89)    Time: 3790-2409 PT Time Calculation (min) (ACUTE ONLY): 45 min   Charges:   PT Evaluation $PT Eval Moderate Complexity: 1 Mod          Larrissa Stivers A. Gilford Rile PT, DPT Acute Rehabilitation Services Office [phone]   Linna Hoff 07/02/2021, 10:05 AM

## 2021-07-02 NOTE — Progress Notes (Signed)
Patient transported to [place] [facility] on a bed with all his belongings by RN and NT; in no acute distress nor complaints of any pain nor discomfort; family members came along with him; report previously given to receiving RN.

## 2021-07-02 NOTE — Progress Notes (Signed)
Subjective: The patient is alert and pleasant.  He is mildly confused.  He is in no apparent distress.  Objective: Vital signs in last 24 hours: Temp:  [97 F (36.1 C)-97.7 F (36.5 C)] 97.5 F (36.4 C) (06/16 0400) Pulse Rate:  [73-100] 100 (06/16 0400) Resp:  [12-19] 18 (06/16 0400) BP: (106-164)/(65-91) 160/91 (06/16 0400) SpO2:  [94 %-100 %] 100 % (06/16 0400) Weight:  [73.9 kg] 73.9 kg (06/15 1038) Estimated body mass index is 25.53 kg/m as calculated from the following:   Height as of this encounter: 5\' 7"  (1.702 m).   Weight as of this encounter: 73.9 kg.   Intake/Output from previous day: 06/15 0701 - 06/16 0700 In: 1200 [I.V.:1200] Out: 835 [Urine:585; Blood:250] Intake/Output this shift: Total I/O In: -  Out: 350 [Urine:350]  Physical exam the patient is alert and pleasant.  He is mildly confused.  He is moving his lower extremities well.  Lab Results: Recent Labs    07/02/21 0502  WBC 15.7*  HGB 9.2*  HCT 27.8*  PLT 209   BMET Recent Labs    07/02/21 0502  NA 138  K 4.7  CL 108  CO2 17*  GLUCOSE 194*  BUN 50*  CREATININE 5.32*  CALCIUM 8.1*    Studies/Results: DG THORACOLUMABAR SPINE  Result Date: 07/01/2021 CLINICAL DATA:  Fluoroscopic assistance for fusion in the lower thoracic and upper lumbar spine EXAM: THORACOLUMBAR SPINE 1V COMPARISON:  05/18/2021 FINDINGS: Fluoroscopic images show surgical fusion from T10-L2 levels. Fluoroscopic time was 23 seconds. Radiation dose 11.3 mGy. IMPRESSION: Fluoroscopic assistance was provided for fusion at thoracolumbar junction. Electronically Signed   By: Elmer Picker M.D.   On: 07/01/2021 16:09   DG C-Arm 1-60 Min-No Report  Result Date: 07/01/2021 Fluoroscopy was utilized by the requesting physician.  No radiographic interpretation.   DG C-Arm 1-60 Min-No Report  Result Date: 07/01/2021 Fluoroscopy was utilized by the requesting physician.  No radiographic interpretation.     Assessment/Plan: Postop day #1: The patient is doing well.  I think he would benefit from inpatient rehab.  I will asked the rehab team to see him.  Renal insufficiency/disease: The patient's creatinine is stable.  I will recheck it tomorrow.  LOS: 1 day     [name] 07/02/2021, 6:50 AM     Patient ID: Chase Evans, male   DOB: July 29, 1940, 81 y.o.   MRN: [id number]

## 2021-07-03 LAB — BASIC METABOLIC PANEL
Anion gap: 8 (ref 5–15)
BUN: 60 mg/dL — ABNORMAL HIGH (ref 8–23)
CO2: 20 mmol/L — ABNORMAL LOW (ref 22–32)
Calcium: 7.7 mg/dL — ABNORMAL LOW (ref 8.9–10.3)
Chloride: 108 mmol/L (ref 98–111)
Creatinine, Ser: 5.38 mg/dL — ABNORMAL HIGH (ref 0.61–1.24)
GFR, Estimated: 10 mL/min — ABNORMAL LOW (ref >60)
Glucose, Bld: 139 mg/dL — ABNORMAL HIGH (ref 70–99)
Potassium: 4.3 mmol/L (ref 3.5–5.1)
Sodium: 136 mmol/L (ref 135–145)

## 2021-07-03 MED ORDER — SODIUM CHLORIDE 0.9 % IV BOLUS
500.0000 mL | Freq: Once | INTRAVENOUS | Status: AC
Start: 2021-07-04 — End: 2021-07-03
  Administered 2021-07-03: 500 mL via INTRAVENOUS

## 2021-07-03 MED ORDER — LORAZEPAM 2 MG/ML IJ SOLN
1.0000 mg | Freq: Four times a day (QID) | INTRAMUSCULAR | Status: DC | PRN
  Administered 2021-07-03 – 2021-07-04 (×2): 1 mg via INTRAVENOUS
  Filled 2021-07-03 (×2): qty 1

## 2021-07-03 NOTE — Progress Notes (Signed)
Physical Therapy Treatment Patient Details Name: Chase Evans MRN: [id number] DOB: 1940/05/17 Today's Date: 07/03/2021   History of Present Illness Pt is an 81 y/o male who presented for L1-2 fusion with extension to T10 in setting of spinal stenosis. PMH: BPH, CKD, HTN, TIA, hx of back sx.    PT Comments    Pt making slow, steady progress with functional mobility. Limited secondary to increased pain. He continues to require mod A for bed mobility and transfers. He was able to take several side steps at EOB with RW and min-mod A from PT. Pt would continue to benefit from skilled physical therapy services at this time while admitted and after d/c to address the below listed limitations in order to improve overall safety and independence with functional mobility.    Recommendations for follow up therapy are one component of a multi-disciplinary discharge planning process, led by the attending physician.  Recommendations may be updated based on patient status, additional functional criteria and insurance authorization.  Follow Up Recommendations  Acute inpatient rehab (3hours/day)     Assistance Recommended at Discharge Frequent or constant Supervision/Assistance  Patient can return home with the following Two people to help with walking and/or transfers;Two people to help with bathing/dressing/bathroom;Direct supervision/assist for medications management;Direct supervision/assist for financial management;Assistance with cooking/housework;Assist for transportation;Help with stairs or ramp for entrance   Equipment Recommendations  Rolling walker (2 wheels);BSC/3in1    Recommendations for Other Services       Precautions / Restrictions Precautions Precautions: Fall;Back Precaution Comments: reviewed back precautions with pt and family members; pt reports 50 falls in past 6 months Required Braces or Orthoses: Spinal Brace Spinal Brace: Thoracolumbosacral orthotic;Applied in sitting  position Restrictions Weight Bearing Restrictions: No     Mobility  Bed Mobility Overal bed mobility: Needs Assistance Bed Mobility: Rolling, Sidelying to Sit Rolling: Mod assist Sidelying to sit: Mod assist       General bed mobility comments: increased time and effort, maximal verbal and tactile cueing, physical assistance needed to roll towards his left side and for trunk elevation to sit upright at EOB    Transfers Overall transfer level: Needs assistance Equipment used: Rolling walker (2 wheels) Transfers: Sit to/from Stand Sit to Stand: Mod assist           General transfer comment: pt performed x2 from EOB with mod A to power up and for stability    Ambulation/Gait               General Gait Details: pt able to take 5-6 side steps at EOB towards his left side with RW and min-mod A; limited secondary to increased pain   Stairs             Wheelchair Mobility    Modified Rankin (Stroke Patients Only)       Balance Overall balance assessment: Needs assistance Sitting-balance support: No upper extremity supported, Feet supported Sitting balance-Leahy Scale: Fair     Standing balance support: During functional activity, No upper extremity supported, Bilateral upper extremity supported, Single extremity supported Standing balance-Leahy Scale: Poor Standing balance comment: pt able to stand and use hand held urinal; however, required min-mod A for stability with and without UE supports on RW                            Cognition Arousal/Alertness: Awake/alert Behavior During Therapy: WFL for tasks assessed/performed, Impulsive Overall Cognitive Status: Impaired/Different from baseline Area  of Impairment: Attention, Memory, Following commands, Safety/judgement, Awareness, Problem solving                   Current Attention Level: Sustained Memory: Decreased short-term memory Following Commands: Follows one step commands with  increased time, Follows one step commands inconsistently, Follows one step commands consistently Safety/Judgement: Decreased awareness of safety, Decreased awareness of deficits Awareness: Emergent Problem Solving: Difficulty sequencing, Requires verbal cues, Requires tactile cues, Decreased initiation, Slow processing          Exercises      General Comments        Pertinent Vitals/Pain Pain Assessment Pain Assessment: Faces Faces Pain Scale: Hurts even more Pain Location: lower back Pain Descriptors / Indicators: Grimacing, Guarding Pain Intervention(s): Monitored during session, Repositioned    Home Living                          Prior Function            PT Goals (current goals can now be found in the care plan section) Acute Rehab PT Goals PT Goal Formulation: With patient/family Time For Goal Achievement: 07/16/21 Potential to Achieve Goals: Fair Progress towards PT goals: Progressing toward goals    Frequency    Min 5X/week      PT Plan Current plan remains appropriate    Co-evaluation              AM-PAC PT "6 Clicks" Mobility   Outcome Measure  Help needed turning from your back to your side while in a flat bed without using bedrails?: A Lot Help needed moving from lying on your back to sitting on the side of a flat bed without using bedrails?: A Lot Help needed moving to and from a bed to a chair (including a wheelchair)?: A Lot Help needed standing up from a chair using your arms (e.g., wheelchair or bedside chair)?: A Lot Help needed to walk in hospital room?: A Lot Help needed climbing 3-5 steps with a railing? : Total 6 Click Score: 11    End of Session Equipment Utilized During Treatment: Gait belt Activity Tolerance: Patient limited by pain;Patient limited by fatigue Patient left: in bed;with call bell/phone within reach;with bed alarm set;with family/visitor present;Other (comment) (sitting upright at EOB with tray table  in front of him) Nurse Communication: Mobility status PT Visit Diagnosis: Unsteadiness on feet (R26.81);Muscle weakness (generalized) (M62.81);Difficulty in walking, not elsewhere classified (R26.2);Repeated falls (R29.6);History of falling (Z91.81);Other abnormalities of gait and mobility (R26.89)     Time: 3016-0109 PT Time Calculation (min) (ACUTE ONLY): 43 min  Charges:  $Therapeutic Activity: 38-52 mins                     Anastasio Champion, DPT  Acute Rehabilitation Services Office [place] 07/03/2021, 11:00 AM

## 2021-07-03 NOTE — Progress Notes (Signed)
Inpatient Rehab Admissions:  Inpatient Rehab Consult received.  I met with patient, son Gunnar and daughter-in-law at the bedside for rehabilitation assessment and to discuss goals and expectations of an inpatient rehab admission.  All acknowledged understanding of CIR goals and expectations. Pt would like to pursue CIR and son and daughter-in-law are supportive. Son and daughter-in-law confirmed that pt will come live with them after discharge.  Will continue to follow.  Signed: Gayland Curry, [place], [place] Admissions Coordinator [phone]

## 2021-07-03 NOTE — Progress Notes (Signed)
Patient ID: Chase Evans, male   DOB: 1940/08/28, 81 y.o.   MRN: [id number] Vital signs are stable Patient appears to have some sundowning episodes Nursing had called me and concerned about his agitation after his family leaves I have written for some Ativan We will see how he does with this medication this evening

## 2021-07-03 NOTE — PMR Pre-admission (Shared)
PMR Admission Coordinator Pre-Admission Assessment  Patient: Chase Evans is an 81 y.o., male MRN: [id number] DOB: [birth year] Height: 5' 7"  (170.2 cm) Weight: 73.9 kg  Insurance Information HMO:     PPO:      PCP:      IPA:      80/20: yes     OTHER:  PRIMARY: Medicare A & B      Policy#: [id number]      Subscriber: patient CM Name:       Phone#:      Fax#:  Pre-Cert#:       Employer:  Benefits:  Phone #: verified eligibility via [employer] on 07/03/21     Name:  Eff. Date: Part A & B effective 91/07     Deduct: $1,600      Out of Pocket Max: NA      Life Max: NA CIR: 100% coverage      SNF: 100% coverage days 1-20, 80% coverage days 21-100 Outpatient: 80% coverage     Co-Pay: 20% Home Health: 100% coverage      Co-Pay:  DME: 80% coverage     Co-Pay: 20% Providers: pt's choice SECONDARY: BCBS Supplement    Policy#: [id number]     Phone#: [phone]  Financial Counselor:       Phone#:   The "Data Collection Information Summary" for patients in Inpatient Rehabilitation Facilities with attached "Privacy Act Watergate Records" was provided and verbally reviewed with: {CHL IP Patient Family DX:833825053}  Emergency Contact Information Contact Information     Name Relation Home Work Mobile   [place] Relative   [phone]   Braylon, Grenda [phone]     [facility] Relative [phone]     Latrelle, Bazar   [phone]       Current Medical History  Patient Admitting Diagnosis: 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 Pt presented to [facility] on 07/01/21 for scheduled lumbar fusion surgery. 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 Therapy evaluations completed and CIR recommended d/t pt's  deficits in functional mobility and inability to complete ADLs independently.***    Patient's medical record from [facility] has been reviewed by the rehabilitation admission coordinator and physician.  Past Medical History  Past Medical History:  Diagnosis Date   Arthritis    BPH (benign prostatic hyperplasia)    Chronic kidney disease    Family history of anesthesia complication    mother N/V   Hyperlipemia    Hypertension    Sleep apnea    TIA (transient ischemic attack)     Has the patient had major surgery during 100 days prior to admission? Yes  Family History   family history includes Diabetes in his mother; Heart attack in his mother; Heart disease in his mother.  Current Medications  Current Facility-Administered Medications:    0.9 %  sodium chloride infusion, 250 mL, Intravenous, Continuous, Newman Pies, MD   acetaminophen (TYLENOL) tablet 650 mg, 650 mg, Oral, Q4H PRN, 650 mg at 07/03/21 1133 **OR** acetaminophen (TYLENOL) suppository 650 mg, 650 mg, Rectal, Q4H PRN, Newman Pies, MD   allopurinol (ZYLOPRIM) tablet 100 mg, 100 mg, Oral, Daily, Newman Pies, MD, 100 mg at 07/03/21 0803   amLODipine (NORVASC) tablet 2.5 mg, 2.5 mg, Oral, Daily,  Newman Pies, MD, 2.5 mg at 07/03/21 0803   atorvastatin (LIPITOR) tablet 10 mg, 10 mg, Oral, q1800, Newman Pies, MD, 10 mg at 07/02/21 1646   bisacodyl (DULCOLAX) suppository 10 mg, 10 mg, Rectal, Daily PRN, Newman Pies, MD   cyclobenzaprine (FLEXERIL) tablet 10 mg, 10 mg, Oral, TID PRN, Newman Pies, MD, 10 mg at 07/03/21 1133   docusate sodium (COLACE) capsule 100 mg, 100 mg, Oral, BID, Newman Pies, MD, 100 mg at 07/03/21 0804   furosemide (LASIX) tablet 40 mg, 40 mg, Oral, Daily, Newman Pies, MD, 40 mg at 07/03/21 0804   gabapentin (NEURONTIN) capsule 100 mg, 100 mg, Oral, TID, Newman Pies, MD, 100 mg at 07/03/21 0805   HYDROmorphone (DILAUDID) tablet 2 mg, 2 mg, Oral,  Q2H PRN, Newman Pies, MD, 2 mg at 07/01/21 2318   menthol-cetylpyridinium (CEPACOL) lozenge 3 mg, 1 lozenge, Oral, PRN **OR** phenol (CHLORASEPTIC) mouth spray 1 spray, 1 spray, Mouth/Throat, PRN, Newman Pies, MD   ondansetron [facility]) tablet 4 mg, 4 mg, Oral, Q6H PRN **OR** ondansetron (ZOFRAN) injection 4 mg, 4 mg, Intravenous, Q6H PRN, Newman Pies, MD   sodium chloride flush (NS) 0.9 % injection 3 mL, 3 mL, Intravenous, Q12H, Newman Pies, MD, 3 mL at 07/03/21 0805   sodium chloride flush (NS) 0.9 % injection 3 mL, 3 mL, Intravenous, PRN, Newman Pies, MD   tamsulosin [facility]) capsule 0.4 mg, 0.4 mg, Oral, Daily, Newman Pies, MD, 0.4 mg at 07/03/21 0805  Patients Current Diet:  Diet Order             Diet regular Room service appropriate? Yes; Fluid consistency: Thin  Diet effective now                   Precautions / Restrictions Precautions Precautions: Fall, Back Precaution Booklet Issued: Yes (comment) Precaution Comments: reviewed back precautions with pt and family members; pt reports 59 falls in past 6 months Spinal Brace: Thoracolumbosacral orthotic, Applied in sitting position Restrictions Weight Bearing Restrictions: No   Has the patient had 2 or more falls or a fall with injury in the past year? Yes  Prior Activity Level Community (5-7x/wk): gets out of house 5-6 days/week  Prior Functional Level Self Care: Did the patient need help bathing, dressing, using the toilet or eating? Independent  Indoor Mobility: Did the patient need assistance with walking from room to room (with or without device)? Independent  Stairs: Did the patient need assistance with internal or external stairs (with or without device)? Independent  Functional Cognition: Did the patient need help planning regular tasks such as shopping or remembering to take medications? Independent but pt admitted to being forgetful regarding medication  Patient Information Are  you of Hispanic, Latino/a,or Spanish origin?: A. No, not of Hispanic, Latino/a, or Spanish origin What is your race?: A. White Do you need or want an interpreter to communicate with a doctor or health care staff?: 0. No  Patient's Response To:  Health Literacy and Transportation Is the patient able to respond to health literacy and transportation needs?: Yes Health Literacy - How often do you need to have someone help you when you read instructions, pamphlets, or other written material from your doctor or pharmacy?: Never In the past 12 months, has lack of transportation kept you from medical appointments or from getting medications?: No In the past 12 months, has lack of transportation kept you from meetings, work, or from getting things needed for daily living?: No  Home Assistive Devices /  Equipment Home Equipment: Kasandra Knudsen - single point, [employer] - quad, Civil engineer, contracting, Grab bars - tub/shower, Hand held shower head  Prior Device Use: Indicate devices/aids used by the patient prior to current illness, exacerbation or injury?  cane  Current Functional Level Cognition  Overall Cognitive Status: Impaired/Different from baseline Current Attention Level: Sustained Orientation Level: Oriented X4 Following Commands: Follows one step commands with increased time, Follows one step commands inconsistently, Follows one step commands consistently Safety/Judgement: Decreased awareness of safety, Decreased awareness of deficits General Comments: Pt initially alert, quick witted and tangential with conversation at times. Followed commands well for EOB tasks. However, once in standing attempting to mobilize, pt with poor understanding of sequencing RW, taking steps and difficulty following directions (also suspect BP drop hindering ability) with max multimodal cues to get back to bed    Extremity Assessment (includes Sensation/Coordination)  Upper Extremity Assessment: Defer to OT evaluation  Lower Extremity  Assessment: Generalized weakness (Patient reports numbness in bilateral LEs)    ADLs  Overall ADL's : Needs assistance/impaired Eating/Feeding: Independent, Sitting Grooming: Moderate assistance, Standing Upper Body Bathing: Moderate assistance, Sitting Lower Body Bathing: Maximal assistance, +2 for physical assistance, +2 for safety/equipment, Sitting/lateral leans, Sit to/from stand Upper Body Dressing : Moderate assistance, Sitting Upper Body Dressing Details (indicate cue type and reason): Max A for TLSO brace mgmt though feel pt able to manage shirts, etc well Lower Body Dressing: Maximal assistance, +2 for physical assistance, +2 for safety/equipment, Sitting/lateral leans, Sit to/from stand Lower Body Dressing Details (indicate cue type and reason): Pt unable to form figure four position EOB (per son, pt able to do this prior to surgery_ Toilet Transfer: Maximal assistance, +2 for physical assistance, +2 for safety/equipment, Rolling walker (2 wheels), Stand-pivot Toileting- Clothing Manipulation and Hygiene: Maximal assistance, +2 for physical assistance, +2 for safety/equipment, Sit to/from stand, Sitting/lateral lean Functional mobility during ADLs: Moderate assistance, Maximal assistance, +2 for physical assistance, +2 for safety/equipment, Rolling walker (2 wheels), Cueing for sequencing, Cueing for safety General ADL Comments: Pt with noted significant weakness, poor standing balance requiring up to Max A x 2 for very short mobility attempts with motor planning, proprioceptive deficits of BLE. Pt previously experiencing falls that did not result in injury and now pt requiring significant assist to prevent falls    Mobility  Overal bed mobility: Needs Assistance Bed Mobility: Rolling, Sidelying to Sit Rolling: Mod assist Sidelying to sit: Mod assist Sit to sidelying: Max assist General bed mobility comments: increased time and effort, maximal verbal and tactile cueing, physical  assistance needed to roll towards his left side and for trunk elevation to sit upright at EOB    Transfers  Overall transfer level: Needs assistance Equipment used: Rolling walker (2 wheels) Transfers: Sit to/from Stand Sit to Stand: Mod assist General transfer comment: pt performed x2 from EOB with mod A to power up and for stability    Ambulation / Gait / Stairs / Wheelchair Mobility  Ambulation/Gait Ambulation/Gait assistance: Max assist, +2 physical assistance, +2 safety/equipment Gait Distance (Feet): 6 Feet Assistive device: Rolling walker (2 wheels) Gait Pattern/deviations: Step-to pattern, Decreased step length - right, Decreased step length - left, Decreased stride length, Decreased weight shift to right, Decreased weight shift to left, Knee flexed in stance - left, Knee flexed in stance - right, Trunk flexed, Narrow base of support General Gait Details: pt able to take 5-6 side steps at EOB towards his left side with RW and min-mod A; limited secondary to increased pain  Gait velocity: decreased Gait velocity interpretation: <1.31 ft/sec, indicative of household ambulator    Posture / Balance Balance Overall balance assessment: Needs assistance Sitting-balance support: No upper extremity supported, Feet supported Sitting balance-Leahy Scale: Fair Standing balance support: During functional activity, No upper extremity supported, Bilateral upper extremity supported, Single extremity supported Standing balance-Leahy Scale: Poor Standing balance comment: pt able to stand and use hand held urinal; however, required min-mod A for stability with and without UE supports on RW    Special needs/care consideration Continuous Drip IV  0.9% sodium chloride infusion and Skin Surgical incision: back   Previous Home Environment (from acute therapy documentation) Living Arrangements: Alone Available Help at Discharge: Family, Available 24 hours/day Type of Home: House Home Layout: One  level Home Access: Stairs to enter Entrance Stairs-Rails: Right Entrance Stairs-Number of Steps: 5 Bathroom Shower/Tub: Multimedia programmer: Handicapped height Bathroom Accessibility: Yes How Accessible: Accessible via walker Additional Comments: Family plan to take pt to their home in [place] after rehab  Discharge Living Setting Plans for Discharge Living Setting: House Type of Home at Discharge: House (son and daughter-in-law's house) Discharge Home Layout: Two level, Able to live on main level with bedroom/bathroom Discharge Home Access: Stairs to enter Entrance Stairs-Rails: Can reach both Entrance Stairs-Number of Steps: 5 Discharge Bathroom Shower/Tub: Walk-in shower Discharge Bathroom Toilet: Standard Discharge Bathroom Accessibility: Yes How Accessible: Accessible via walker Does the patient have any problems obtaining your medications?: No  Social/Family/Support Systems Anticipated Caregiver: Rashawn Rayman (son) and daughter-in-law Anticipated Caregiver's Contact Information: [phone] Caregiver Availability: 24/7 Discharge Plan Discussed with Primary Caregiver: Yes Is Caregiver In Agreement with Plan?: Yes Does Caregiver/Family have Issues with Lodging/Transportation while Pt is in Rehab?: No  Goals Patient/Family Goal for Rehab: *** Expected length of stay: *** Pt/Family Agrees to Admission and willing to participate: Yes Program Orientation Provided & Reviewed with Pt/Caregiver Including Roles  & Responsibilities: Yes  Decrease burden of Care through IP rehab admission: NA  Possible need for SNF placement upon discharge: Not anticipated  Patient Condition: I have reviewed medical records from [facility], spoken with CM, and patient, son, and family member. I met with patient at the bedside for inpatient rehabilitation assessment.  Patient will benefit from ongoing PT and OT, can actively participate in 3 hours of therapy a day 5  days of the week, and can make measurable gains during the admission.  Patient will also benefit from the coordinated team approach during an Inpatient Acute Rehabilitation admission.  The patient will receive intensive therapy as well as Rehabilitation physician, nursing, social worker, and care management interventions.  Due to safety, skin/wound care, disease management, medication administration, pain management, and patient education the patient requires 24 hour a day rehabilitation nursing.  The patient is currently *** with mobility and basic ADLs.  Discharge setting and therapy post discharge at home with home health is anticipated.  Patient has agreed to participate in the Acute Inpatient Rehabilitation Program and will admit {Time; today/tomorrow:10263}.  Preadmission Screen Completed By:  Bethel Born, 07/03/2021 5:16 PM ______________________________________________________________________   Discussed status with Dr. Marland Kitchen on *** at *** and received approval for admission today.  Admission Coordinator:  Bethel Born, CCC-SLP, time ***/Date ***   Assessment/Plan: Diagnosis: Does the need for close, 24 hr/day Medical supervision in concert with the patient's rehab needs make it unreasonable for this patient to be served in a less intensive setting? {yes_no_potentially:3041433} Co-Morbidities requiring supervision/potential complications: *** Due  to {due AV:0094461}, does the patient require 24 hr/day rehab nursing? {yes_no_potentially:3041433} Does the patient require coordinated care of a physician, rehab nurse, PT, OT, and SLP to address physical and functional deficits in the context of the above medical diagnosis(es)? {yes_no_potentially:3041433} Addressing deficits in the following areas: {deficits:3041436} Can the patient actively participate in an intensive therapy program of at least 3 hrs of therapy 5 days a week? {yes_no_potentially:3041433} The potential for  patient to make measurable gains while on inpatient rehab is {potential:3041437} Anticipated functional outcomes upon discharge from inpatient rehab: {functional outcomes:304600100} PT, {functional outcomes:304600100} OT, {functional outcomes:304600100} SLP Estimated rehab length of stay to reach the above functional goals is: *** Anticipated discharge destination: {anticipated dc setting:21604} 10. Overall Rehab/Functional Prognosis: {potential:3041437}   MD Signature: ***

## 2021-07-03 NOTE — Progress Notes (Signed)
Spoke with Dr. Ellene Route about patient having increased agitation/confusion and hallucinations. PRN Ativan ordered.

## 2021-07-04 ENCOUNTER — Encounter (HOSPITAL_COMMUNITY): Payer: Self-pay | Admitting: Neurosurgery

## 2021-07-04 DIAGNOSIS — N185 Chronic kidney disease, stage 5: Secondary | ICD-10-CM | POA: Diagnosis present

## 2021-07-04 DIAGNOSIS — D62 Acute posthemorrhagic anemia: Secondary | ICD-10-CM | POA: Diagnosis present

## 2021-07-04 DIAGNOSIS — M48062 Spinal stenosis, lumbar region with neurogenic claudication: Secondary | ICD-10-CM

## 2021-07-04 DIAGNOSIS — F05 Delirium due to known physiological condition: Secondary | ICD-10-CM | POA: Diagnosis not present

## 2021-07-04 DIAGNOSIS — I10 Essential (primary) hypertension: Secondary | ICD-10-CM | POA: Diagnosis present

## 2021-07-04 DIAGNOSIS — Z7189 Other specified counseling: Secondary | ICD-10-CM

## 2021-07-04 LAB — BASIC METABOLIC PANEL
Anion gap: 10 (ref 5–15)
BUN: 59 mg/dL — ABNORMAL HIGH (ref 8–23)
CO2: 20 mmol/L — ABNORMAL LOW (ref 22–32)
Calcium: 8 mg/dL — ABNORMAL LOW (ref 8.9–10.3)
Chloride: 109 mmol/L (ref 98–111)
Creatinine, Ser: 5.38 mg/dL — ABNORMAL HIGH (ref 0.61–1.24)
GFR, Estimated: 10 mL/min — ABNORMAL LOW (ref >60)
Glucose, Bld: 100 mg/dL — ABNORMAL HIGH (ref 70–99)
Potassium: 4.1 mmol/L (ref 3.5–5.1)
Sodium: 139 mmol/L (ref 135–145)

## 2021-07-04 LAB — CBC
HCT: 24.2 % — ABNORMAL LOW (ref 39.0–52.0)
Hemoglobin: 7.6 g/dL — ABNORMAL LOW (ref 13.0–17.0)
MCH: 29.5 pg (ref 26.0–34.0)
MCHC: 31.4 g/dL (ref 30.0–36.0)
MCV: 93.8 fL (ref 80.0–100.0)
Platelets: 239 10*3/uL (ref 150–400)
RBC: 2.58 MIL/uL — ABNORMAL LOW (ref 4.22–5.81)
RDW: 14.6 % (ref 11.5–15.5)
WBC: 15 10*3/uL — ABNORMAL HIGH (ref 4.0–10.5)
nRBC: 0 % (ref 0.0–0.2)

## 2021-07-04 MED ORDER — LABETALOL HCL 5 MG/ML IV SOLN
10.0000 mg | INTRAVENOUS | Status: DC | PRN
  Administered 2021-07-04 – 2021-07-08 (×10): 10 mg via INTRAVENOUS
  Filled 2021-07-04 (×10): qty 4

## 2021-07-04 MED ORDER — LABETALOL HCL 5 MG/ML IV SOLN
INTRAVENOUS | Status: AC
  Administered 2021-07-04: 10 mg via INTRAVENOUS
  Filled 2021-07-04: qty 4

## 2021-07-04 MED ORDER — HALOPERIDOL LACTATE 5 MG/ML IJ SOLN
2.0000 mg | Freq: Four times a day (QID) | INTRAMUSCULAR | Status: DC | PRN
Start: 2021-07-04 — End: 2021-07-05
  Administered 2021-07-04: 2 mg via INTRAVENOUS
  Filled 2021-07-04 (×2): qty 1

## 2021-07-04 NOTE — Progress Notes (Signed)
PT Cancellation Note  Patient Details Name: Chase Evans MRN: [id number] DOB: December 11, 1940   Cancelled Treatment:    Reason Eval/Treat Not Completed: Medical issues which prohibited therapy. Pt with delirium/agitation/sedation. Will continue to follow.    Shary Decamp [facility] 07/04/2021, 11:45 AM [place] Office [phone]

## 2021-07-04 NOTE — Assessment & Plan Note (Signed)
-  Continue Lipitor °

## 2021-07-04 NOTE — Assessment & Plan Note (Addendum)
-  Patient with baseline anemia associated with CKD -Hgb dropped from 10.5 pre-operatively to 9.2 on the day after surgery to 7.6 today -Hopefully he has now equilibrated and will not continue to drop -Will continue to follow daily (no overt bleeding to suggest need to follow more frequently)  -Transfuse for Hgb <7

## 2021-07-04 NOTE — Assessment & Plan Note (Signed)
-  Patient is very independent at home and does not appreciate the sick role -His family reports that he has a living will and they believe it says he would prefer to be DNR -His daughter-in-law has a copy in the car and has been asked to bring it in for further review/scanning into his chart

## 2021-07-04 NOTE — Consult Note (Signed)
Initial Consultation Note   Patient: Chase Evans YEM:[id number] DOB: 1940/11/08 PCP: Allean Found, MD DOA: 07/01/2021 DOS: the patient was seen and examined on 07/04/2021 Primary service: Newman Pies, MD  Referring physician: Ellene Route - neurosurgery Reason for consult: Big surgery with T10-L2 fusion, mild confusion after surgery.  Given Ativan yesterday for agitation.  Creatinine 5 pre-operatively.  Hgb 7.6, 10.5 on 6/6.      Assessment and Plan: * Spinal stenosis of lumbar region with neurogenic claudication -Patient with prior spinal surgery remotely, presenting with neuropathy and severe back pain -He is now s/p T10-L2 fusion, surgery on 6/15 -Management per neurosurgery -He appears likely to need CIR  Delirium superimposed on dementia -Per family, he is starting to have baseline memory issues; his father also had dementia -He also has h/o sundowning during hospitalizations -He is quite altered currently -He was given Ativan last night and this AM -Will attempt to avoid medication, but if needed for agitation would give low-dose Haldol instead -Delirium precautions ordered -As noted below, this appears unlikely to be currently related to uremia - but if not clearing could consider nephrology consult -Avoid nephrotoxic agents -Hold Neurontin for now  Acute postoperative anemia due to expected blood loss -Patient with baseline anemia associated with CKD -Hgb dropped from 10.5 pre-operatively to 9.2 on the day after surgery to 7.6 today -Hopefully he has now equilibrated and will not continue to drop -Will continue to follow daily (no overt bleeding to suggest need to follow more frequently)  -Transfuse for Hgb <7  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[place]) -Chronic stage 5 CKD -Pre-operatively he met with both nephrology and anesthesia and understood the risk of progression to ESRD -His creatinine appears to be stable currently so his currently AMS appears less likely  to be related to uremia -Will follow both clinically and with daily BMP for now -He is followed by outpatient nephrology and does not appear to have need for inpatient consult at this time  Goals of care, counseling/discussion -Patient is very independent at home and does not appreciate the sick role -His family reports that he has a living will and they believe it says he would prefer to be DNR -His daughter-in-law has a copy in the car and has been asked to bring it in for further review/scanning into his chart  Hypertension -Continue Norvasc  Hyperlipidemia -Continue Lipitor       TRH will continue to follow the patient.  HPI: Chase Evans is a 81 y.o. male with past medical history of BPH; stage 5 CKD; HTN; HLD; TIA; and OSA who presented for back surgery.  He was cleared for surgery pre-operatively by his nephrologist and noted to be at risk for progression to ESRD.  This was discussed with him pre-operatively by nephrology as well as anesthesiology.  He is currently mostly unable to speak and still delirious but family reports that he was given Ativan this AM for agitation and is slowly coming around.  He has mild dementia at baseline and has had delirium during most hospitalizations in the past.   Review of Systems: unable to review all systems due to the inability of the patient to answer questions. Past Medical History:  Diagnosis Date   Arthritis    BPH (benign prostatic hyperplasia)    Family history of anesthesia complication    mother N/V   Hyperlipemia    Hypertension    Sleep apnea    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[place])  TIA (transient ischemic attack)    Past Surgical History:  Procedure Laterality Date   BACK SURGERY  05/12/08   CERVICAL DISC SURGERY     Social History:  reports that he quit smoking about 48 years ago. His smoking use included cigarettes. He has never used smokeless tobacco. He reports that he does not drink alcohol and does not  use drugs.  Allergies  Allergen Reactions   Nsaids Other (See Comments)    Chronic kidney disease   Penicillins Hives   Percocet [Oxycodone-Acetaminophen] Other (See Comments)    hallucinations   Crestor [Rosuvastatin] Other (See Comments)    Muscle pain    Family History  Problem Relation Age of Onset   Diabetes Mother    Heart disease Mother    Heart attack Mother     Prior to Admission medications   Medication Sig Start Date End Date Taking? Authorizing Provider  allopurinol (ZYLOPRIM) 300 MG tablet Take 100 mg by mouth daily.   Yes [provider]  amLODipine (NORVASC) 5 MG tablet Take 2 tablets (10 mg total) by mouth daily. Patient taking differently: Take 2.5 mg by mouth daily. 01/23/17  Yes Revankar, Reita Cliche, MD  Apoaequorin (PREVAGEN PO) Take 1 capsule by mouth daily.   Yes [provider]  Cyanocobalamin (B-12) 2500 MCG TABS Take 2,500 mcg by mouth daily.   Yes [provider]  furosemide (LASIX) 40 MG tablet Take 40 mg by mouth daily.   Yes [provider]  gabapentin (NEURONTIN) 600 MG tablet Take 100 mg by mouth 3 (three) times daily.   Yes [provider]  Omega-3 Fatty Acids (OMEGA 3 PO) Take 1-2 capsules by mouth See admin instructions. Take 2 capsules by mouth in the morning and 1 capsule in the evening   Yes [provider]  OVER THE COUNTER MEDICATION Take 1 capsule by mouth daily. N Balance 8   Yes [provider]  OVER THE COUNTER MEDICATION Take 2 capsules by mouth daily. B Flexible   Yes [provider]  tamsulosin (FLOMAX) 0.4 MG CAPS capsule Take 0.4 mg by mouth daily. 06/08/21  Yes [provider]  TURMERIC CURCUMIN PO Take 1 capsule by mouth daily.   Yes [provider]  atorvastatin (LIPITOR) 10 MG tablet Take 1 tablet (10 mg total) by mouth daily at 6 PM. Patient not taking: Reported on 06/18/2021 01/19/17 04/19/17  Revankar, Reita Cliche, MD  clopidogrel (PLAVIX) 75 MG tablet  Take 75 mg by mouth daily.    [provider]    Physical Exam: Vitals:   07/04/21 0944 07/04/21 1014 07/04/21 1044 07/04/21 1334  BP: (!) 156/101 (!) 162/93 (!) 164/82 (!) 145/94  Pulse:    (!) 116  Resp: 19 (!) 28 12 15   Temp:      TempSrc:      SpO2:    91%  Weight:      Height:       General:  Alert, attempted to answer a few questions but whispered responses were unintelligible Eyes:  normal lids, iris ENT:  grossly normal hearing, lips & tongue Neck:  no LAD, masses or thyromegaly Cardiovascular:  RR with mild tachycardia, no m/r/g. No LE edema.  Respiratory:   CTA bilaterally with no wheezes/rales/rhonchi.  Normal respiratory effort. Abdomen:  soft, NT, ND Skin:  no rash or induration seen on limited exam Musculoskeletal:  moves both feet, no bony abnormality Psychiatric:  alert but delirious, unable to follow commands, attempts  to respond to some questions with unintelligible speech Neurologic:  unable to effectively perform   Radiological Exams on Admission: Independently reviewed - see discussion in A/P where applicable  No results found.  EKG: none since 6/6   Labs on Admission: I have personally reviewed the available labs and imaging studies at the time of the admission.  Pertinent labs:    CO2 20 Glucose 111, 194, 139, 100 BUN 59/Creatinine 5.38/GFR 10 - stable WBC 15 Hgb 7.6; 10.5 on 6/6, 9.2 on 6/16   Family Communication: Son and daughter-in-law were present throughout evaluation  Primary team communication: I spoke with Dr. Ellene Route by telephone at the time of the consult request  Thank you very much for involving [place] in the care of your patient.  Author: Karmen Bongo, MD 07/04/2021 2:23 PM  For on call review [email or web address].

## 2021-07-04 NOTE — Assessment & Plan Note (Addendum)
-  Chronic stage 5 CKD -Pre-operatively he met with both nephrology and anesthesia and understood the risk of progression to ESRD -His creatinine appears to be stable currently so his currently AMS appears less likely to be related to uremia -Will follow both clinically and with daily BMP for now -He is followed by outpatient nephrology and does not appear to have need for inpatient consult at this time

## 2021-07-04 NOTE — Plan of Care (Signed)
Pt oriented x 2-4, pt has been restless, prn ativan given. Pt noted to grimace in pain, prn pain medication give. Pt has been repositioned in bed. HR 120s-130s, on call provider Elsner MD informed. Received order for 527ml bolus.  Pt had elevated HR up to 130s, BP elevated. PRN labetalol given per order wit effective results.  Head of the bed elevated.  No distress noted.     Problem: Education: Goal: Knowledge of General Education information will improve Description: Including pain rating scale, medication(s)/side effects and non-pharmacologic comfort measures Outcome: Progressing   Problem: Health Behavior/Discharge Planning: Goal: Ability to manage health-related needs will improve Outcome: Progressing   Problem: Clinical Measurements: Goal: Ability to maintain clinical measurements within normal limits will improve Outcome: Progressing Goal: Will remain free from infection Outcome: Progressing Goal: Diagnostic test results will improve Outcome: Progressing Goal: Respiratory complications will improve Outcome: Progressing Goal: Cardiovascular complication will be avoided Outcome: Progressing   Problem: Activity: Goal: Risk for activity intolerance will decrease Outcome: Progressing   Problem: Nutrition: Goal: Adequate nutrition will be maintained Outcome: Progressing   Problem: Coping: Goal: Level of anxiety will decrease Outcome: Progressing   Problem: Elimination: Goal: Will not experience complications related to bowel motility Outcome: Progressing Goal: Will not experience complications related to urinary retention Outcome: Progressing   Problem: Pain Managment: Goal: General experience of comfort will improve Outcome: Progressing   Problem: Safety: Goal: Ability to remain free from injury will improve Outcome: Progressing   Problem: Skin Integrity: Goal: Risk for impaired skin integrity will decrease Outcome: Progressing   Problem: Education: Goal:  Ability to verbalize activity precautions or restrictions will improve Outcome: Progressing Goal: Knowledge of the prescribed therapeutic regimen will improve Outcome: Progressing Goal: Understanding of discharge needs will improve Outcome: Progressing   Problem: Activity: Goal: Ability to avoid complications of mobility impairment will improve Outcome: Progressing Goal: Ability to tolerate increased activity will improve Outcome: Progressing Goal: Will remain free from falls Outcome: Progressing   Problem: Bowel/Gastric: Goal: Gastrointestinal status for postoperative course will improve Outcome: Progressing   Problem: Clinical Measurements: Goal: Ability to maintain clinical measurements within normal limits will improve Outcome: Progressing Goal: Postoperative complications will be avoided or minimized Outcome: Progressing Goal: Diagnostic test results will improve Outcome: Progressing   Problem: Clinical Measurements: Goal: Postoperative complications will be avoided or minimized Outcome: Progressing   Problem: Pain Management: Goal: Pain level will decrease Outcome: Progressing   Problem: Bladder/Genitourinary: Goal: Urinary functional status for postoperative course will improve Outcome: Progressing

## 2021-07-04 NOTE — Assessment & Plan Note (Addendum)
-  Per family, he is starting to have baseline memory issues; his father also had dementia -He also has h/o sundowning during hospitalizations -He is quite altered currently -He was given Ativan last night and this AM -Will attempt to avoid medication, but if needed for agitation would give low-dose Haldol instead -Delirium precautions ordered -As noted below, this appears unlikely to be currently related to uremia - but if not clearing could consider nephrology consult -Avoid nephrotoxic agents -Hold Neurontin for now

## 2021-07-04 NOTE — Assessment & Plan Note (Signed)
Continue Norvasc

## 2021-07-04 NOTE — Assessment & Plan Note (Addendum)
-  Patient with prior spinal surgery remotely, presenting with neuropathy and severe back pain -He is now s/p T10-L2 fusion, surgery on 6/15 -Management per neurosurgery -He appears likely to need CIR

## 2021-07-04 NOTE — Progress Notes (Signed)
Patient ID: Chase Evans, male   DOB: Apr 07, 1940, 81 y.o.   MRN: [id number] Patient has been agitated when not sedated with Ativan New labs demonstrate that his hematocrit is 24 and his creatinine has been above 5 since before the surgery  Currently he arouses to painful stimulation but will not follow commands and will not speak he is moving all 4 extremities I will ask the hospitalist to see him regarding management of his renal failure and his anemia and to help with managing medications for his delirium and agitation. I have spoken with the patient's family who are in the room today

## 2021-07-05 DIAGNOSIS — Z7189 Other specified counseling: Secondary | ICD-10-CM

## 2021-07-05 DIAGNOSIS — N185 Chronic kidney disease, stage 5: Secondary | ICD-10-CM | POA: Diagnosis not present

## 2021-07-05 DIAGNOSIS — M48062 Spinal stenosis, lumbar region with neurogenic claudication: Secondary | ICD-10-CM | POA: Diagnosis not present

## 2021-07-05 DIAGNOSIS — Z515 Encounter for palliative care: Secondary | ICD-10-CM

## 2021-07-05 LAB — BASIC METABOLIC PANEL
Anion gap: 11 (ref 5–15)
BUN: 64 mg/dL — ABNORMAL HIGH (ref 8–23)
CO2: 21 mmol/L — ABNORMAL LOW (ref 22–32)
Calcium: 8 mg/dL — ABNORMAL LOW (ref 8.9–10.3)
Chloride: 107 mmol/L (ref 98–111)
Creatinine, Ser: 5.65 mg/dL — ABNORMAL HIGH (ref 0.61–1.24)
GFR, Estimated: 10 mL/min — ABNORMAL LOW (ref >60)
Glucose, Bld: 113 mg/dL — ABNORMAL HIGH (ref 70–99)
Potassium: 4.3 mmol/L (ref 3.5–5.1)
Sodium: 139 mmol/L (ref 135–145)

## 2021-07-05 LAB — CBC
HCT: 23.2 % — ABNORMAL LOW (ref 39.0–52.0)
Hemoglobin: 7.7 g/dL — ABNORMAL LOW (ref 13.0–17.0)
MCH: 30.4 pg (ref 26.0–34.0)
MCHC: 33.2 g/dL (ref 30.0–36.0)
MCV: 91.7 fL (ref 80.0–100.0)
Platelets: 220 10*3/uL (ref 150–400)
RBC: 2.53 MIL/uL — ABNORMAL LOW (ref 4.22–5.81)
RDW: 14.4 % (ref 11.5–15.5)
WBC: 13.2 10*3/uL — ABNORMAL HIGH (ref 4.0–10.5)
nRBC: 0 % (ref 0.0–0.2)

## 2021-07-05 MED ORDER — FERROUS SULFATE 325 (65 FE) MG PO TABS
325.0000 mg | ORAL_TABLET | Freq: Two times a day (BID) | ORAL | Status: DC
  Administered 2021-07-05 – 2021-07-10 (×11): 325 mg via ORAL
  Filled 2021-07-05 (×11): qty 1

## 2021-07-05 NOTE — Progress Notes (Signed)
Subjective: The patient is alert and pleasant.  He is mildly confused.  Objective: Vital signs in last 24 hours: Temp:  [98 F (36.7 C)-98.9 F (37.2 C)] 98.6 F (37 C) (06/19 0423) Pulse Rate:  [113-118] 113 (06/19 0423) Resp:  [12-28] 19 (06/19 0423) BP: (145-182)/(80-101) 156/81 (06/19 0423) SpO2:  [91 %-98 %] 98 % (06/19 0423) Estimated body mass index is 25.53 kg/m as calculated from the following:   Height as of this encounter: 5\' 7"  (1.702 m).   Weight as of this encounter: 73.9 kg.   Intake/Output from previous day: 06/18 0701 - 06/19 0700 In: 633 [P.O.:840; I.V.:3] Out: 1825 [Urine:1825] Intake/Output this shift: No intake/output data recorded.  Physical exam the patient is alert and pleasant.  He is moving all 4 extremities well.  His wound is healing well.  Lab Results: Recent Labs    07/04/21 0811 07/05/21 0344  WBC 15.0* 13.2*  HGB 7.6* 7.7*  HCT 24.2* 23.2*  PLT 239 220   BMET Recent Labs    07/04/21 0811 07/05/21 0344  NA 139 139  K 4.1 4.3  CL 109 107  CO2 20* 21*  GLUCOSE 100* 113*  BUN 59* 64*  CREATININE 5.38* 5.65*  CALCIUM 8.0* 8.0*    Studies/Results: No results found.  Assessment/Plan: Postop day #4: The patient is slowly progressing.  We will minimize sedation.  It looks like he will need rehab.  Acute blood loss anemia: His hemoglobin came up a bit.  He does not appear symptomatic.  I will add iron.  Renal insufficiency: Noted.  The hospitals are handling this.  LOS: 4 days     [name] 07/05/2021, 7:56 AM     Patient ID: Chase Evans, male   DOB: 03-21-1940, 81 y.o.   MRN: [id number]

## 2021-07-05 NOTE — Progress Notes (Signed)
  Transition of Care [facility]) Screening Note   Patient Details  Name: Chase Evans Date of Birth: 02-12-1940   Transition of Care [facility]) CM/SW Contact:    Pollie Friar, RN Phone Number: 07/05/2021, 1:43 PM   Pt is s/p LUMBAR ONE-TWO POSTERIOR LUMBAR INTERBODY FUSION WITH EXTENSION TO THORACIC TEN. He is from home alone. CIR to eval. Transition of Care Department [facility]) has reviewed patient. We will continue to monitor patient advancement through interdisciplinary progression rounds. If new patient transition needs arise, please place a TOC consult.

## 2021-07-05 NOTE — Progress Notes (Signed)
PROGRESS NOTE    Chase Evans  VPX:[id number] DOB: 11-Jul-1940 DOA: 07/01/2021 PCP: Allean Found, MD     Brief Narrative:  Chase Evans is a 81 y.o. male with past medical history of BPH; stage 5 CKD; HTN; HLD; TIA; and OSA who presented for back surgery.  He was cleared for surgery pre-operatively by his nephrologist and noted to be at risk for progression to ESRD.  This was discussed with him pre-operatively by nephrology as well as anesthesiology.  He underwent T10-L2 fusion due to spinal stenosis of the lumbar spine on 6/15.  Hospitalization was complicated by delirium.  TRH was consulted on 6/18.  New events last 24 hours / Subjective: [facility] and [facility] but not to month or year.  He is not oriented to situation, says that he is in the hospital for pneumonia.  No acute events reported overnight.  Assessment & Plan:   Principal Problem:   Spinal stenosis of lumbar region with neurogenic claudication Active Problems:   Delirium superimposed on dementia   Acute postoperative anemia due to expected blood loss   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[facility])   Hyperlipidemia   Hypertension   Goals of care, counseling/discussion   Spinal stenosis of lumbar region with neurogenic claudication -Status post bilateral L1-2 laminotomy, decompression by Dr. Arnoldo Morale 6/15 -CIR evaluation pending   Delirium superimposed on dementia -Per family, he is starting to have baseline memory issues; his father also had dementia -He also has h/o sundowning during hospitalizations -Improved, but would expect his mentation to wax and wane due to his dementia -Can use Haldol as needed for agitation  Fever -Temperature 100.8 on 8/18 -Check urinalysis, blood cultures and monitor fever curve   Acute postoperative anemia due to expected blood loss -Patient with baseline anemia associated with CKD -Transfuse for Hgb <7   Stage 5 chronic kidney disease -Baseline  creatinine around 5.4 -Remains stable at this time, continue follow-up with outpatient nephrology    Hypertension -Norvasc, lasix    Hyperlipidemia -Lipitor    Antimicrobials:  Anti-infectives (From admission, onward)    Start     Dose/Rate Route Frequency Ordered Stop   07/01/21 2100  vancomycin (VANCOCIN) IVPB 1000 mg/200 mL premix  Status:  Discontinued        1,000 mg 200 mL/hr over 60 Minutes Intravenous  Once 07/01/21 2000 07/01/21 2021   07/01/21 1045  vancomycin (VANCOCIN) IVPB 1000 mg/200 mL premix        1,000 mg 200 mL/hr over 60 Minutes Intravenous On call to O.R. 07/01/21 1036 07/01/21 1159        Objective: Vitals:   07/04/21 2339 07/05/21 0423 07/05/21 0800 07/05/21 0821  BP: (!) 160/89 (!) 156/81  107/60  Pulse: (!) 118 (!) 113  100  Resp: 20 19 17 19   Temp: 98.2 F (36.8 C) 98.6 F (37 C)  97.8 F (36.6 C)  TempSrc: Oral Oral  Oral  SpO2: 96% 98%  96%  Weight:      Height:        Intake/Output Summary (Last 24 hours) at 07/05/2021 1132 Last data filed at 07/05/2021 0824 Gross per 24 hour  Intake 993 ml  Output 1825 ml  Net -832 ml   Filed Weights   07/01/21 1038  Weight: 73.9 kg    Examination:  General exam: Appears calm and comfortable  Respiratory system: Clear to auscultation. Respiratory effort normal. No respiratory distress. No conversational dyspnea.  Cardiovascular system: S1 &  S2 heard, tachycardic, rhythm. No murmurs. No pedal edema. Gastrointestinal system: Abdomen is nondistended, soft and nontender. Normal bowel sounds heard. Central nervous system: Alert and oriented to self and place but not to year. Extremities: Symmetric in appearance  Skin: No rashes, lesions or ulcers on exposed skin   Data Reviewed: I have personally reviewed following labs and imaging studies  CBC: Recent Labs  Lab 07/02/21 0502 07/04/21 0811 07/05/21 0344  WBC 15.7* 15.0* 13.2*  HGB 9.2* 7.6* 7.7*  HCT 27.8* 24.2* 23.2*  MCV 92.1 93.8 91.7   PLT 209 239 829   Basic Metabolic Panel: Recent Labs  Lab 07/02/21 0502 07/03/21 0254 07/04/21 0811 07/05/21 0344  NA 138 136 139 139  K 4.7 4.3 4.1 4.3  CL 108 108 109 107  CO2 17* 20* 20* 21*  GLUCOSE 194* 139* 100* 113*  BUN 50* 60* 59* 64*  CREATININE 5.32* 5.38* 5.38* 5.65*  CALCIUM 8.1* 7.7* 8.0* 8.0*   GFR: Estimated Creatinine Clearance: 9.7 mL/min (A) (by C-G formula based on SCr of 5.65 mg/dL (H)). Liver Function Tests: No results for input(s): "AST", "ALT", "ALKPHOS", "BILITOT", "PROT", "ALBUMIN" in the last 168 hours. No results for input(s): "LIPASE", "AMYLASE" in the last 168 hours. No results for input(s): "AMMONIA" in the last 168 hours. Coagulation Profile: No results for input(s): "INR", "PROTIME" in the last 168 hours. Cardiac Enzymes: No results for input(s): "CKTOTAL", "CKMB", "CKMBINDEX", "TROPONINI" in the last 168 hours. BNP (last 3 results) No results for input(s): "PROBNP" in the last 8760 hours. HbA1C: No results for input(s): "HGBA1C" in the last 72 hours. CBG: No results for input(s): "GLUCAP" in the last 168 hours. Lipid Profile: No results for input(s): "CHOL", "HDL", "LDLCALC", "TRIG", "CHOLHDL", "LDLDIRECT" in the last 72 hours. Thyroid Function Tests: No results for input(s): "TSH", "T4TOTAL", "FREET4", "T3FREE", "THYROIDAB" in the last 72 hours. Anemia Panel: No results for input(s): "VITAMINB12", "FOLATE", "FERRITIN", "TIBC", "IRON", "RETICCTPCT" in the last 72 hours. Sepsis Labs: No results for input(s): "PROCALCITON", "LATICACIDVEN" in the last 168 hours.  No results found for this or any previous visit (from the past 240 hour(s)).    Radiology Studies: No results found.    Scheduled Meds:  allopurinol  100 mg Oral Daily   amLODipine  2.5 mg Oral Daily   atorvastatin  10 mg Oral q1800   docusate sodium  100 mg Oral BID   ferrous sulfate  325 mg Oral BID WC   furosemide  40 mg Oral Daily   sodium chloride flush  3 mL  Intravenous Q12H   tamsulosin  0.4 mg Oral Daily   Continuous Infusions:  sodium chloride       LOS: 4 days     Dessa Phi, DO [facility] 07/05/2021, 11:32 AM   Available via Epic secure chat 7am-7pm After these hours, please refer to coverage provider listed on [email or web address]

## 2021-07-05 NOTE — Progress Notes (Signed)
Physical Therapy Treatment Patient Details Name: Chase Evans MRN: [id number] DOB: 26-Feb-1940 Today's Date: 07/05/2021   History of Present Illness Pt is an 81 y/o male who presented for L1-2 fusion with extension to T10 in setting of spinal stenosis. PMH: BPH, CKD, HTN, TIA, hx of back sx.    PT Comments    Pt supine in bed.  He is drowsy this session and unable to progress mobility due to poor standing tolerance, posterior lean, and difficulty keeping his eyes open.  Pt continues to benefit from skilled rehab in a post acute setting.  HR elevated to 120s with standing this session.    Recommendations for follow up therapy are one component of a multi-disciplinary discharge planning process, led by the attending physician.  Recommendations may be updated based on patient status, additional functional criteria and insurance authorization.  Follow Up Recommendations  Acute inpatient rehab (3hours/day)     Assistance Recommended at Discharge Frequent or constant Supervision/Assistance  Patient can return home with the following Two people to help with walking and/or transfers;Two people to help with bathing/dressing/bathroom;Direct supervision/assist for medications management;Direct supervision/assist for financial management;Assistance with cooking/housework;Assist for transportation;Help with stairs or ramp for entrance   Equipment Recommendations  Rolling walker (2 wheels);BSC/3in1    Recommendations for Other Services Rehab consult     Precautions / Restrictions Precautions Precautions: Fall;Back Required Braces or Orthoses: Spinal Brace Spinal Brace: Thoracolumbosacral orthotic;Applied in sitting position Restrictions Weight Bearing Restrictions: No     Mobility  Bed Mobility Overal bed mobility: Needs Assistance Bed Mobility: Rolling, Sidelying to Sit Rolling: Mod assist Sidelying to sit: Mod assist     Sit to sidelying: Mod assist General bed mobility  comments: Pt with increased pain rolling but able to follow facilitation to move to bed and back to bed.  Pt required cues for technique to avoid twisting.  Pt presents with posterior lean in sitting and intermittently shutting his eyes.    Transfers Overall transfer level: Needs assistance Equipment used: Rolling walker (2 wheels) Transfers: Sit to/from Stand Sit to Stand: Max assist           General transfer comment: Heavy posterior lean despite cues with B flexed LEs.  Unable to progress to gt due to poor standing tolerance.    Ambulation/Gait                   Stairs             Wheelchair Mobility    Modified Rankin (Stroke Patients Only)       Balance Overall balance assessment: Needs assistance Sitting-balance support: No upper extremity supported, Feet supported Sitting balance-Leahy Scale: Fair     Standing balance support: During functional activity, No upper extremity supported, Bilateral upper extremity supported, Single extremity supported Standing balance-Leahy Scale: Poor Standing balance comment: pt able to stand and use hand held urinal; however, required min-mod A for stability with and without UE supports on RW                            Cognition Arousal/Alertness: Awake/alert Behavior During Therapy: WFL for tasks assessed/performed, Impulsive Overall Cognitive Status: Impaired/Different from baseline                                 General Comments: Pt very drowsy this session and pleasantly confused  Exercises      General Comments        Pertinent Vitals/Pain Pain Assessment Pain Assessment: Faces Faces Pain Scale: Hurts little more Pain Location: lower back with movement    Home Living                          Prior Function            PT Goals (current goals can now be found in the care plan section) Acute Rehab PT Goals Patient Stated Goal: did not state Potential  to Achieve Goals: Fair Progress towards PT goals: Progressing toward goals    Frequency    Min 5X/week      PT Plan Current plan remains appropriate    Co-evaluation              AM-PAC PT "6 Clicks" Mobility   Outcome Measure  Help needed turning from your back to your side while in a flat bed without using bedrails?: A Lot Help needed moving from lying on your back to sitting on the side of a flat bed without using bedrails?: A Lot Help needed moving to and from a bed to a chair (including a wheelchair)?: A Lot Help needed standing up from a chair using your arms (e.g., wheelchair or bedside chair)?: A Lot Help needed to walk in hospital room?: A Lot Help needed climbing 3-5 steps with a railing? : A Lot 6 Click Score: 12    End of Session Equipment Utilized During Treatment: Gait belt Activity Tolerance: Patient limited by pain;Patient limited by fatigue Patient left: in bed;with call bell/phone within reach;with bed alarm set;with family/visitor present;Other (comment) Nurse Communication: Mobility status PT Visit Diagnosis: Unsteadiness on feet (R26.81);Muscle weakness (generalized) (M62.81);Difficulty in walking, not elsewhere classified (R26.2);Repeated falls (R29.6);History of falling (Z91.81);Other abnormalities of gait and mobility (R26.89)     Time: 1206-1230 PT Time Calculation (min) (ACUTE ONLY): 24 min  Charges:  $Therapeutic Exercise: 8-22 mins $Therapeutic Activity: 8-22 mins                     Ashni Lonzo R. , PTA Acute Rehabilitation Services     Adiyah Lame Eli Hose 07/05/2021, 12:45 PM

## 2021-07-05 NOTE — Care Management Important Message (Signed)
Important Message  Patient Details  Name: Chase Evans MRN: [id number] Date of Birth: October 27, 1940   Medicare Important Message Given:  Yes     Memory Argue 07/05/2021, 2:43 PM

## 2021-07-05 NOTE — Consult Note (Incomplete)
Consultation Note Date: 07/05/2021   Patient Name: Chase Evans  DOB: Jun 07, 1940  MRN: [id number]  Age / Sex: 81 y.o., male  PCP: Allean Found, MD Referring Physician: Newman Pies, MD  Reason for Consultation: Establishing goals of care and Psychosocial/spiritual support  HPI/Patient Profile:   Chase Evans is a 81 y.o. male with past medical history of BPH; stage 5 CKD; HTN; HLD; TIA; and OSA who presented for back surgery.  He was cleared for surgery pre-operatively by his nephrologist and noted to be at risk for progression to ESRD.  This was discussed with him pre-operatively by nephrology as well as anesthesiology.  He underwent T10-L2 fusion due to spinal stenosis of the lumbar spine on 6/15,  (failed medical management and was worked up with a lumbar myelo CT.  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    Worsening renal function as noted/creatinine 5.65  Patient and family face treatment option decisions, advanced directive decisions and anticipatory care needs.   Clinical Assessment and Goals of Care:  This NP Wadie Lessen reviewed medical records, received report from team, assessed the patient and then meet at the patient's bedside  to discuss diagnosis, prognosis, GOC,  disposition and options.  Patient is weak however he is alert and oriented x3   Concept of Palliative Care was introduced as specialized medical care for people and their families living with serious illness.  If focuses on providing relief from the symptoms and stress of a serious illness.  The goal is to improve quality of life for both the patient and the family.  Values and goals of care important to patient and family were attempted to be elicited.    I spoke to son "Chase Evans" by phone.   Created space and opportunity for patient  and  family to explore thoughts and feelings regarding current medical situation   A  discussion was had today regarding advanced directives.  Concepts specific to code status, artifical feeding and hydration, continued IV antibiotics and rehospitalization was had.    Patient and family are open to all offered and available medical interventions to prolong life.  They are hopeful for improvement.   Questions and concerns addressed.  Patient/family encouraged to call with questions or concerns.     PMT will continue to support holistically.          Family brought AD paperwork in, hard copy in chart.  Patient's sons are named as Engineer, structural.  Patient has document stating desire for a natural death.       SUMMARY OF RECOMMENDATIONS    Code Status/Advance Care Planning: Full code Educated patient/family to consider DNR/DNI status understanding evidenced based poor outcomes in similar hospitalized patient, as the cause of arrest is likely associated with advanced chronic illness rather than an easily reversible acute cardio-pulmonary event.     Palliative Prophylaxis:  Aspiration, Bowel Regimen, Delirium Protocol, and Frequent Pain Assessment  Additional Recommendations (Limitations, Scope, Preferences): Full Scope Treatment  Patient and family verbalize desire for HD if indicated  Psycho-social/Spiritual:  Desire for further Chaplaincy support:no   Prognosis:  Unable to determine  Discharge Planning: To Be Determined      Primary Diagnoses: Present on Admission:  Spinal stenosis of lumbar region with neurogenic claudication  Hyperlipidemia  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HCC)  Hypertension  Acute postoperative anemia due to expected blood loss   I have reviewed the medical record, interviewed the patient and family, and examined the patient. The following aspects are pertinent.  Past Medical History:  Diagnosis Date   Arthritis    BPH (benign prostatic  hyperplasia)    Family history of anesthesia complication    mother N/V   Hyperlipemia    Hypertension    Sleep apnea    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HCC)    TIA (transient ischemic attack)    Social History   Socioeconomic History   Marital status: Widowed    Spouse name: Not on file   Number of children: Not on file   Years of education: Not on file   Highest education level: Not on file  Occupational History   Not on file  Tobacco Use   Smoking status: Former    Years: 4.00    Types: Cigarettes    Quit date: 01/17/1973    Years since quitting: 48.4   Smokeless tobacco: Never  Vaping Use   Vaping Use: Never used  Substance and Sexual Activity   Alcohol use: No   Drug use: No   Sexual activity: Not on file  Other Topics Concern   Not on file  Social History Narrative   Not on file   Social Determinants of Health   Financial Resource Strain: Not on file  Food Insecurity: Not on file  Transportation Needs: Not on file  Physical Activity: Not on file  Stress: Not on file  Social Connections: Not on file   Family History  Problem Relation Age of Onset   Diabetes Mother    Heart disease Mother    Heart attack Mother    Scheduled Meds:  allopurinol  100 mg Oral Daily   amLODipine  2.5 mg Oral Daily   atorvastatin  10 mg Oral q1800   docusate sodium  100 mg Oral BID   ferrous sulfate  325 mg Oral BID WC   furosemide  40 mg Oral Daily   sodium chloride flush  3 mL Intravenous Q12H   tamsulosin  0.4 mg Oral Daily   Continuous Infusions:  sodium chloride     PRN Meds:.acetaminophen **OR** acetaminophen, bisacodyl, cyclobenzaprine, HYDROmorphone, labetalol, menthol-cetylpyridinium **OR** phenol, ondansetron **OR** ondansetron (ZOFRAN) IV, sodium chloride flush Medications Prior to Admission:  Prior to Admission medications   Medication Sig Start Date End Date Taking? Authorizing Provider  allopurinol (ZYLOPRIM) 300 MG tablet Take 100 mg by mouth daily.    Yes [provider]  amLODipine (NORVASC) 5 MG tablet Take 2 tablets (10 mg total) by mouth daily. Patient taking differently: Take 2.5 mg by mouth daily. 01/23/17  Yes Revankar, Reita Cliche, MD  Apoaequorin (PREVAGEN PO) Take 1 capsule by mouth daily.   Yes [provider]  Cyanocobalamin (B-12) 2500 MCG TABS Take 2,500 mcg by mouth daily.   Yes [provider]  furosemide (LASIX) 40 MG tablet Take 40 mg by mouth daily.   Yes [provider]  gabapentin (NEURONTIN) 600 MG tablet Take 100 mg by mouth 3 (three) times daily.  Yes [provider]  Omega-3 Fatty Acids (OMEGA 3 PO) Take 1-2 capsules by mouth See admin instructions. Take 2 capsules by mouth in the morning and 1 capsule in the evening   Yes [provider]  OVER THE COUNTER MEDICATION Take 1 capsule by mouth daily. N Balance 8   Yes [provider]  OVER THE COUNTER MEDICATION Take 2 capsules by mouth daily. B Flexible   Yes [provider]  tamsulosin (FLOMAX) 0.4 MG CAPS capsule Take 0.4 mg by mouth daily. 06/08/21  Yes [provider]  TURMERIC CURCUMIN PO Take 1 capsule by mouth daily.   Yes [provider]  atorvastatin (LIPITOR) 10 MG tablet Take 1 tablet (10 mg total) by mouth daily at 6 PM. Patient not taking: Reported on 06/18/2021 01/19/17 04/19/17  Revankar, Reita Cliche, MD  clopidogrel (PLAVIX) 75 MG tablet Take 75 mg by mouth daily.    [provider]   Allergies  Allergen Reactions   Nsaids Other (See Comments)    Chronic kidney disease   Penicillins Hives   Percocet [Oxycodone-Acetaminophen] Other (See Comments)    hallucinations   Crestor [Rosuvastatin] Other (See Comments)    Muscle pain   Review of Systems  Neurological:  Positive for weakness.    Physical Exam Cardiovascular:     Rate and Rhythm: Normal rate.  Pulmonary:     Effort: Pulmonary effort is normal.  Skin:    General: Skin is warm and dry.  Neurological:      Mental Status: He is alert.     Vital Signs: BP 107/60   Pulse 100   Temp 97.8 F (36.6 C) (Oral)   Resp 19   Ht 5\' 7"  (1.702 m)   Wt 73.9 kg   SpO2 96%   BMI 25.53 kg/m  Pain Scale: 0-10   Pain Score: Asleep   SpO2: SpO2: 96 % O2 Device:SpO2: 96 % O2 Flow Rate: .O2 Flow Rate (L/min): 3 L/min  IO: Intake/output summary:  Intake/Output Summary (Last 24 hours) at 07/05/2021 0946 Last data filed at 07/05/2021 0824 Gross per 24 hour  Intake 993 ml  Output 1825 ml  Net -832 ml    LBM: Last BM Date : 06/30/21 Baseline Weight: Weight: 73.9 kg Most recent weight: Weight: 73.9 kg     Palliative Assessment/Data: 40 % currently      Signed by: Wadie Lessen, NP   Please contact Palliative Medicine Team phone at [phone] for questions and concerns.  For individual provider: See Shea Evans

## 2021-07-06 DIAGNOSIS — N185 Chronic kidney disease, stage 5: Secondary | ICD-10-CM | POA: Diagnosis not present

## 2021-07-06 DIAGNOSIS — Z7189 Other specified counseling: Secondary | ICD-10-CM | POA: Diagnosis not present

## 2021-07-06 DIAGNOSIS — M48062 Spinal stenosis, lumbar region with neurogenic claudication: Secondary | ICD-10-CM | POA: Diagnosis not present

## 2021-07-06 DIAGNOSIS — Z515 Encounter for palliative care: Secondary | ICD-10-CM | POA: Diagnosis not present

## 2021-07-06 LAB — CBC
HCT: 22.6 % — ABNORMAL LOW (ref 39.0–52.0)
Hemoglobin: 7.2 g/dL — ABNORMAL LOW (ref 13.0–17.0)
MCH: 29.9 pg (ref 26.0–34.0)
MCHC: 31.9 g/dL (ref 30.0–36.0)
MCV: 93.8 fL (ref 80.0–100.0)
Platelets: 243 10*3/uL (ref 150–400)
RBC: 2.41 MIL/uL — ABNORMAL LOW (ref 4.22–5.81)
RDW: 14.2 % (ref 11.5–15.5)
WBC: 11.9 10*3/uL — ABNORMAL HIGH (ref 4.0–10.5)
nRBC: 0 % (ref 0.0–0.2)

## 2021-07-06 LAB — URINALYSIS, ROUTINE W REFLEX MICROSCOPIC
Bacteria, UA: NONE SEEN
Bilirubin Urine: NEGATIVE
Glucose, UA: NEGATIVE mg/dL
Ketones, ur: NEGATIVE mg/dL
Leukocytes,Ua: NEGATIVE
Nitrite: NEGATIVE
Protein, ur: 100 mg/dL — AB
Specific Gravity, Urine: 1.009 (ref 1.005–1.030)
pH: 5 (ref 5.0–8.0)

## 2021-07-06 LAB — BASIC METABOLIC PANEL
Anion gap: 12 (ref 5–15)
BUN: 73 mg/dL — ABNORMAL HIGH (ref 8–23)
CO2: 20 mmol/L — ABNORMAL LOW (ref 22–32)
Calcium: 8.2 mg/dL — ABNORMAL LOW (ref 8.9–10.3)
Chloride: 109 mmol/L (ref 98–111)
Creatinine, Ser: 6.08 mg/dL — ABNORMAL HIGH (ref 0.61–1.24)
GFR, Estimated: 9 mL/min — ABNORMAL LOW (ref >60)
Glucose, Bld: 109 mg/dL — ABNORMAL HIGH (ref 70–99)
Potassium: 4.5 mmol/L (ref 3.5–5.1)
Sodium: 141 mmol/L (ref 135–145)

## 2021-07-06 NOTE — Progress Notes (Signed)
Occupational Therapy Treatment Patient Details Name: Chase Evans MRN: [id number] DOB: 20-Oct-1940 Today's Date: 07/06/2021   History of present illness Pt is an 81 y/o male who presented for L1-2 fusion with extension to T10 in setting of spinal stenosis. PMH: BPH, CKD, HTN, TIA, hx of back sx.   OT comments  Pt making gradual progress towards goals though continues to require hands on assist during all standing tasks due to balance deficits and weakness. With progressive activity, pt requires increased assist (up to Mod-Max A) to maintain standing balance at sink during ADLs and with hallway mobility. Pt unable to recall back precautions or how to manage TLSO brace today. Despite pain and fatigue, pt eager to attempt all tasks during session.Son present and supportive, hopeful for pt to regain independence with postacute rehab.    Recommendations for follow up therapy are one component of a multi-disciplinary discharge planning process, led by the attending physician.  Recommendations may be updated based on patient status, additional functional criteria and insurance authorization.    Follow Up Recommendations  Acute inpatient rehab (3hours/day)    Assistance Recommended at Discharge Frequent or constant Supervision/Assistance  Patient can return home with the following  Two people to help with walking and/or transfers;Two people to help with bathing/dressing/bathroom;Assistance with cooking/housework;Direct supervision/assist for medications management;Direct supervision/assist for financial management;Assist for transportation;Help with stairs or ramp for entrance   Equipment Recommendations  BSC/3in1;Other (comment) (RW)    Recommendations for Other Services Rehab consult    Precautions / Restrictions Precautions Precautions: Fall;Back Precaution Booklet Issued: Yes (comment) Precaution Comments: reviewed back precautions with pt and family members; pt reports 30 falls in  past 6 months Required Braces or Orthoses: Spinal Brace Spinal Brace: Thoracolumbosacral orthotic;Applied in sitting position Restrictions Weight Bearing Restrictions: No       Mobility Bed Mobility Overal bed mobility: Needs Assistance Bed Mobility: Rolling, Sidelying to Sit Rolling: Min assist Sidelying to sit: Mod assist       General bed mobility comments: tactile cues to initiate log rolling with light assist to roll to side. Mod A with multimodal cues to get BLE off of bed and lift trunk    Transfers Overall transfer level: Needs assistance Equipment used: Rolling walker (2 wheels) Transfers: Sit to/from Stand Sit to Stand: Min assist, Mod assist           General transfer comment: Initially Mod A to stand from bed with posterior bias and noted toes not fully on floor. Progressed to Min A to stand from recliner, pushing on armrests to stand at sink     Balance Overall balance assessment: Needs assistance Sitting-balance support: No upper extremity supported, Feet supported Sitting balance-Leahy Scale: Fair Sitting balance - Comments: posterior bias with dynamic tasks noted Postural control: Posterior lean Standing balance support: During functional activity, No upper extremity supported, Bilateral upper extremity supported, Single extremity supported Standing balance-Leahy Scale: Poor                             ADL either performed or assessed with clinical judgement   ADL Overall ADL's : Needs assistance/impaired     Grooming: Moderate assistance;Standing;Oral care;Wash/dry face Grooming Details (indicate cue type and reason): initially standing with Min A, progressing to Mod/Max A for standing balance, tactile cues for correcting posture and knee extension.         Upper Body Dressing : Moderate assistance;Sitting Upper Body Dressing Details (indicate cue type  and reason): Max A for TLSO brace mgmt though feel pt able to manage shirts, etc  well Lower Body Dressing: Moderate assistance;Sit to/from stand Lower Body Dressing Details (indicate cue type and reason): reinforced figure four position with pt able to return demo with Min A             Functional mobility during ADLs: Moderate assistance;Maximal assistance;+2 for physical assistance;+2 for safety/equipment;Rolling walker (2 wheels);Cueing for sequencing;Cueing for safety      Extremity/Trunk Assessment Upper Extremity Assessment Upper Extremity Assessment: Generalized weakness   Lower Extremity Assessment Lower Extremity Assessment: Defer to PT evaluation        Vision   Vision Assessment?: No apparent visual deficits   Perception     Praxis      Cognition Arousal/Alertness: Awake/alert Behavior During Therapy: WFL for tasks assessed/performed, Impulsive Overall Cognitive Status: Impaired/Different from baseline Area of Impairment: Attention, Memory, Following commands, Safety/judgement, Awareness, Problem solving, Orientation                 Orientation Level: Disoriented to, Situation Current Attention Level: Sustained Memory: Decreased short-term memory Following Commands: Follows one step commands with increased time, Follows one step commands consistently Safety/Judgement: Decreased awareness of safety, Decreased awareness of deficits Awareness: Emergent Problem Solving: Difficulty sequencing, Requires verbal cues, Requires tactile cues, Decreased initiation, Slow processing General Comments: pleasant, follows directions consistently today. confusion still noted with pt unable to recall fall overnight (per nursing)        Exercises      Shoulder Instructions       General Comments Son entering during session, supportive and assisting throughout    Pertinent Vitals/ Pain       Pain Assessment Pain Assessment: Faces Faces Pain Scale: Hurts even more Pain Location: lower back with movement Pain Descriptors / Indicators:  Grimacing, Guarding Pain Intervention(s): Monitored during session, Limited activity within patient's tolerance  Home Living                                          Prior Functioning/Environment              Frequency  Min 2X/week        Progress Toward Goals  OT Goals(current goals can now be found in the care plan section)  Progress towards OT goals: Progressing toward goals  Acute Rehab OT Goals Patient Stated Goal: agreeable to progress mobility, reduce fall risk OT Goal Formulation: With patient/family Time For Goal Achievement: 07/16/21 Potential to Achieve Goals: Good ADL Goals Pt Will Perform Lower Body Dressing: with min guard assist;sit to/from stand;sitting/lateral leans Pt Will Transfer to Toilet: with min guard assist;ambulating Additional ADL Goal #1: Pt to demo ability to manage TLSO brace Independently to maximize adherence to back precautions Additional ADL Goal #2: Pt to verbalize at least 3 fall prevention strategies to implement in home environment.  Plan Discharge plan remains appropriate    Co-evaluation    PT/OT/SLP Co-Evaluation/Treatment: Yes Reason for Co-Treatment: For patient/therapist safety;To address functional/ADL transfers;Other (comment) (progress gait attempts)   OT goals addressed during session: ADL's and self-care;Strengthening/ROM      AM-PAC OT "6 Clicks" Daily Activity     Outcome Measure   Help from another person eating meals?: None Help from another person taking care of personal grooming?: A Lot Help from another person toileting, which includes using toliet, bedpan, or urinal?:  A Lot Help from another person bathing (including washing, rinsing, drying)?: A Lot Help from another person to put on and taking off regular upper body clothing?: A Lot Help from another person to put on and taking off regular lower body clothing?: A Lot 6 Click Score: 14    End of Session Equipment Utilized During  Treatment: Gait belt;Rolling walker (2 wheels);Back brace  OT Visit Diagnosis: Unsteadiness on feet (R26.81);Other abnormalities of gait and mobility (R26.89);Muscle weakness (generalized) (M62.81);History of falling (Z91.81);Repeated falls (R29.6)   Activity Tolerance Patient tolerated treatment well   Patient Left in chair;with call bell/phone within reach;with chair alarm set;with family/visitor present   Nurse Communication Mobility status        Time: 0725-0800 OT Time Calculation (min): 35 min  Charges: OT General Charges $OT Visit: 1 Visit OT Treatments $Self Care/Home Management : 8-22 mins  Malachy Chamber, OTR/L Acute Rehab Services Office: [phone]   Layla Maw 07/06/2021, 8:19 AM

## 2021-07-06 NOTE — Progress Notes (Signed)
Subjective: The patient is alert and pleasant.  He is in no apparent distress.  He denies pain.  Objective: Vital signs in last 24 hours: Temp:  [97.8 F (36.6 C)-99 F (37.2 C)] 98.3 F (36.8 C) (06/20 0722) Pulse Rate:  [100-117] 111 (06/20 0722) Resp:  [16-20] 18 (06/20 0722) BP: (107-152)/(60-84) 148/78 (06/20 0722) SpO2:  [96 %-97 %] 97 % (06/20 0548) Estimated body mass index is 25.53 kg/m as calculated from the following:   Height as of this encounter: 5\' 7"  (1.702 m).   Weight as of this encounter: 73.9 kg.   Intake/Output from previous day: 06/19 0701 - 06/20 0700 In: 150 [P.O.:150] Out: 1822 [Urine:1822] Intake/Output this shift: No intake/output data recorded.  Physical exam the patient is alert and pleasant.  His lower extremity strength is grossly normal.  Lab Results: Recent Labs    07/05/21 0344 07/06/21 0126  WBC 13.2* 11.9*  HGB 7.7* 7.2*  HCT 23.2* 22.6*  PLT 220 243   BMET Recent Labs    07/05/21 0344 07/06/21 0126  NA 139 141  K 4.3 4.5  CL 107 109  CO2 21* 20*  GLUCOSE 113* 109*  BUN 64* 73*  CREATININE 5.65* 6.08*  CALCIUM 8.0* 8.2*    Studies/Results: No results found.  Assessment/Plan: Postop day #5: He is mildly confused but I suspect this will resolve with time.  Acute blood loss anemia: He does not appear particularly symptomatic however he may need packed red blood cells.  I will wait for Dr. Velia Meyer, et al, to weigh in  Renal insufficiency: Creatinine has increased.  I will ask renal to see him.  LOS: 5 days     Chase Evans 07/06/2021, 7:41 AM     Patient ID: Chase Evans, male   DOB: 03/07/1940, 81 y.o.   MRN: [id number]

## 2021-07-06 NOTE — Progress Notes (Signed)
Physical Therapy Treatment Patient Details Name: Chase Evans MRN: [id number] DOB: [birth year] Today's Date: 07/06/2021   History of Present Illness Pt is an 81 y/o male who presented for L1-2 fusion with extension to T10 in setting of spinal stenosis. PMH: BPH, CKD, HTN, TIA, hx of back sx.    PT Comments    Pt demonstrating improved cognition and function. Worked in conjunction with OT today to progress functional ambulation and standing tasks at sink. Pt modA for bed mobility and transfer into standing. Pt able to amb with mod/maxAx2 for 25' with RW. Pt slowly progressing towards all goals. Pt remains appropriate for AIR upon d/c for maximal functional recovery as pt was indep and living alone PTA. Acute PT to cont to follow.    Recommendations for follow up therapy are one component of a multi-disciplinary discharge planning process, led by the attending physician.  Recommendations may be updated based on patient status, additional functional criteria and insurance authorization.  Follow Up Recommendations  Acute inpatient rehab (3hours/day)     Assistance Recommended at Discharge Frequent or constant Supervision/Assistance  Patient can return home with the following Two people to help with walking and/or transfers;Two people to help with bathing/dressing/bathroom;Direct supervision/assist for medications management;Direct supervision/assist for financial management;Assistance with cooking/housework;Assist for transportation;Help with stairs or ramp for entrance   Equipment Recommendations  Rolling walker (2 wheels);BSC/3in1    Recommendations for Other Services Rehab consult     Precautions / Restrictions Precautions Precautions: Fall;Back Precaution Booklet Issued: Yes (comment) Precaution Comments: pt with no recall, pt re-educated on back precautions but again no recall at end of session Required Braces or Orthoses: Spinal Brace Spinal Brace: Thoracolumbosacral  orthotic;Applied in sitting position (pt dependent for donning brace) Restrictions Weight Bearing Restrictions: No     Mobility  Bed Mobility Overal bed mobility: Needs Assistance Bed Mobility: Rolling, Sidelying to Sit Rolling: Min assist Sidelying to sit: Mod assist       General bed mobility comments: tactile cues to initiate log rolling with light assist to roll to side. Mod A with multimodal cues to get BLE off of bed and lift trunk    Transfers Overall transfer level: Needs assistance Equipment used: Rolling walker (2 wheels) Transfers: Sit to/from Stand Sit to Stand: Mod assist, +2 physical assistance           General transfer comment: Initially Mod A to stand from bed with posterior bias and noted toes not fully on floor. Progressed to Min A to stand from recliner, pushing on armrests to stand at sink    Ambulation/Gait Ambulation/Gait assistance: Max assist, +2 physical assistance, +2 safety/equipment Gait Distance (Feet): 25 Feet Assistive device: Rolling walker (2 wheels) Gait Pattern/deviations: Step-to pattern, Decreased step length - right, Decreased step length - left, Decreased stride length, Decreased weight shift to right, Decreased weight shift to left, Knee flexed in stance - left, Knee flexed in stance - right, Trunk flexed, Narrow base of support Gait velocity: decreased Gait velocity interpretation: <1.8 ft/sec, indicate of risk for recurrent falls   General Gait Details: pt with bilat knees flexed, maxA to push RW walker, to maintain upright position and to off weight trunk to allow for pt to take steps. 1 standing rest break at 10 feet, HR 131bpm   Stairs             Wheelchair Mobility    Modified Rankin (Stroke Patients Only)       Balance Overall balance assessment: Needs assistance Sitting-balance  support: No upper extremity supported, Feet supported Sitting balance-Leahy Scale: Fair Sitting balance - Comments: posterior bias ,  verbal cues to bring trunk forward Postural control: Posterior lean Standing balance support: During functional activity, No upper extremity supported, Bilateral upper extremity supported, Single extremity supported Standing balance-Leahy Scale: Poor Standing balance comment: pt stood at sink >3 min to brush teeth, initially required min/modA with support of L UE but then progressed to maxA due to onset of fatigue, pt unable to maintain bilat knee extension                            Cognition Arousal/Alertness: Awake/alert Behavior During Therapy: WFL for tasks assessed/performed, Impulsive Overall Cognitive Status: Impaired/Different from baseline Area of Impairment: Attention, Memory, Following commands, Safety/judgement, Awareness, Problem solving, Orientation                 Orientation Level: Disoriented to, Time, Situation Current Attention Level: Sustained Memory: Decreased short-term memory (pt didn't recall falling in hospital yesterday) Following Commands: Follows one step commands with increased time Safety/Judgement: Decreased awareness of safety, Decreased awareness of deficits Awareness: Emergent Problem Solving: Difficulty sequencing, Requires verbal cues, Requires tactile cues, Decreased initiation, Slow processing General Comments: pleasant, follows directions consistently today. confusion still noted with pt unable to recall fall overnight , pt with delayed response time but also very HOH, no recall of precautions despite max cues        Exercises      General Comments General comments (skin integrity, edema, etc.): Resting HR 109bpm, inc to 131bpm during amb      Pertinent Vitals/Pain Pain Assessment Pain Assessment: 0-10 Pain Score: 2  Pain Location: lower back with movement, icnreased to 8/10, at rest in chair pt 2/10 Pain Descriptors / Indicators: Grimacing, Guarding Pain Intervention(s): Monitored during session    Home Living                           Prior Function            PT Goals (current goals can now be found in the care plan section) Acute Rehab PT Goals PT Goal Formulation: With patient/family Time For Goal Achievement: 07/16/21 Potential to Achieve Goals: Fair Progress towards PT goals: Progressing toward goals    Frequency    Min 5X/week      PT Plan Current plan remains appropriate    Co-evaluation PT/OT/SLP Co-Evaluation/Treatment: Yes Reason for Co-Treatment: For patient/therapist safety PT goals addressed during session: Mobility/safety with mobility OT goals addressed during session: ADL's and self-care;Strengthening/ROM      AM-PAC PT "6 Clicks" Mobility   Outcome Measure  Help needed turning from your back to your side while in a flat bed without using bedrails?: A Lot Help needed moving from lying on your back to sitting on the side of a flat bed without using bedrails?: A Lot Help needed moving to and from a bed to a chair (including a wheelchair)?: A Lot Help needed standing up from a chair using your arms (e.g., wheelchair or bedside chair)?: A Lot Help needed to walk in hospital room?: A Lot Help needed climbing 3-5 steps with a railing? : A Lot 6 Click Score: 12    End of Session Equipment Utilized During Treatment: Gait belt Activity Tolerance: Patient limited by pain;Patient limited by fatigue Patient left: with call bell/phone within reach;with family/visitor present;in chair;with chair alarm set Nurse Communication:  Mobility status PT Visit Diagnosis: Unsteadiness on feet (R26.81);Muscle weakness (generalized) (M62.81);Difficulty in walking, not elsewhere classified (R26.2);Repeated falls (R29.6);History of falling (Z91.81);Other abnormalities of gait and mobility (R26.89)     Time: 0728-0807 PT Time Calculation (min) (ACUTE ONLY): 39 min  Charges:  $Gait Training: 8-22 mins $Therapeutic Activity: 8-22 mins                     Kittie Plater, PT,  DPT Acute Rehabilitation Services Secure chat preferred Office #: [phone]    Berline Lopes 07/06/2021, 10:43 AM

## 2021-07-06 NOTE — Progress Notes (Signed)
TRH night cross cover note:   I was notified by RN that the patient has not voided over the course of this night shift, with ensuing bladder scan revealing 900 6 cc.  I subsequently placed order for straight cath x1 followed by nursing communication order for every 6 hours postvoid residual scans with prn straight cath for PVR greater than 400 cc.     Babs Bertin, DO Hospitalist

## 2021-07-06 NOTE — Progress Notes (Signed)
Patient ID: [name], male   DOB: 1941/01/01, 81 y.o.   MRN: [id number]    Progress Note from the Palliative Medicine Team at [facility]   Patient Name: Chase Evans        Date: 07/06/2021 DOB: 24-Feb-1940  Age: 81 y.o. MRN#: [id number] Attending Physician: Newman Pies, MD Primary Care Physician: Allean Found, MD Admit Date: 07/01/2021   Medical records reviewed   Chase Evans is a 81 y.o. male with past medical history of BPH; stage 5 CKD; HTN; HLD; TIA; and OSA who presented for back surgery.  He was cleared for surgery pre-operatively by his nephrologist and noted to be at risk for progression to ESRD.  This was discussed with him pre-operatively by nephrology as well as anesthesiology.  He underwent T10-L2 fusion due to spinal stenosis of the lumbar spine on 6/15,  (failed medical management and was worked up with a lumbar myelo CT.  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     Worsening renal function as noted/creatinine 6.08 today    Patient and family face treatment option decisions, advanced directive decisions and anticipatory care needs.  This NP visited patient at the bedside as a follow up to  yesterday's [place], patient's son Chase Evans at bedside.  Patient is alert and oriented x 3, patient working with therapies and progressing towards goals.  Continued conversation regarding current medical situation.  Patient and family verbalize openness to all offered and available medical interventions to prolong life, including dialysis if indicated.  Hope is for transition to skilled nursing facility for short-term rehab           Education offered today regarding  the importance of continued conversation with family and their  medical providers regarding overall plan of care and treatment options,  ensuring decisions are within the context of the patients values and  GOCs.  Questions and concerns addressed   Discussed with Dr   Wadie Lessen NP  Palliative Medicine Team Team Phone # [phone] Pager [phone]

## 2021-07-06 NOTE — Progress Notes (Signed)
PROGRESS NOTE    Chase Evans  DJM:[id number] DOB: 1941/01/16 DOA: 07/01/2021 PCP: Allean Found, MD     Brief Narrative:  Chase Evans is a 81 y.o. male with past medical history of BPH; stage 5 CKD; HTN; HLD; TIA; and OSA who presented for back surgery.  He was cleared for surgery pre-operatively by his nephrologist and noted to be at risk for progression to ESRD.  This was discussed with him pre-operatively by nephrology as well as anesthesiology.  He underwent T10-L2 fusion due to spinal stenosis of the lumbar spine on 6/15.  Hospitalization was complicated by delirium.  TRH was consulted on 6/18.  New events last 24 hours / Subjective: Patient is getting back into bed from being in chair.  Admits to pain.  His son is at bedside.  Assessment & Plan:   Principal Problem:   Spinal stenosis of lumbar region with neurogenic claudication Active Problems:   Delirium superimposed on dementia   Acute postoperative anemia due to expected blood loss   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HCC)   Hyperlipidemia   Hypertension   Goals of care, counseling/discussion   Spinal stenosis of lumbar region with neurogenic claudication -Status post bilateral L1-2 laminotomy, decompression by Dr. Arnoldo Morale 6/15 -CIR evaluation pending   Delirium superimposed on dementia -Per family, he is starting to have baseline memory issues; his father also had dementia -He also has h/o sundowning during hospitalizations -Improved, but would expect his mentation to wax and wane due to his dementia -Can use Haldol as needed for agitation  Fever -Temperature 100.8 on 8/18 -UA negative for UTI -Blood cultures pending -Afebrile yesterday   Acute postoperative anemia due to expected blood loss -Patient with baseline anemia associated with CKD -Transfuse for Hgb <7.  Would hold off on transfusion today.  Repeat blood work in the morning   AKI on stage 5 chronic kidney disease -Baseline  creatinine around 5.4 -Creatinine bumped up in setting of urinary retention yesterday requiring straight cath -Continue to monitor bladder scan as needed and repeat blood work in the morning -Hold Lasix    Hypertension -Norvasc   Hyperlipidemia -Lipitor    Antimicrobials:  Anti-infectives (From admission, onward)    Start     Dose/Rate Route Frequency Ordered Stop   07/01/21 2100  vancomycin (VANCOCIN) IVPB 1000 mg/200 mL premix  Status:  Discontinued        1,000 mg 200 mL/hr over 60 Minutes Intravenous  Once 07/01/21 2000 07/01/21 2021   07/01/21 1045  vancomycin (VANCOCIN) IVPB 1000 mg/200 mL premix        1,000 mg 200 mL/hr over 60 Minutes Intravenous On call to O.R. 07/01/21 1036 07/01/21 1159        Objective: Vitals:   07/06/21 0142 07/06/21 0344 07/06/21 0548 07/06/21 0722  BP: 132/75 (!) 152/69 140/84 (!) 148/78  Pulse: (!) 110 (!) 103 (!) 108 (!) 111  Resp: 16 16 16 18   Temp: 98.9 F (37.2 C) 98.9 F (37.2 C) 98.3 F (36.8 C) 98.3 F (36.8 C)  TempSrc:  Oral Oral Oral  SpO2: 97% 97% 97%   Weight:      Height:        Intake/Output Summary (Last 24 hours) at 07/06/2021 1043 Last data filed at 07/06/2021 0600 Gross per 24 hour  Intake --  Output 1822 ml  Net -1822 ml    Filed Weights   07/01/21 1038  Weight: 73.9 kg    Examination:  General exam:  Appears calm and comfortable  Respiratory system: Clear to auscultation. Respiratory effort normal. No respiratory distress. No conversational dyspnea.  Cardiovascular system: S1 & S2 heard, tachycardic, rhythm. No murmurs. No pedal edema. Gastrointestinal system: Abdomen is nondistended, soft and nontender. Normal bowel sounds heard. Central nervous system: Alert and oriented Extremities: Symmetric in appearance  Skin: No rashes, lesions or ulcers on exposed skin   Data Reviewed: I have personally reviewed following labs and imaging studies  CBC: Recent Labs  Lab 07/02/21 0502 07/04/21 0811  07/05/21 0344 07/06/21 0126  WBC 15.7* 15.0* 13.2* 11.9*  HGB 9.2* 7.6* 7.7* 7.2*  HCT 27.8* 24.2* 23.2* 22.6*  MCV 92.1 93.8 91.7 93.8  PLT 209 239 220 417    Basic Metabolic Panel: Recent Labs  Lab 07/02/21 0502 07/03/21 0254 07/04/21 0811 07/05/21 0344 07/06/21 0126  NA 138 136 139 139 141  K 4.7 4.3 4.1 4.3 4.5  CL 108 108 109 107 109  CO2 17* 20* 20* 21* 20*  GLUCOSE 194* 139* 100* 113* 109*  BUN 50* 60* 59* 64* 73*  CREATININE 5.32* 5.38* 5.38* 5.65* 6.08*  CALCIUM 8.1* 7.7* 8.0* 8.0* 8.2*    GFR: Estimated Creatinine Clearance: 9.1 mL/min (A) (by C-G formula based on SCr of 6.08 mg/dL (H)). Liver Function Tests: No results for input(s): "AST", "ALT", "ALKPHOS", "BILITOT", "PROT", "ALBUMIN" in the last 168 hours. No results for input(s): "LIPASE", "AMYLASE" in the last 168 hours. No results for input(s): "AMMONIA" in the last 168 hours. Coagulation Profile: No results for input(s): "INR", "PROTIME" in the last 168 hours. Cardiac Enzymes: No results for input(s): "CKTOTAL", "CKMB", "CKMBINDEX", "TROPONINI" in the last 168 hours. BNP (last 3 results) No results for input(s): "PROBNP" in the last 8760 hours. HbA1C: No results for input(s): "HGBA1C" in the last 72 hours. CBG: No results for input(s): "GLUCAP" in the last 168 hours. Lipid Profile: No results for input(s): "CHOL", "HDL", "LDLCALC", "TRIG", "CHOLHDL", "LDLDIRECT" in the last 72 hours. Thyroid Function Tests: No results for input(s): "TSH", "T4TOTAL", "FREET4", "T3FREE", "THYROIDAB" in the last 72 hours. Anemia Panel: No results for input(s): "VITAMINB12", "FOLATE", "FERRITIN", "TIBC", "IRON", "RETICCTPCT" in the last 72 hours. Sepsis Labs: No results for input(s): "PROCALCITON", "LATICACIDVEN" in the last 168 hours.  Recent Results (from the past 240 hour(s))  Culture, blood (Routine X 2) w Reflex to ID Panel     Status: None (Preliminary result)   Collection Time: 07/05/21  8:00 AM   Specimen:  BLOOD LEFT HAND  Result Value Ref Range Status   Specimen Description BLOOD LEFT HAND  Final   Special Requests   Final    BOTTLES DRAWN AEROBIC AND ANAEROBIC Blood Culture adequate volume   Culture   Final    NO GROWTH < 24 HOURS Performed at [facility], [address], [place], [place] [address]    Report Status PENDING  Incomplete  Culture, blood (Routine X 2) w Reflex to ID Panel     Status: None (Preliminary result)   Collection Time: 07/05/21  9:05 AM   Specimen: BLOOD  Result Value Ref Range Status   Specimen Description BLOOD LEFT ANTECUBITAL  Final   Special Requests   Final    BOTTLES DRAWN AEROBIC AND ANAEROBIC Blood Culture adequate volume   Culture   Final    NO GROWTH < 24 HOURS Performed at [facility], [place] [address], [place], [place] [address]    Report Status PENDING  Incomplete  Radiology Studies: No results found.    Scheduled Meds:  allopurinol  100 mg Oral Daily   amLODipine  2.5 mg Oral Daily   atorvastatin  10 mg Oral q1800   docusate sodium  100 mg Oral BID   ferrous sulfate  325 mg Oral BID WC   furosemide  40 mg Oral Daily   sodium chloride flush  3 mL Intravenous Q12H   tamsulosin  0.4 mg Oral Daily   Continuous Infusions:  sodium chloride       LOS: 5 days     Dessa Phi, DO [facility] 07/06/2021, 10:43 AM   Available via Epic secure chat 7am-7pm After these hours, please refer to coverage provider listed on [email or web address]

## 2021-07-06 NOTE — Progress Notes (Signed)
   07/05/21 2132  Assess: MEWS Score  Temp 99 F (37.2 C)  BP (!) 142/80  MAP (mmHg) 97  Pulse Rate (!) 111  Resp 16  SpO2 96 %  O2 Device Room Air  Patient Activity (if Appropriate) In bed  Assess: MEWS Score  MEWS Temp 0  MEWS Systolic 0  MEWS Pulse 2  MEWS RR 0  MEWS LOC 0  MEWS Score 2  MEWS Score Color Yellow  Assess: if the MEWS score is Yellow or Red  Were vital signs taken at a resting state? Yes  Focused Assessment No change from prior assessment  Does the patient meet 2 or more of the SIRS criteria? No  Does the patient have a confirmed or suspected source of infection? No  MEWS guidelines implemented *See Row Information* Yes  Treat  MEWS Interventions Administered scheduled meds/treatments  Pain Scale 0-10  Pain Score 0  Faces Pain Scale 0  Take Vital Signs  Increase Vital Sign Frequency  Yellow: Q 2hr X 2 then Q 4hr X 2, if remains yellow, continue Q 4hrs  Escalate  MEWS: Escalate Yellow: discuss with charge nurse/RN and consider discussing with provider and RRT  Notify: Charge Nurse/RN  Name of Charge Nurse/RN Notified phil  Date Charge Nurse/RN Notified 07/05/21  Time Charge Nurse/RN Notified 2200  Document  Patient Outcome Stabilized after interventions  Progress note created (see row info) Yes  Assess: SIRS CRITERIA  SIRS Temperature  0  SIRS Pulse 1  SIRS Respirations  0  SIRS WBC 0  SIRS Score Sum  1

## 2021-07-06 NOTE — Progress Notes (Signed)
Inpatient Rehab Admissions Coordinator:   Pt. Is not yet medically ready for CIR. Will continue to follow for potential admit pending medical readiness and bed availability.  Clemens Catholic, [place], [place] Admissions Coordinator  [phone] ([place]) [phone] (office)

## 2021-07-06 NOTE — Plan of Care (Signed)
Patient had 2 episodes of urinary retention, straight cath in and out done and tolerated well. Continued to monitor.

## 2021-07-06 NOTE — Progress Notes (Signed)
Patient suffered unwitnessed fall in his room. Patient is free of injury and states that he is not hurting anywhere. Patient states that he was getting up to urinate. Patient has condom cath in place and was working properly at the time of the fall. Patient is impulsive, bed alarm in place at the time of the fall. Bed alarm sounded patient found on the floor next to the wall. Patient placed back in bed and examined for injury. No injury found.

## 2021-07-07 ENCOUNTER — Inpatient Hospital Stay (HOSPITAL_COMMUNITY): Payer: Medicare Other

## 2021-07-07 DIAGNOSIS — Z515 Encounter for palliative care: Secondary | ICD-10-CM | POA: Diagnosis not present

## 2021-07-07 DIAGNOSIS — M48062 Spinal stenosis, lumbar region with neurogenic claudication: Secondary | ICD-10-CM | POA: Diagnosis not present

## 2021-07-07 DIAGNOSIS — N185 Chronic kidney disease, stage 5: Secondary | ICD-10-CM | POA: Diagnosis not present

## 2021-07-07 DIAGNOSIS — R531 Weakness: Secondary | ICD-10-CM

## 2021-07-07 LAB — BASIC METABOLIC PANEL WITH GFR
Anion gap: 16 — ABNORMAL HIGH (ref 5–15)
BUN: 76 mg/dL — ABNORMAL HIGH (ref 8–23)
CO2: 20 mmol/L — ABNORMAL LOW (ref 22–32)
Calcium: 8.3 mg/dL — ABNORMAL LOW (ref 8.9–10.3)
Chloride: 103 mmol/L (ref 98–111)
Creatinine, Ser: 5.84 mg/dL — ABNORMAL HIGH (ref 0.61–1.24)
GFR, Estimated: 9 mL/min — ABNORMAL LOW
Glucose, Bld: 114 mg/dL — ABNORMAL HIGH (ref 70–99)
Potassium: 3.5 mmol/L (ref 3.5–5.1)
Sodium: 139 mmol/L (ref 135–145)

## 2021-07-07 LAB — CBC
HCT: 23.3 % — ABNORMAL LOW (ref 39.0–52.0)
Hemoglobin: 7.7 g/dL — ABNORMAL LOW (ref 13.0–17.0)
MCH: 30.1 pg (ref 26.0–34.0)
MCHC: 33 g/dL (ref 30.0–36.0)
MCV: 91 fL (ref 80.0–100.0)
Platelets: 287 K/uL (ref 150–400)
RBC: 2.56 MIL/uL — ABNORMAL LOW (ref 4.22–5.81)
RDW: 13.9 % (ref 11.5–15.5)
WBC: 11.2 K/uL — ABNORMAL HIGH (ref 4.0–10.5)
nRBC: 0 % (ref 0.0–0.2)

## 2021-07-07 MED ORDER — MELATONIN 3 MG PO TABS
3.0000 mg | ORAL_TABLET | Freq: Every day | ORAL | Status: DC
  Administered 2021-07-07 – 2021-07-09 (×3): 3 mg via ORAL
  Filled 2021-07-07 (×3): qty 1

## 2021-07-07 MED FILL — Heparin Sodium (Porcine) Inj 1000 Unit/ML: INTRAMUSCULAR | Qty: 30 | Status: AC

## 2021-07-07 MED FILL — Sodium Chloride IV Soln 0.9%: INTRAVENOUS | Qty: 2000 | Status: AC

## 2021-07-07 NOTE — Progress Notes (Signed)
PROGRESS NOTE    Chase Evans  ZOX:[id number] DOB: 10-11-1940 DOA: 07/01/2021 PCP: Allean Found, MD    Brief Narrative:   Chase Evans is a 81 y.o. male with past medical history significant for essential hypertension, hyperlipidemia, TIA, CKD stage V, BPH, dementia who presented to [facility] on 6/15 for T10-L2 fusion/decompression due to spinal stenosis.  Hospitalization complicated by delirium.  Hospitalist service consulted on 6/18 by neurosurgery for assistance in further evaluation.  Assessment & Plan:   Delirium superimposed on dementia Family reports patient with baseline memory issues and family history of dementia.  Patient also with history of sundowning during hospitalizations. --Delirium precautions --Get up during the day --Encourage a familiar face to remain present throughout the day --Keep blinds open and lights on during daylight hours --Minimize the use of opioids/benzodiazepines --Haldol as needed for agitation --Start melatonin 3 mg p.o. nightly  Spinal stenosis of lumbar region with neurogenic claudication Patient underwent bilateral L1-2 laminotomy, decompression by Dr. Arnoldo Morale on 07/01/2021. --Continue PT/OT efforts while inpatient --Pending CIR --Further per neurosurgery  Acute postoperative anemia due to expected blood loss Patient with baseline anemia associated with chronic renal disease. --Hgb 7.2>7.7; stable --Transfuse for hemoglobin less than 7.0 --Ferrous sulfate 320 mg p.o. twice daily  Acute renal failure on CKD stage V Baseline creatinine roughly 5.4.  Creatinine increased to 6.08 in the setting of acute urinary retention requiring in and out catheterization. --Nephrology consulted by neurosurgery --Cr 5.32>6.08>5.84 --Holding Lasix --Bladder scan as needed, in and out catheterization as needed --BMP in a.m.  Fever Patient with temperature 100.8 on 07/04/2021; has been afebrile since.  Urinalysis negative for UTI.   --Blood  culture x 2 6/19: No growth x2 days --Continue to monitor fever curve off of antibiotic  Acute urinary retention BPH Etiology likely secondary to poor mobility following recent surgical intervention.  Required in and out catheterization. --Continue bladder scan as above --Tamsulosin 0.4 mg p.o. daily  Essential hypertension --Amlodipine 2.5 mg p.o. daily  Hyperlipidemia --Atorvastatin 10 mg p.o. daily   DVT prophylaxis: SCD's Start: 07/01/21 2001    Code Status: Full Code Family Communication: No family present at bedside this morning  Disposition Plan:  Level of care: Med-Surg Status is: Inpatient Remains inpatient appropriate because: Pending CIR placement     Procedures:  Bilateral L1-2 laminotomy/foraminotomies/medial facetectomy with decompression L1/L2 nerve roots  Antimicrobials:  Perioperative vancomycin   Subjective: Patient seen examined bedside, resting comfortably.  Sitting in bedside chair with brace on.  Complaining of mild back pain.  Awaiting CIR bed.  Creatinine improved.  No other questions or concerns at this time.  Denies headache, no chest pain, no shortness of breath, no abdominal pain, no current fever, no chills/night sweats, no nausea/vomiting/diarrhea, no focal weakness.  No acute events overnight per nurse staff.  Objective: Vitals:   07/07/21 0300 07/07/21 0728 07/07/21 1102 07/07/21 1200  BP: (!) 152/83 (!) 170/88 117/67   Pulse: 99 (!) 111 (!) 117   Resp: 16 18 18 18   Temp: 99.2 F (37.3 C) 98 F (36.7 C) 98 F (36.7 C)   TempSrc: Oral     SpO2: 95% 96% 98%   Weight:      Height:        Intake/Output Summary (Last 24 hours) at 07/07/2021 1342 Last data filed at 07/07/2021 0400 Gross per 24 hour  Intake --  Output 2000 ml  Net -2000 ml   Filed Weights   07/01/21 1038  Weight: 73.9 kg  Examination:  Physical Exam: GEN: NAD, alert and oriented x 3, chronically ill/elderly in appearance HEENT: NCAT, PERRL, EOMI, sclera  clear, MMM PULM: CTAB w/o wheezes/crackles, normal respiratory effort, on room air CV: RRR w/o M/G/R GI: abd soft, NTND, NABS, no R/G/M MSK: no peripheral edema, moves all extremities independently with muscle strength preserved NEURO: CN II-XII intact, no focal deficits, sensation to light touch intact PSYCH: normal mood/affect Integumentary: dry/intact, no rashes or wounds    Data Reviewed: I have personally reviewed following labs and imaging studies  CBC: Recent Labs  Lab 07/02/21 0502 07/04/21 0811 07/05/21 0344 07/06/21 0126 07/07/21 0235  WBC 15.7* 15.0* 13.2* 11.9* 11.2*  HGB 9.2* 7.6* 7.7* 7.2* 7.7*  HCT 27.8* 24.2* 23.2* 22.6* 23.3*  MCV 92.1 93.8 91.7 93.8 91.0  PLT 209 239 220 243 702   Basic Metabolic Panel: Recent Labs  Lab 07/03/21 0254 07/04/21 0811 07/05/21 0344 07/06/21 0126 07/07/21 0235  NA 136 139 139 141 139  K 4.3 4.1 4.3 4.5 3.5  CL 108 109 107 109 103  CO2 20* 20* 21* 20* 20*  GLUCOSE 139* 100* 113* 109* 114*  BUN 60* 59* 64* 73* 76*  CREATININE 5.38* 5.38* 5.65* 6.08* 5.84*  CALCIUM 7.7* 8.0* 8.0* 8.2* 8.3*   GFR: Estimated Creatinine Clearance: 9.4 mL/min (A) (by C-G formula based on SCr of 5.84 mg/dL (H)). Liver Function Tests: No results for input(s): "AST", "ALT", "ALKPHOS", "BILITOT", "PROT", "ALBUMIN" in the last 168 hours. No results for input(s): "LIPASE", "AMYLASE" in the last 168 hours. No results for input(s): "AMMONIA" in the last 168 hours. Coagulation Profile: No results for input(s): "INR", "PROTIME" in the last 168 hours. Cardiac Enzymes: No results for input(s): "CKTOTAL", "CKMB", "CKMBINDEX", "TROPONINI" in the last 168 hours. BNP (last 3 results) No results for input(s): "PROBNP" in the last 8760 hours. HbA1C: No results for input(s): "HGBA1C" in the last 72 hours. CBG: No results for input(s): "GLUCAP" in the last 168 hours. Lipid Profile: No results for input(s): "CHOL", "HDL", "LDLCALC", "TRIG", "CHOLHDL",  "LDLDIRECT" in the last 72 hours. Thyroid Function Tests: No results for input(s): "TSH", "T4TOTAL", "FREET4", "T3FREE", "THYROIDAB" in the last 72 hours. Anemia Panel: No results for input(s): "VITAMINB12", "FOLATE", "FERRITIN", "TIBC", "IRON", "RETICCTPCT" in the last 72 hours. Sepsis Labs: No results for input(s): "PROCALCITON", "LATICACIDVEN" in the last 168 hours.  Recent Results (from the past 240 hour(s))  Culture, blood (Routine X 2) w Reflex to ID Panel     Status: None (Preliminary result)   Collection Time: 07/05/21  8:00 AM   Specimen: BLOOD LEFT HAND  Result Value Ref Range Status   Specimen Description BLOOD LEFT HAND  Final   Special Requests   Final    BOTTLES DRAWN AEROBIC AND ANAEROBIC Blood Culture adequate volume   Culture   Final    NO GROWTH 2 DAYS Performed at [facility], [address], [place], [place] [address]    Report Status PENDING  Incomplete  Culture, blood (Routine X 2) w Reflex to ID Panel     Status: None (Preliminary result)   Collection Time: 07/05/21  9:05 AM   Specimen: BLOOD  Result Value Ref Range Status   Specimen Description BLOOD LEFT ANTECUBITAL  Final   Special Requests   Final    BOTTLES DRAWN AEROBIC AND ANAEROBIC Blood Culture adequate volume   Culture   Final    NO GROWTH 2 DAYS Performed at [facility], [place]  [address] [place], [place] [address]    Report Status PENDING  Incomplete         Radiology Studies: No results found.      Scheduled Meds:  allopurinol  100 mg Oral Daily   amLODipine  2.5 mg Oral Daily   atorvastatin  10 mg Oral q1800   docusate sodium  100 mg Oral BID   ferrous sulfate  325 mg Oral BID WC   sodium chloride flush  3 mL Intravenous Q12H   tamsulosin  0.4 mg Oral Daily   Continuous Infusions:  sodium chloride       LOS: 6 days    Time spent: 42 minutes spent on chart review, discussion with nursing staff, consultants, updating family and interview/physical exam; more  than 50% of that time was spent in counseling and/or coordination of care.    Yuniel Blaney J [place] DO [facility] Available via Epic secure chat 7am-7pm After these hours, please refer to coverage provider listed on [email or web address] 07/07/2021, 1:42 PM

## 2021-07-07 NOTE — Progress Notes (Signed)
Physical Therapy Treatment Patient Details Name: Chase Evans MRN: [id number] DOB: [birth year] Today's Date: 07/07/2021   History of Present Illness Pt is an 81 y/o male who presented for L1-2 fusion with extension to T10 in setting of spinal stenosis. PMH: BPH, CKD, HTN, TIA, hx of back sx.    PT Comments    Pt progressing well from a mobility stand point however is more confused today and only oriented to self and situation. Pt with improved ambulation tolerance to 120' with RW and modA. Pt getting days/nights mixed up, reports being up in chair all day and night but then states how he can't stand the bed because it's too painful while RN reported getting pt up in chair around 7:30am because he kept trying to get out of bed. Pt with regression cognitively but progression functionally. Acute PT to cont to follow.    Recommendations for follow up therapy are one component of a multi-disciplinary discharge planning process, led by the attending physician.  Recommendations may be updated based on patient status, additional functional criteria and insurance authorization.  Follow Up Recommendations  Acute inpatient rehab (3hours/day)     Assistance Recommended at Discharge Frequent or constant Supervision/Assistance  Patient can return home with the following Two people to help with walking and/or transfers;Two people to help with bathing/dressing/bathroom;Direct supervision/assist for medications management;Direct supervision/assist for financial management;Assistance with cooking/housework;Assist for transportation;Help with stairs or ramp for entrance   Equipment Recommendations  Rolling walker (2 wheels);BSC/3in1    Recommendations for Other Services Rehab consult     Precautions / Restrictions Precautions Precautions: Fall;Back Precaution Booklet Issued: Yes (comment) Precaution Comments: pt with no recall, pt re-educated on back precautions but again no recall at end of  session Required Braces or Orthoses: Spinal Brace Spinal Brace: Thoracolumbosacral orthotic;Applied in sitting position (pt dependent for donning brace) Restrictions Weight Bearing Restrictions: No     Mobility  Bed Mobility               General bed mobility comments: pt received sitting up in chair    Transfers Overall transfer level: Needs assistance Equipment used: Rolling walker (2 wheels) Transfers: Sit to/from Stand Sit to Stand: Min assist           General transfer comment: verbal cues for safe hand placement, increased time, minA for initial power up    Ambulation/Gait Ambulation/Gait assistance: Mod assist, +2 safety/equipment (+2 helpful for chair follow) Gait Distance (Feet): 120 Feet Assistive device: Rolling walker (2 wheels) Gait Pattern/deviations: Decreased step length - right, Decreased step length - left, Decreased stride length, Decreased weight shift to right, Decreased weight shift to left, Knee flexed in stance - left, Knee flexed in stance - right, Trunk flexed, Narrow base of support, Step-through pattern Gait velocity: decreased Gait velocity interpretation: <1.31 ft/sec, indicative of household ambulator   General Gait Details: max verbal cues to maintain upright posture, pt with tendency to flex bilat knees, modA for walker management to prevent pt from pushing RW to far forward, pt would occasionally lift L UE off RW and would have noted bilat knee instability requiring modA to prevent buckling, pt easily distracted with poor carry over of safe walker management, pt with very  narrow base of support   Stairs             Wheelchair Mobility    Modified Rankin (Stroke Patients Only)       Balance Overall balance assessment: Needs assistance Sitting-balance support: No upper extremity  supported, Feet supported Sitting balance-Leahy Scale: Fair Sitting balance - Comments: posterior bias , verbal cues to bring trunk  forward Postural control: Posterior lean Standing balance support: During functional activity, No upper extremity supported, Bilateral upper extremity supported, Single extremity supported Standing balance-Leahy Scale: Poor Standing balance comment: pt dependent on external support for safe standing                            Cognition Arousal/Alertness: Awake/alert Behavior During Therapy: WFL for tasks assessed/performed, Impulsive Overall Cognitive Status: Impaired/Different from baseline Area of Impairment: Attention, Memory, Following commands, Safety/judgement, Awareness, Problem solving, Orientation                 Orientation Level: Disoriented to, Time (pt getting days and nights messed up, pt re-oriented to time and date) Current Attention Level: Sustained Memory: Decreased short-term memory Following Commands: Follows one step commands with increased time Safety/Judgement: Decreased awareness of safety, Decreased awareness of deficits (per RN, pt kept trying to get up out of bed this morning) Awareness: Emergent Problem Solving: Difficulty sequencing, Requires verbal cues, Requires tactile cues, Decreased initiation, Slow processing General Comments: pt slightly irritated with RN staff today but pleasant with therapy, pt more confused today, getting days and nights screwed up, reports being up in the chair all day and night but then states he couldn't stand the bed, RN stated he assist pt up out of the bed at 730 because he kept trying to get up        Exercises      General Comments General comments (skin integrity, edema, etc.): RR HR 116bpm, HR 121bpm during amb      Pertinent Vitals/Pain Pain Assessment Pain Assessment: 0-10 Pain Score: 3  Body Language: rigid, fists clenched, knees up, pushing/pulling away, strikes out Pain Location: lower back Pain Descriptors / Indicators: Grimacing, Guarding    Home Living                           Prior Function            PT Goals (current goals can now be found in the care plan section) Acute Rehab PT Goals PT Goal Formulation: With patient/family Time For Goal Achievement: 07/16/21 Potential to Achieve Goals: Fair Progress towards PT goals: Progressing toward goals    Frequency    Min 5X/week      PT Plan Current plan remains appropriate    Co-evaluation              AM-PAC PT "6 Clicks" Mobility   Outcome Measure  Help needed turning from your back to your side while in a flat bed without using bedrails?: A Lot Help needed moving from lying on your back to sitting on the side of a flat bed without using bedrails?: A Lot Help needed moving to and from a bed to a chair (including a wheelchair)?: A Lot Help needed standing up from a chair using your arms (e.g., wheelchair or bedside chair)?: A Lot Help needed to walk in hospital room?: A Lot Help needed climbing 3-5 steps with a railing? : A Lot 6 Click Score: 12    End of Session Equipment Utilized During Treatment: Gait belt;Back brace Activity Tolerance: Patient tolerated treatment well Patient left: with call bell/phone within reach;with family/visitor present;in chair;with chair alarm set Nurse Communication: Mobility status PT Visit Diagnosis: Unsteadiness on feet (R26.81);Muscle weakness (generalized) (  M62.81);Difficulty in walking, not elsewhere classified (R26.2);Repeated falls (R29.6);History of falling (Z91.81);Other abnormalities of gait and mobility (R26.89)     Time: 5701-7793 PT Time Calculation (min) (ACUTE ONLY): 24 min  Charges:  $Gait Training: 23-37 mins                     Chase Evans, PT, DPT Acute Rehabilitation Services Secure chat preferred Office #: [phone]    Chase Evans 07/07/2021, 10:35 AM

## 2021-07-07 NOTE — Plan of Care (Signed)
  Problem: Education: Goal: Knowledge of General Education information will improve Description: Including pain rating scale, medication(s)/side effects and non-pharmacologic comfort measures Outcome: Progressing   Problem: Activity: Goal: Ability to tolerate increased activity will improve Outcome: Progressing   

## 2021-07-07 NOTE — Progress Notes (Signed)
Subjective: The patient is alert and pleasant.  His back is appropriately sore.  Objective: Vital signs in last 24 hours: Temp:  [98 F (36.7 C)-99.5 F (37.5 C)] 98 F (36.7 C) (06/21 0728) Pulse Rate:  [99-115] 111 (06/21 0728) Resp:  [16-18] 18 (06/21 0728) BP: (147-170)/(73-89) 170/88 (06/21 0728) SpO2:  [94 %-100 %] 96 % (06/21 0728) Estimated body mass index is 25.53 kg/m as calculated from the following:   Height as of this encounter: 5\' 7"  (1.702 m).   Weight as of this encounter: 73.9 kg.   Intake/Output from previous day: 06/20 0701 - 06/21 0700 In: 60 [P.O.:60] Out: 2150 [Urine:2150] Intake/Output this shift: No intake/output data recorded.  Physical exam the patient is alert and oriented x1, person.  He is pleasant and interactive.  His lower extremity strength is grossly normal.  Lab Results: Recent Labs    07/06/21 0126 07/07/21 0235  WBC 11.9* 11.2*  HGB 7.2* 7.7*  HCT 22.6* 23.3*  PLT 243 287   BMET Recent Labs    07/06/21 0126 07/07/21 0235  NA 141 139  K 4.5 3.5  CL 109 103  CO2 20* 20*  GLUCOSE 109* 114*  BUN 73* 76*  CREATININE 6.08* 5.84*  CALCIUM 8.2* 8.3*    Studies/Results: No results found.  Assessment/Plan: Postop day #6: We are awaiting rehab placement.  Acute blood loss anemia: His hemoglobin is up a bit today.  He does not appear particularly symptomatic.  Renal disease: His creatinine is down a bit today.  We are awaiting renal's input.  I appreciate Dr. Jeannine Kitten help.  LOS: 6 days     [name] 07/07/2021, 8:56 AM     Patient ID: Chase Evans, male   DOB: 1940/11/15, 81 y.o.   MRN: [id number]

## 2021-07-07 NOTE — Consult Note (Signed)
Chase Evans  HISTORY AND PHYSICAL  Chase Evans is an 81 y.o. male.    Chief Complaint: neurogenic claudication  HPI: Pt is an 85 M with a PMH sig for HTN, HLD, h/o neurogenic claudication and CKD V who is now seen in consultation at the request of [name] for eval and recs re: AKI on CKD.    Pt is followed by [name] in [place].  Last cr was 5.45 on 06/22/21.  They were discussing preparations for dialysis at that time in a hypothetical situation--> pt was not displaying signs of uremia.  They also discussed the possibility that surgery could accelerate progression of CKD  Pt underwent T10-L2 fusion due to spinal stenosis of the lumbar spine on 6/15.  Hospitalization was complicated by delirium.    Cr  5.32 6/16--> 5.38 6/17--> 5/38 6/18 --> 5.65 6/19 --> 6.08 6/20 --> 5.84 6/21, prompting evaluation.   Looks like he has had some increased PVRs in the past 2 days requiring I/O cath- largest 1100 mL yesterday AM Nephrotoxic meds and no lower BP.  Has some anemia in the 8s.    Daughters are at bedside.  Appetite is improving.   No dysgeusia, anorexia, n/v, jerking movements, itching.    PMH: Past Medical History:  Diagnosis Date   Arthritis    BPH (benign prostatic hyperplasia)    Family history of anesthesia complication    mother N/V   Hyperlipemia    Hypertension    Sleep apnea    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HCC)    TIA (transient ischemic attack)    PSH: Past Surgical History:  Procedure Laterality Date   BACK SURGERY  05/12/08   CERVICAL DISC SURGERY      Past Medical History:  Diagnosis Date   Arthritis    BPH (benign prostatic hyperplasia)    Family history of anesthesia complication    mother N/V   Hyperlipemia    Hypertension    Sleep apnea    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HCC)    TIA (transient ischemic attack)     Medications:  Scheduled:  allopurinol  100 mg Oral Daily   amLODipine  2.5 mg Oral Daily   atorvastatin  10  mg Oral q1800   docusate sodium  100 mg Oral BID   ferrous sulfate  325 mg Oral BID WC   sodium chloride flush  3 mL Intravenous Q12H   tamsulosin  0.4 mg Oral Daily    Medications Prior to Admission  Medication Sig Dispense Refill   allopurinol (ZYLOPRIM) 300 MG tablet Take 100 mg by mouth daily.     amLODipine (NORVASC) 5 MG tablet Take 2 tablets (10 mg total) by mouth daily. (Patient taking differently: Take 2.5 mg by mouth daily.) 90 tablet 1   Apoaequorin (PREVAGEN PO) Take 1 capsule by mouth daily.     Cyanocobalamin (B-12) 2500 MCG TABS Take 2,500 mcg by mouth daily.     furosemide (LASIX) 40 MG tablet Take 40 mg by mouth daily.     gabapentin (NEURONTIN) 600 MG tablet Take 100 mg by mouth 3 (three) times daily.     Omega-3 Fatty Acids (OMEGA 3 PO) Take 1-2 capsules by mouth See admin instructions. Take 2 capsules by mouth in the morning and 1 capsule in the evening     OVER THE COUNTER MEDICATION Take 1 capsule by mouth daily. N Balance 8     OVER THE COUNTER MEDICATION Take 2  capsules by mouth daily. B Flexible     tamsulosin (FLOMAX) 0.4 MG CAPS capsule Take 0.4 mg by mouth daily.     TURMERIC CURCUMIN PO Take 1 capsule by mouth daily.     atorvastatin (LIPITOR) 10 MG tablet Take 1 tablet (10 mg total) by mouth daily at 6 PM. (Patient not taking: Reported on 06/18/2021) 90 tablet 0   clopidogrel (PLAVIX) 75 MG tablet Take 75 mg by mouth daily.      ALLERGIES:   Allergies  Allergen Reactions   Nsaids Other (See Comments)    Chronic kidney disease   Penicillins Hives   Percocet [Oxycodone-Acetaminophen] Other (See Comments)    hallucinations   Crestor [Rosuvastatin] Other (See Comments)    Muscle pain    FAM HX: Family History  Problem Relation Age of Onset   Diabetes Mother    Heart disease Mother    Heart attack Mother     Social History:   reports that he quit smoking about 48 years ago. His smoking use included cigarettes. He has never used smokeless tobacco.  He reports that he does not drink alcohol and does not use drugs.  ROS: ROS: all other systems reviewed and are negative except as per HPI  Blood pressure 117/67, pulse (!) 117, temperature 98 F (36.7 C), resp. rate 18, height 5\' 7"  (1.702 m), weight 73.9 kg, SpO2 98 %. PHYSICAL EXAM: Physical Exam GEN NAD, sitting up in chair HEENT EOMI PERRL NECK flat neck veins PULM clear CV tachycardic, no m/r/g ABD soft EXT no LE edema NEURO AAO x 3 no asterixis SKIN decreased turgor MSK in back brace   Results for orders placed or performed during the hospital encounter of 07/01/21 (from the past 48 hour(s))  Basic metabolic panel     Status: Abnormal   Collection Time: 07/06/21  1:26 AM  Result Value Ref Range   Sodium 141 135 - 145 mmol/L   Potassium 4.5 3.5 - 5.1 mmol/L   Chloride 109 98 - 111 mmol/L   CO2 20 (L) 22 - 32 mmol/L   Glucose, Bld 109 (H) 70 - 99 mg/dL    Comment: Glucose reference range applies only to samples taken after fasting for at least 8 hours.   BUN 73 (H) 8 - 23 mg/dL   Creatinine, Ser 6.08 (H) 0.61 - 1.24 mg/dL   Calcium 8.2 (L) 8.9 - 10.3 mg/dL   GFR, Estimated 9 (L) >60 mL/min    Comment: (NOTE) Calculated using the CKD-EPI Creatinine Equation (2021)    Anion gap 12 5 - 15    Comment: Performed at [facility] [address] [place], [place] [address]  CBC     Status: Abnormal   Collection Time: 07/06/21  1:26 AM  Result Value Ref Range   WBC 11.9 (H) 4.0 - 10.5 K/uL   RBC 2.41 (L) 4.22 - 5.81 MIL/uL   Hemoglobin 7.2 (L) 13.0 - 17.0 g/dL   HCT 22.6 (L) 39.0 - 52.0 %   MCV 93.8 80.0 - 100.0 fL   MCH 29.9 26.0 - 34.0 pg   MCHC 31.9 30.0 - 36.0 g/dL   RDW 14.2 11.5 - 15.5 %   Platelets 243 150 - 400 K/uL   nRBC 0.0 0.0 - 0.2 %    Comment: Performed at [facility], [place] [address], [place], [place] [address]  Urinalysis, Routine w reflex microscopic     Status: Abnormal   Collection Time: 07/06/21  4:00 AM  Result Value Ref Range    Color, Urine STRAW (A) YELLOW   APPearance CLEAR CLEAR   Specific Gravity, Urine 1.009 1.005 - 1.030   pH 5.0 5.0 - 8.0   Glucose, UA NEGATIVE NEGATIVE mg/dL   Hgb urine dipstick MODERATE (A) NEGATIVE   Bilirubin Urine NEGATIVE NEGATIVE   Ketones, ur NEGATIVE NEGATIVE mg/dL   Protein, ur 100 (A) NEGATIVE mg/dL   Nitrite NEGATIVE NEGATIVE   Leukocytes,Ua NEGATIVE NEGATIVE   RBC / HPF 6-10 0 - 5 RBC/hpf   WBC, UA 0-5 0 - 5 WBC/hpf   Bacteria, UA NONE SEEN NONE SEEN   Squamous Epithelial / LPF 0-5 0 - 5    Comment: Performed at [facility] [address] [place], [place] [address]  Basic metabolic panel     Status: Abnormal   Collection Time: 07/07/21  2:35 AM  Result Value Ref Range   Sodium 139 135 - 145 mmol/L   Potassium 3.5 3.5 - 5.1 mmol/L    Comment: NO VISIBLE HEMOLYSIS   Chloride 103 98 - 111 mmol/L   CO2 20 (L) 22 - 32 mmol/L   Glucose, Bld 114 (H) 70 - 99 mg/dL    Comment: Glucose reference range applies only to samples taken after fasting for at least 8 hours.   BUN 76 (H) 8 - 23 mg/dL   Creatinine, Ser 5.84 (H) 0.61 - 1.24 mg/dL   Calcium 8.3 (L) 8.9 - 10.3 mg/dL   GFR, Estimated 9 (L) >60 mL/min    Comment: (NOTE) Calculated using the CKD-EPI Creatinine Equation (2021)    Anion gap 16 (H) 5 - 15    Comment: Performed at [facility] [address] [place], [place] [address]  CBC     Status: Abnormal   Collection Time: 07/07/21  2:35 AM  Result Value Ref Range   WBC 11.2 (H) 4.0 - 10.5 K/uL   RBC 2.56 (L) 4.22 - 5.81 MIL/uL   Hemoglobin 7.7 (L) 13.0 - 17.0 g/dL   HCT 23.3 (L) 39.0 - 52.0 %   MCV 91.0 80.0 - 100.0 fL   MCH 30.1 26.0 - 34.0 pg   MCHC 33.0 30.0 - 36.0 g/dL   RDW 13.9 11.5 - 15.5 %   Platelets 287 150 - 400 K/uL   nRBC 0.0 0.0 - 0.2 %    Comment: Performed at [facility], [place] [address] [place], [place] [address]    No results found.  Assessment/Plan   AKI on CKD V:  no uremic symptoms.  Not very far off his  baseline but urinary retention +/- slight prerenal physiology likely playing into this  - agree with PVR and I/O cath  - discussed drinking at least 32 oz fluid daily and more like 48 oz fluid daily dtrs are at bedside and are willing to encourage PO--> if unable to do so then will do small NS boluses  - no indication to pursue dialysis at present  - will follow closely with you  2.  Anemia:   - check iron panel  - iron + ESA if appropriate  3.  BMM:  - checking PTH and Vit D  4.  S/p T10-L2 fusion  - 6/15  - hopefully to CIR  5.  Dispo: CIR  Madelon Lips 07/07/2021, 1:45 PM

## 2021-07-07 NOTE — Progress Notes (Signed)
Inpatient Rehab Admissions Coordinator:  Pt. Is not yet medically for CIR, as he awaits renal consult.I spoke with pt. About plan for CIR once ready and he states that he is not sure he can tolerate 3 hours a day of therapy but would like to take a day to think about it and discuss wit his son. My colleague will follow up with this Pt. Tomorrow.   Clemens Catholic, [place], [place] Admissions Coordinator  [phone] ([employer]) [phone] (office)

## 2021-07-07 NOTE — Progress Notes (Signed)
Patient ID: [name], male   DOB: 1940/08/11, 81 y.o.   MRN: [id number]    Progress Note from the Palliative Medicine Team at [facility]   Patient Name: Chase Evans        Date: 07/07/2021 DOB: 1940-04-22  Age: 81 y.o. MRN#: [id number] Attending Physician: Tressie Stalker, MD Primary Care Physician: Donata Duff, MD Admit Date: 07/01/2021   Medical records reviewed   Chase Evans is a 81 y.o. male with past medical history of BPH; stage 5 CKD; HTN; HLD; TIA; and OSA who presented for back surgery.  He was cleared for surgery pre-operatively by his nephrologist and noted to be at risk for progression to ESRD.  This was discussed with him pre-operatively by nephrology as well as anesthesiology.  He underwent T10-L2 fusion due to spinal stenosis of the lumbar spine on 6/15,  (failed medical management and was worked up with a lumbar myelo CT.  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     Worsening renal function as noted/creatinine 6.08 today    Patient and family face treatment option decisions, advanced directive decisions and anticipatory care needs.  This NP visited patient at the bedside as a follow up to  yesterday's GOCs meeting, patient's DIL/Linda and her sister at bedside.  Patient is out of bed to the chair, he is nibbling at his lunch tray, he is alert and oriented.  Continued education  regarding current medical situation.  Patient and family verbalize openness to all offered and available medical interventions to prolong life, including dialysis if indicated.  Seen by nephrology today, note reflects no need for hemodialysis at this time.  Reviewed his AD documents, living will and desire for a natural death.  Remains a full code.     Hope is for transition to CIR for short-term rehab before going to his son's home for further assistance.  Education offered today regarding   the importance of continued conversation with family and their  medical providers regarding overall plan of care and treatment options,  ensuring decisions are within the context of the patients values and GOCs.  Questions and concerns addressed    PMT will continue to support holistically   Chase Creed NP  Palliative Medicine Team Team Phone # [phone] Pager [phone]

## 2021-07-08 DIAGNOSIS — M48062 Spinal stenosis, lumbar region with neurogenic claudication: Secondary | ICD-10-CM | POA: Diagnosis not present

## 2021-07-08 LAB — CBC
HCT: 22.6 % — ABNORMAL LOW (ref 39.0–52.0)
Hemoglobin: 7.4 g/dL — ABNORMAL LOW (ref 13.0–17.0)
MCH: 30.6 pg (ref 26.0–34.0)
MCHC: 32.7 g/dL (ref 30.0–36.0)
MCV: 93.4 fL (ref 80.0–100.0)
Platelets: 283 10*3/uL (ref 150–400)
RBC: 2.42 MIL/uL — ABNORMAL LOW (ref 4.22–5.81)
RDW: 13.8 % (ref 11.5–15.5)
WBC: 9.7 10*3/uL (ref 4.0–10.5)
nRBC: 0 % (ref 0.0–0.2)

## 2021-07-08 LAB — BASIC METABOLIC PANEL
Anion gap: 13 (ref 5–15)
BUN: 78 mg/dL — ABNORMAL HIGH (ref 8–23)
CO2: 20 mmol/L — ABNORMAL LOW (ref 22–32)
Calcium: 8.2 mg/dL — ABNORMAL LOW (ref 8.9–10.3)
Chloride: 105 mmol/L (ref 98–111)
Creatinine, Ser: 5.8 mg/dL — ABNORMAL HIGH (ref 0.61–1.24)
GFR, Estimated: 9 mL/min — ABNORMAL LOW (ref >60)
Glucose, Bld: 120 mg/dL — ABNORMAL HIGH (ref 70–99)
Potassium: 3.6 mmol/L (ref 3.5–5.1)
Sodium: 138 mmol/L (ref 135–145)

## 2021-07-08 LAB — IRON AND TIBC
Iron: 16 ug/dL — ABNORMAL LOW (ref 45–182)
Saturation Ratios: 9 % — ABNORMAL LOW (ref 17.9–39.5)
TIBC: 183 ug/dL — ABNORMAL LOW (ref 250–450)
UIBC: 167 ug/dL

## 2021-07-08 LAB — VITAMIN D 25 HYDROXY (VIT D DEFICIENCY, FRACTURES): Vit D, 25-Hydroxy: 39.59 ng/mL (ref 30–100)

## 2021-07-08 LAB — FERRITIN: Ferritin: 476 ng/mL — ABNORMAL HIGH (ref 24–336)

## 2021-07-08 MED ORDER — METOPROLOL TARTRATE 5 MG/5ML IV SOLN
5.0000 mg | Freq: Once | INTRAVENOUS | Status: AC
Start: 2021-07-08 — End: 2021-07-08
  Administered 2021-07-08: 5 mg via INTRAVENOUS
  Filled 2021-07-08: qty 5

## 2021-07-08 MED ORDER — ORAL CARE MOUTH RINSE: 15.0000 mL | OROMUCOSAL | Status: DC | PRN

## 2021-07-08 MED ORDER — SODIUM CHLORIDE 0.9 % IV SOLN
250.0000 mg | Freq: Every day | INTRAVENOUS | Status: DC
  Administered 2021-07-08 – 2021-07-10 (×3): 250 mg via INTRAVENOUS
  Filled 2021-07-08 (×3): qty 20

## 2021-07-08 MED ORDER — DARBEPOETIN ALFA 40 MCG/0.4ML IJ SOSY
40.0000 ug | PREFILLED_SYRINGE | INTRAMUSCULAR | Status: DC
  Administered 2021-07-08: 40 ug via SUBCUTANEOUS
  Filled 2021-07-08: qty 0.4

## 2021-07-08 NOTE — Progress Notes (Signed)
Physical Therapy Treatment Patient Details Name: Chase Evans MRN: [id number] DOB: [birth year] Today's Date: 07/08/2021   History of Present Illness Pt is an 81 y/o male who presented for L1-2 fusion with extension to T10 in setting of spinal stenosis. PMH: BPH, CKD, HTN, TIA, hx of back sx.    PT Comments    Pt seated in recliner.  He is more alert and able to participate in PT session.   Progressed mobility to gt training.  He is mildly unsteady but continues to benefit from aggressive rehab in a post acute setting.    Recommendations for follow up therapy are one component of a multi-disciplinary discharge planning process, led by the attending physician.  Recommendations may be updated based on patient status, additional functional criteria and insurance authorization.  Follow Up Recommendations  Acute inpatient rehab (3hours/day)     Assistance Recommended at Discharge Frequent or constant Supervision/Assistance  Patient can return home with the following Two people to help with walking and/or transfers;Two people to help with bathing/dressing/bathroom;Direct supervision/assist for medications management;Direct supervision/assist for financial management;Assistance with cooking/housework;Assist for transportation;Help with stairs or ramp for entrance   Equipment Recommendations  Rolling walker (2 wheels);BSC/3in1    Recommendations for Other Services Rehab consult     Precautions / Restrictions Precautions Precautions: Fall;Back Precaution Booklet Issued: Yes (comment) Precaution Comments: pt with no recall, pt re-educated on back precautions but again no recall at end of session Required Braces or Orthoses: Spinal Brace Spinal Brace: Thoracolumbosacral orthotic;Applied in sitting position Restrictions Weight Bearing Restrictions: No     Mobility  Bed Mobility Overal bed mobility: Needs Assistance Bed Mobility: Rolling, Sit to Sidelying Rolling: Min assist        Sit to sidelying: Mod assist General bed mobility comments: Pt seated in recliner on arrival this session.  Assisted back to bed with support for trunk and rolling to lift legs.    Transfers Overall transfer level: Needs assistance Equipment used: Rolling walker (2 wheels) Transfers: Sit to/from Stand Sit to Stand: Min assist           General transfer comment: Cues for hand placement to and from seated surface.  Able to move to edge of recliner with VCs.    Ambulation/Gait Ambulation/Gait assistance: Mod assist Gait Distance (Feet): 120 Feet Assistive device: Rolling walker (2 wheels) Gait Pattern/deviations: Decreased step length - right, Decreased step length - left, Decreased stride length, Decreased weight shift to right, Decreased weight shift to left, Knee flexed in stance - left, Knee flexed in stance - right, Trunk flexed, Narrow base of support, Step-through pattern       General Gait Details: Cues for posture and position in RW.  pt required cues for forward gaze and hand placement on RW.  Cues to extend B Knees.   Stairs             Wheelchair Mobility    Modified Rankin (Stroke Patients Only)       Balance                                            Cognition Arousal/Alertness: Awake/alert Behavior During Therapy: WFL for tasks assessed/performed, Impulsive Overall Cognitive Status: Impaired/Different from baseline Area of Impairment: Attention, Memory, Following commands, Safety/judgement, Awareness, Problem solving, Orientation  Orientation Level: Disoriented to, Situation Current Attention Level: Selective Memory: Decreased short-term memory Following Commands: Follows one step commands with increased time Safety/Judgement: Decreased awareness of safety, Decreased awareness of deficits Awareness: Emergent Problem Solving: Difficulty sequencing, Requires verbal cues, Requires tactile cues, Decreased  initiation, Slow processing General Comments: Pt more alert this session and tolerated increased activity.        Exercises      General Comments        Pertinent Vitals/Pain Pain Assessment Pain Assessment: Faces Faces Pain Scale: Hurts a little bit Pain Location: lower back Pain Descriptors / Indicators: Grimacing, Guarding Pain Intervention(s): Monitored during session, Repositioned    Home Living                          Prior Function            PT Goals (current goals can now be found in the care plan section) Acute Rehab PT Goals Patient Stated Goal: did not state Potential to Achieve Goals: Fair Progress towards PT goals: Progressing toward goals    Frequency    Min 5X/week      PT Plan Current plan remains appropriate    Co-evaluation              AM-PAC PT "6 Clicks" Mobility   Outcome Measure  Help needed turning from your back to your side while in a flat bed without using bedrails?: A Lot Help needed moving from lying on your back to sitting on the side of a flat bed without using bedrails?: A Lot Help needed moving to and from a bed to a chair (including a wheelchair)?: A Lot Help needed standing up from a chair using your arms (e.g., wheelchair or bedside chair)?: A Lot Help needed to walk in hospital room?: A Lot Help needed climbing 3-5 steps with a railing? : A Lot 6 Click Score: 12    End of Session Equipment Utilized During Treatment: Gait belt;Back brace Activity Tolerance: Patient tolerated treatment well Patient left: with call bell/phone within reach;with family/visitor present;in chair;with chair alarm set Nurse Communication: Mobility status PT Visit Diagnosis: Unsteadiness on feet (R26.81);Muscle weakness (generalized) (M62.81);Difficulty in walking, not elsewhere classified (R26.2);Repeated falls (R29.6);History of falling (Z91.81);Other abnormalities of gait and mobility (R26.89)     Time: 5366-4403 PT Time  Calculation (min) (ACUTE ONLY): 33 min  Charges:  $Gait Training: 8-22 mins $Therapeutic Activity: 8-22 mins                     Erasmo Leventhal , PTA Acute Rehabilitation Services  Office [phone]    Cristela Blue 07/08/2021, 2:43 PM

## 2021-07-08 NOTE — Progress Notes (Signed)
Patient had bladder scan performed at 2348 demonstrating 767 cc. Intermittent catherization performed and 725 cc retrieved.

## 2021-07-08 NOTE — Progress Notes (Addendum)
PROGRESS NOTE    Chase Evans  LFY:[id number] DOB: 09/09/1940 DOA: 07/01/2021 PCP: Allean Found, MD    Brief Narrative:   Chase Evans is a 81 y.o. male with past medical history significant for essential hypertension, hyperlipidemia, TIA, CKD stage V, BPH, dementia who presented to [facility] on 6/15 for T10-L2 fusion/decompression due to spinal stenosis.  Hospitalization complicated by delirium.  Hospitalist service consulted on 6/18 by neurosurgery for assistance in further evaluation.  Assessment & Plan:   Delirium superimposed on dementia Family reports patient with baseline memory issues and family history of dementia.  Patient also with history of sundowning during hospitalizations. --Delirium precautions --Get up during the day --Encourage a familiar face to remain present throughout the day --Keep blinds open and lights on during daylight hours --Minimize the use of opioids/benzodiazepines --Haldol as needed for agitation --Started melatonin 3 mg p.o. nightly  Spinal stenosis of lumbar region with neurogenic claudication Patient underwent bilateral L1-2 laminotomy, decompression by Dr. Arnoldo Morale on 07/01/2021. --Continue PT/OT efforts while inpatient --Pending CIR --Further per neurosurgery  Acute postoperative anemia due to expected blood loss Patient with baseline anemia associated with chronic renal disease.  Anemia panel with iron 16, TIBC 153, ferritin 476.  --Hgb 7.2>7.7>7.4; stable --Nephrology ordering ferric gluconate 250 mg daily x4 doses today --Transfuse for hemoglobin less than 7.0 --Ferrous sulfate 325 mg p.o. twice daily  Acute renal failure on CKD stage V Baseline creatinine roughly 5.4.  Creatinine increased to 6.08 in the setting of acute urinary retention requiring in and out catheterization. --Nephrology consulted by neurosurgery --Cr 5.32>6.08>5.84>5.80 --Holding Lasix --Bladder scan as needed, in and out catheterization as  needed --BMP in a.m.  Fever Patient with temperature 100.8 on 07/04/2021; has been afebrile since.  Urinalysis negative for UTI.   --Blood culture x 2 6/19: No growth x2 days --Continue to monitor fever curve off of antibiotic  Acute urinary retention BPH Etiology likely secondary to poor mobility following recent surgical intervention.  Required in and out catheterization. --Continue bladder scan as above --Tamsulosin 0.4 mg p.o. daily  Essential hypertension --Amlodipine 2.5 mg p.o. daily  Hyperlipidemia --Atorvastatin 10 mg p.o. daily   DVT prophylaxis: SCD's Start: 07/01/21 2001    Code Status: Full Code Family Communication: Updated patient's son and daughter-in-law present at bedside this morning  Disposition Plan:  Level of care: Med-Surg Status is: Inpatient Remains inpatient appropriate because: Pending CIR placement     Procedures:  Bilateral L1-2 laminotomy/foraminotomies/medial facetectomy with decompression L1/L2 nerve roots  Antimicrobials:  Perioperative vancomycin   Subjective: Patient seen examined bedside, resting comfortably.  Lying in bed.  Son and daughter-in-law present.  Pleasantly confused.  Required in and out catheterization overnight for urinary retention with 725 mL out.  Awaiting CIR bed.  No other questions or concerns at this time.  Denies headache, no chest pain, no shortness of breath, no abdominal pain, no current fever, no chills/night sweats, no nausea/vomiting/diarrhea, no focal weakness.  No other acute events overnight per nursing staff.  Objective: Vitals:   07/08/21 0357 07/08/21 0424 07/08/21 0814 07/08/21 1121  BP: (!) 169/85 (!) 168/79 (!) 148/68 124/73  Pulse: (!) 105  100 (!) 102  Resp: 19  16 16   Temp: 98.2 F (36.8 C)  98.6 F (37 C) 98.4 F (36.9 C)  TempSrc: Oral  Oral Oral  SpO2: 98%  97% 98%  Weight:      Height:        Intake/Output Summary (Last 24 hours) at  07/08/2021 1253 Last data filed at 07/07/2021  2356 Gross per 24 hour  Intake --  Output 1050 ml  Net -1050 ml   Filed Weights   07/01/21 1038  Weight: 73.9 kg    Examination:  Physical Exam: GEN: NAD, alert and oriented x 3, chronically ill/elderly in appearance HEENT: NCAT, PERRL, EOMI, sclera clear, MMM PULM: CTAB w/o wheezes/crackles, normal respiratory effort, on room air with SPO2 98% on room air CV: RRR w/o M/G/R GI: abd soft, NTND, NABS, no R/G/M MSK: no peripheral edema, moves all extremities independently with muscle strength preserved NEURO: CN II-XII intact, no focal deficits, sensation to light touch intact PSYCH: normal mood/affect Integumentary: dry/intact, no rashes or wounds    Data Reviewed: I have personally reviewed following labs and imaging studies  CBC: Recent Labs  Lab 07/04/21 0811 07/05/21 0344 07/06/21 0126 07/07/21 0235 07/08/21 0203  WBC 15.0* 13.2* 11.9* 11.2* 9.7  HGB 7.6* 7.7* 7.2* 7.7* 7.4*  HCT 24.2* 23.2* 22.6* 23.3* 22.6*  MCV 93.8 91.7 93.8 91.0 93.4  PLT 239 220 243 287 614   Basic Metabolic Panel: Recent Labs  Lab 07/04/21 0811 07/05/21 0344 07/06/21 0126 07/07/21 0235 07/08/21 0203  NA 139 139 141 139 138  K 4.1 4.3 4.5 3.5 3.6  CL 109 107 109 103 105  CO2 20* 21* 20* 20* 20*  GLUCOSE 100* 113* 109* 114* 120*  BUN 59* 64* 73* 76* 78*  CREATININE 5.38* 5.65* 6.08* 5.84* 5.80*  CALCIUM 8.0* 8.0* 8.2* 8.3* 8.2*   GFR: Estimated Creatinine Clearance: 9.5 mL/min (A) (by C-G formula based on SCr of 5.8 mg/dL (H)). Liver Function Tests: No results for input(s): "AST", "ALT", "ALKPHOS", "BILITOT", "PROT", "ALBUMIN" in the last 168 hours. No results for input(s): "LIPASE", "AMYLASE" in the last 168 hours. No results for input(s): "AMMONIA" in the last 168 hours. Coagulation Profile: No results for input(s): "INR", "PROTIME" in the last 168 hours. Cardiac Enzymes: No results for input(s): "CKTOTAL", "CKMB", "CKMBINDEX", "TROPONINI" in the last 168 hours. BNP (last  3 results) No results for input(s): "PROBNP" in the last 8760 hours. HbA1C: No results for input(s): "HGBA1C" in the last 72 hours. CBG: No results for input(s): "GLUCAP" in the last 168 hours. Lipid Profile: No results for input(s): "CHOL", "HDL", "LDLCALC", "TRIG", "CHOLHDL", "LDLDIRECT" in the last 72 hours. Thyroid Function Tests: No results for input(s): "TSH", "T4TOTAL", "FREET4", "T3FREE", "THYROIDAB" in the last 72 hours. Anemia Panel: Recent Labs    07/08/21 0203  FERRITIN 476*  TIBC 183*  IRON 16*   Sepsis Labs: No results for input(s): "PROCALCITON", "LATICACIDVEN" in the last 168 hours.  Recent Results (from the past 240 hour(s))  Culture, blood (Routine X 2) w Reflex to ID Panel     Status: None (Preliminary result)   Collection Time: 07/05/21  8:00 AM   Specimen: BLOOD LEFT HAND  Result Value Ref Range Status   Specimen Description BLOOD LEFT HAND  Final   Special Requests   Final    BOTTLES DRAWN AEROBIC AND ANAEROBIC Blood Culture adequate volume   Culture   Final    NO GROWTH 3 DAYS Performed at [facility], [address] [place], [place] [address]    Report Status PENDING  Incomplete  Culture, blood (Routine X 2) w Reflex to ID Panel     Status: None (Preliminary result)   Collection Time: 07/05/21  9:05 AM   Specimen: BLOOD  Result Value Ref Range Status   Specimen  Description BLOOD LEFT ANTECUBITAL  Final   Special Requests   Final    BOTTLES DRAWN AEROBIC AND ANAEROBIC Blood Culture adequate volume   Culture   Final    NO GROWTH 3 DAYS Performed at [facility], [address] [place], [place] [address]    Report Status PENDING  Incomplete         Radiology Studies: US RENAL  Result Date: 07/07/2021 CLINICAL DATA:  Obstructive uropathy EXAM: RENAL / URINARY TRACT ULTRASOUND COMPLETE COMPARISON:  06/22/2021, 02/06/2014 FINDINGS: Right Kidney: Renal measurements: 8.3 x 4.7 x 3.8 cm = volume: 77.8 mL. Cortex appears echogenic  with decreased corticomedullary differentiation. No hydronephrosis. Anechoic cyst containing thin septation upper pole right kidney measures 3.9 cm. 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 Left Kidney: Renal measurements: 9.4 x 6.2 x 4 cm = volume: 121.7 mL. Cortex appears echogenic with decreased corticomedullary differentiation. No hydronephrosis. Previously measured cyst is not well visualized today. Bladder: Appears normal for degree of bladder distention. Other: None. IMPRESSION: 1. No significant hydronephrosis. 2. Echogenic appearing cortex consistent with medical renal disease 3. Cyst in the upper pole right kidney, no follow-up imaging is recommended Electronically Signed   By: Donavan Foil M.D.   On: 07/07/2021 19:48        Scheduled Meds:  allopurinol  100 mg Oral Daily   amLODipine  2.5 mg Oral Daily   atorvastatin  10 mg Oral q1800   darbepoetin (ARANESP) injection - NON-DIALYSIS  40 mcg Subcutaneous Q Thu-1800   docusate sodium  100 mg Oral BID   ferrous sulfate  325 mg Oral BID WC   melatonin  3 mg Oral QHS   sodium chloride flush  3 mL Intravenous Q12H   tamsulosin  0.4 mg Oral Daily   Continuous Infusions:  sodium chloride     ferric gluconate (FERRLECIT) IVPB 250 mg (07/08/21 1043)     LOS: 7 days    Time spent: 42 minutes spent on chart review, discussion with nursing staff, consultants, updating family and interview/physical exam; more than 50% of that time was spent in counseling and/or coordination of care.    Mariame Rybolt J [place] DO [facility] Available via Epic secure chat 7am-7pm After these hours, please refer to coverage provider listed on [email or web address] 07/08/2021, 12:53 PM

## 2021-07-08 NOTE — Progress Notes (Signed)
IP rehab admissions - I met with patient and family today.  Family are interested in [facility] admission.  Currently no bed available on CIR for this patient for today.  I will have my partner follow up tomorrow for bed availability.  Call for questions.  [phone]

## 2021-07-08 NOTE — Progress Notes (Addendum)
Paged Dr Bridgett Larsson through [facility] to see if wanted pt to be I&O cathed or to have a foley placed.  He has not been able to void on his own and has been I&O cath with each bladder scan as per protocol.  Was given a urinal and even was stood to attempt to void.  Said he has no urge and cannot do it but wanted to keep the urinal just in case.  I even turned on the water and had him to put his hand in the water to attempt to help him but was unsuccessful.    Dr Bridgett Larsson responded and said rounding team wants to continue I&O caths and to document in progress notes that he continues to have urinary retention. Michela Pitcher maybe they will order foley catheter tomorrow if you document his continued needs for I&Os caths.

## 2021-07-08 NOTE — Progress Notes (Signed)
Occupational Therapy Treatment Patient Details Name: Chase Evans MRN: [id number] DOB: 1940-12-31 Today's Date: 07/08/2021   History of present illness Pt is an 81 y/o male who presented for L1-2 fusion with extension to T10 in setting of spinal stenosis. PMH: BPH, CKD, HTN, TIA, hx of back sx.   OT comments  Pt with noted cognitive and physical improvements during session today. Pt continues to require extensive assist for TLSO mgmt but showing improvements in LB dressing abilities. Pt with noted B knee extension difficulties with prolonged standing but able to maintain balance to complete ADLs standing at sink with no more than Min A. Pt able to mobilize in hallway with Min A x 2 (family also assisting with chair follow) with balance/RW pacing assist. Intermittent cues needed for back precautions and safety. Family supportive and still hopeful for AIR. While pt was fatigued by end of session, pt eager to participate in all tasks and anticipate pt will tolerate AIR level therapies well.    Recommendations for follow up therapy are one component of a multi-disciplinary discharge planning process, led by the attending physician.  Recommendations may be updated based on patient status, additional functional criteria and insurance authorization.    Follow Up Recommendations  Acute inpatient rehab (3hours/day)    Assistance Recommended at Discharge Frequent or constant Supervision/Assistance  Patient can return home with the following  Assistance with cooking/housework;Direct supervision/assist for medications management;Direct supervision/assist for financial management;Assist for transportation;Help with stairs or ramp for entrance;A lot of help with bathing/dressing/bathroom   Equipment Recommendations  BSC/3in1;Other (comment) (RW)    Recommendations for Other Services Rehab consult    Precautions / Restrictions Precautions Precautions: Fall;Back Precaution Booklet Issued: Yes  (comment) Required Braces or Orthoses: Spinal Brace Spinal Brace: Thoracolumbosacral orthotic;Applied in sitting position Restrictions Weight Bearing Restrictions: No       Mobility Bed Mobility Overal bed mobility: Needs Assistance Bed Mobility: Rolling, Sidelying to Sit Rolling: Min assist Sidelying to sit: Mod assist       General bed mobility comments: Mod A required to lift trunk and tactile cues to initiate LEs off of bed    Transfers Overall transfer level: Needs assistance Equipment used: Rolling walker (2 wheels) Transfers: Sit to/from Stand Sit to Stand: Min assist           General transfer comment: verbal cues for safe hand placement, increased time, minA for initial power up     Balance Overall balance assessment: Needs assistance Sitting-balance support: No upper extremity supported, Feet supported Sitting balance-Leahy Scale: Fair Sitting balance - Comments: posterior bias with dynamic tasks of donning socks sitting EOB   Standing balance support: During functional activity, No upper extremity supported, Bilateral upper extremity supported, Single extremity supported Standing balance-Leahy Scale: Poor                             ADL either performed or assessed with clinical judgement   ADL Overall ADL's : Needs assistance/impaired     Grooming: Minimal assistance;Standing;Oral care;Wash/dry face Grooming Details (indicate cue type and reason): MIn A for balance, tactile cues for knee extension while standing at sink > 5 min         Upper Body Dressing : Moderate assistance;Sitting Upper Body Dressing Details (indicate cue type and reason): Max A for TLSO brace mgmt though feel pt able to manage shirts, etc well. pt able to identify front/back of TLSO accurately though increased assist still needed  for brace mgmt Lower Body Dressing: Moderate assistance;Sit to/from stand Lower Body Dressing Details (indicate cue type and reason): Pt  able to effectively complete doffing/donning socks with figure four position with MIn A to assume/maintain position at times. Feel pt will need increased assist to don over waist due to balance deficits             Functional mobility during ADLs: Minimal assistance;+2 for safety/equipment;+2 for physical assistance;Rolling walker (2 wheels);Cueing for sequencing;Cueing for safety General ADL Comments: Improving cognition, standing endurance and mobility today though due to weakness, still at increased risk for falls    Extremity/Trunk Assessment Upper Extremity Assessment Upper Extremity Assessment: Generalized weakness   Lower Extremity Assessment Lower Extremity Assessment: Defer to PT evaluation        Vision   Vision Assessment?: No apparent visual deficits   Perception     Praxis      Cognition Arousal/Alertness: Awake/alert Behavior During Therapy: WFL for tasks assessed/performed, Impulsive Overall Cognitive Status: Impaired/Different from baseline Area of Impairment: Attention, Memory, Following commands, Safety/judgement, Awareness, Problem solving, Orientation                 Orientation Level: Disoriented to, Situation Current Attention Level: Selective Memory: Decreased short-term memory Following Commands: Follows one step commands with increased time Safety/Judgement: Decreased awareness of safety, Decreased awareness of deficits Awareness: Emergent Problem Solving: Difficulty sequencing, Requires verbal cues, Requires tactile cues, Decreased initiation, Slow processing General Comments: Pt initially lethargic on entry but awakened easily with activity. More quick-witted and engaged during session. follows directions consistently. noted memory deficits as pt was unsure which room was his after mobilizing in hallway. feel a lot of cognitive issues are related to hospital delirium        Exercises      Shoulder Instructions       General Comments       Pertinent Vitals/ Pain       Pain Assessment Pain Assessment: Faces Faces Pain Scale: Hurts a little bit Pain Location: lower back Pain Descriptors / Indicators: Grimacing, Guarding Pain Intervention(s): Monitored during session  Home Living                                          Prior Functioning/Environment              Frequency  Min 2X/week        Progress Toward Goals  OT Goals(current goals can now be found in the care plan section)  Progress towards OT goals: Progressing toward goals  Acute Rehab OT Goals Patient Stated Goal: be able to get home OT Goal Formulation: With patient/family Time For Goal Achievement: 07/16/21 Potential to Achieve Goals: Good ADL Goals Pt Will Perform Lower Body Dressing: with min guard assist;sit to/from stand;sitting/lateral leans Pt Will Transfer to Toilet: with min guard assist;ambulating Additional ADL Goal #1: Pt to demo ability to manage TLSO brace Independently to maximize adherence to back precautions Additional ADL Goal #2: Pt to verbalize at least 3 fall prevention strategies to implement in home environment.  Plan Discharge plan remains appropriate    Co-evaluation                 AM-PAC OT "6 Clicks" Daily Activity     Outcome Measure   Help from another person eating meals?: None Help from another person taking care of personal grooming?:  A Little Help from another person toileting, which includes using toliet, bedpan, or urinal?: A Lot Help from another person bathing (including washing, rinsing, drying)?: A Lot Help from another person to put on and taking off regular upper body clothing?: A Lot Help from another person to put on and taking off regular lower body clothing?: A Lot 6 Click Score: 15    End of Session Equipment Utilized During Treatment: Gait belt;Rolling walker (2 wheels);Back brace  OT Visit Diagnosis: Unsteadiness on feet (R26.81);Other abnormalities of gait  and mobility (R26.89);Muscle weakness (generalized) (M62.81);History of falling (Z91.81);Repeated falls (R29.6)   Activity Tolerance Patient tolerated treatment well   Patient Left in chair;with call bell/phone within reach;with chair alarm set;with family/visitor present   Nurse Communication Mobility status        Time: 7902-4097 OT Time Calculation (min): 35 min  Charges: OT General Charges $OT Visit: 1 Visit OT Treatments $Self Care/Home Management : 23-37 mins  Malachy Chamber, OTR/L Acute Rehab Services Office: [phone]   Layla Maw 07/08/2021, 11:19 AM

## 2021-07-09 ENCOUNTER — Encounter (HOSPITAL_COMMUNITY): Payer: Self-pay | Admitting: Neurosurgery

## 2021-07-09 DIAGNOSIS — M48062 Spinal stenosis, lumbar region with neurogenic claudication: Secondary | ICD-10-CM | POA: Diagnosis not present

## 2021-07-09 LAB — CBC
HCT: 21.6 % — ABNORMAL LOW (ref 39.0–52.0)
Hemoglobin: 7.1 g/dL — ABNORMAL LOW (ref 13.0–17.0)
MCH: 30.1 pg (ref 26.0–34.0)
MCHC: 32.9 g/dL (ref 30.0–36.0)
MCV: 91.5 fL (ref 80.0–100.0)
Platelets: 300 10*3/uL (ref 150–400)
RBC: 2.36 MIL/uL — ABNORMAL LOW (ref 4.22–5.81)
RDW: 13.6 % (ref 11.5–15.5)
WBC: 9.6 10*3/uL (ref 4.0–10.5)
nRBC: 0 % (ref 0.0–0.2)

## 2021-07-09 LAB — BASIC METABOLIC PANEL
Anion gap: 9 (ref 5–15)
BUN: 78 mg/dL — ABNORMAL HIGH (ref 8–23)
CO2: 21 mmol/L — ABNORMAL LOW (ref 22–32)
Calcium: 8.2 mg/dL — ABNORMAL LOW (ref 8.9–10.3)
Chloride: 110 mmol/L (ref 98–111)
Creatinine, Ser: 5.56 mg/dL — ABNORMAL HIGH (ref 0.61–1.24)
GFR, Estimated: 10 mL/min — ABNORMAL LOW (ref >60)
Glucose, Bld: 111 mg/dL — ABNORMAL HIGH (ref 70–99)
Potassium: 4.1 mmol/L (ref 3.5–5.1)
Sodium: 140 mmol/L (ref 135–145)

## 2021-07-09 LAB — PREPARE RBC (CROSSMATCH)

## 2021-07-09 LAB — PARATHYROID HORMONE, INTACT (NO CA): PTH: 149 pg/mL — ABNORMAL HIGH (ref 15–65)

## 2021-07-09 MED ORDER — LORAZEPAM 1 MG PO TABS
1.0000 mg | ORAL_TABLET | Freq: Once | ORAL | Status: AC
  Administered 2021-07-09: 1 mg via ORAL
  Filled 2021-07-09: qty 1

## 2021-07-09 NOTE — Progress Notes (Signed)
I&O cathed for 680 ml.  Tolerated well.

## 2021-07-09 NOTE — Progress Notes (Signed)
Resting in bed and less agitated and calmer.  No longer combative.  Did get an order for a tele sitter to attempt to help remind him to stay in bed.

## 2021-07-09 NOTE — Progress Notes (Signed)
Physical Therapy Treatment Patient Details Name: Chase Evans MRN: [id number] DOB: 1940-09-18 Today's Date: 07/09/2021   History of Present Illness Pt is an 81 y/o male who presented for L1-2 fusion with extension to T10 in setting of spinal stenosis. PMH: BPH, CKD, HTN, TIA, hx of back sx.    PT Comments    Pt seated in recliner on arrival.  He required min assistance to mobilize.  Pt with poor safety.  Pt continues to benefit from acute rehab stay.      Recommendations for follow up therapy are one component of a multi-disciplinary discharge planning process, led by the attending physician.  Recommendations may be updated based on patient status, additional functional criteria and insurance authorization.  Follow Up Recommendations  Acute inpatient rehab (3hours/day)     Assistance Recommended at Discharge Frequent or constant Supervision/Assistance  Patient can return home with the following Two people to help with walking and/or transfers;Two people to help with bathing/dressing/bathroom;Direct supervision/assist for medications management;Direct supervision/assist for financial management;Assistance with cooking/housework;Assist for transportation;Help with stairs or ramp for entrance   Equipment Recommendations  Rolling walker (2 wheels);BSC/3in1    Recommendations for Other Services       Precautions / Restrictions Precautions Precautions: Fall;Back Precaution Booklet Issued: Yes (comment) Precaution Comments: pt with no recall, pt re-educated on back precautions but again no recall at end of session Required Braces or Orthoses: Spinal Brace Spinal Brace: Thoracolumbosacral orthotic;Applied in sitting position Restrictions Weight Bearing Restrictions: No     Mobility  Bed Mobility               General bed mobility comments: Pt seated in recliner on arrival.  Brace is riding up in recliner and required readjustment pre tx.    Transfers   Equipment  used: Rolling walker (2 wheels) Transfers: Sit to/from Stand Sit to Stand: Min assist           General transfer comment: Cues for hand placement to and from seated surface.  Able to move to edge of recliner with max VCs.    Ambulation/Gait Ambulation/Gait assistance: Min assist Gait Distance (Feet): 120 Feet Assistive device: Rolling walker (2 wheels) Gait Pattern/deviations: Decreased step length - right, Decreased step length - left, Decreased stride length, Decreased weight shift to right, Decreased weight shift to left, Knee flexed in stance - left, Knee flexed in stance - right, Trunk flexed, Narrow base of support, Step-through pattern Gait velocity: decreased     General Gait Details: Cues for posture and position in RW.  pt required cues for forward gaze and hand placement on RW.  Cues to extend B Knees.  Pt with poor turns this session stepping outside of device.   Stairs             Wheelchair Mobility    Modified Rankin (Stroke Patients Only)       Balance Overall balance assessment: Needs assistance Sitting-balance support: No upper extremity supported, Feet supported Sitting balance-Leahy Scale: Fair Sitting balance - Comments: posterior bias with dynamic tasks of donning socks sitting EOB Postural control: Posterior lean   Standing balance-Leahy Scale: Poor Standing balance comment: pt dependent on external support for safe standing                            Cognition Arousal/Alertness: Awake/alert Behavior During Therapy: WFL for tasks assessed/performed, Impulsive Overall Cognitive Status: Impaired/Different from baseline Area of Impairment: Attention, Memory, Following commands, Safety/judgement,  Awareness, Problem solving, Orientation                 Orientation Level: Disoriented to, Situation Current Attention Level: Selective Memory: Decreased short-term memory Following Commands: Follows one step commands with  increased time Safety/Judgement: Decreased awareness of safety, Decreased awareness of deficits Awareness: Emergent Problem Solving: Difficulty sequencing, Requires verbal cues, Requires tactile cues, Decreased initiation, Slow processing General Comments: Pt more alert this session and tolerated increased activity.        Exercises      General Comments        Pertinent Vitals/Pain Pain Assessment Pain Assessment: Faces Pain Score: 3  Pain Descriptors / Indicators: Grimacing, Guarding Pain Intervention(s): Monitored during session, Repositioned    Home Living                          Prior Function            PT Goals (current goals can now be found in the care plan section) Acute Rehab PT Goals Patient Stated Goal: did not state Potential to Achieve Goals: Fair Progress towards PT goals: Progressing toward goals    Frequency    Min 5X/week      PT Plan Current plan remains appropriate    Co-evaluation              AM-PAC PT "6 Clicks" Mobility   Outcome Measure  Help needed turning from your back to your side while in a flat bed without using bedrails?: A Lot Help needed moving from lying on your back to sitting on the side of a flat bed without using bedrails?: A Lot Help needed moving to and from a bed to a chair (including a wheelchair)?: A Lot Help needed standing up from a chair using your arms (e.g., wheelchair or bedside chair)?: A Lot Help needed to walk in hospital room?: A Lot Help needed climbing 3-5 steps with a railing? : A Lot 6 Click Score: 12    End of Session Equipment Utilized During Treatment: Gait belt;Back brace Activity Tolerance: Patient tolerated treatment well Patient left: with call bell/phone within reach;with family/visitor present;in chair;with chair alarm set Nurse Communication: Mobility status PT Visit Diagnosis: Unsteadiness on feet (R26.81);Muscle weakness (generalized) (M62.81);Difficulty in walking,  not elsewhere classified (R26.2);Repeated falls (R29.6);History of falling (Z91.81);Other abnormalities of gait and mobility (R26.89)     Time: 4742-5956 PT Time Calculation (min) (ACUTE ONLY): 16 min  Charges:  $Gait Training: 8-22 mins                     Bonney Leitz , PTA Acute Rehabilitation Services  Office [phone]    Koula Venier Artis Delay 07/09/2021, 1:21 PM

## 2021-07-10 ENCOUNTER — Inpatient Hospital Stay (HOSPITAL_COMMUNITY)
Admission: RE | Admit: 2021-07-10 | Discharge: 2021-07-21 | DRG: 560 | Disposition: A | Discharge status: Home Health | Payer: Medicare Other | Source: Intra-hospital | Attending: Physical Medicine and Rehabilitation | Admitting: Physical Medicine and Rehabilitation

## 2021-07-10 ENCOUNTER — Encounter (HOSPITAL_COMMUNITY): Payer: Self-pay | Admitting: Physical Medicine and Rehabilitation

## 2021-07-10 ENCOUNTER — Other Ambulatory Visit: Payer: Self-pay

## 2021-07-10 DIAGNOSIS — I120 Hypertensive chronic kidney disease with stage 5 chronic kidney disease or end stage renal disease: Secondary | ICD-10-CM | POA: Diagnosis present

## 2021-07-10 DIAGNOSIS — R338 Other retention of urine: Secondary | ICD-10-CM | POA: Diagnosis present

## 2021-07-10 DIAGNOSIS — Z981 Arthrodesis status: Secondary | ICD-10-CM | POA: Diagnosis not present

## 2021-07-10 DIAGNOSIS — Z79899 Other long term (current) drug therapy: Secondary | ICD-10-CM

## 2021-07-10 DIAGNOSIS — K592 Neurogenic bowel, not elsewhere classified: Secondary | ICD-10-CM | POA: Diagnosis present

## 2021-07-10 DIAGNOSIS — Z87891 Personal history of nicotine dependence: Secondary | ICD-10-CM

## 2021-07-10 DIAGNOSIS — F05 Delirium due to known physiological condition: Secondary | ICD-10-CM | POA: Diagnosis present

## 2021-07-10 DIAGNOSIS — Z888 Allergy status to other drugs, medicaments and biological substances status: Secondary | ICD-10-CM

## 2021-07-10 DIAGNOSIS — M5106 Intervertebral disc disorders with myelopathy, lumbar region: Secondary | ICD-10-CM | POA: Diagnosis not present

## 2021-07-10 DIAGNOSIS — F0390 Unspecified dementia without behavioral disturbance: Secondary | ICD-10-CM | POA: Diagnosis present

## 2021-07-10 DIAGNOSIS — M1990 Unspecified osteoarthritis, unspecified site: Secondary | ICD-10-CM | POA: Diagnosis present

## 2021-07-10 DIAGNOSIS — Z885 Allergy status to narcotic agent status: Secondary | ICD-10-CM | POA: Diagnosis not present

## 2021-07-10 DIAGNOSIS — E440 Moderate protein-calorie malnutrition: Secondary | ICD-10-CM | POA: Diagnosis not present

## 2021-07-10 DIAGNOSIS — E876 Hypokalemia: Secondary | ICD-10-CM | POA: Diagnosis not present

## 2021-07-10 DIAGNOSIS — D72829 Elevated white blood cell count, unspecified: Secondary | ICD-10-CM

## 2021-07-10 DIAGNOSIS — M48062 Spinal stenosis, lumbar region with neurogenic claudication: Secondary | ICD-10-CM | POA: Diagnosis not present

## 2021-07-10 DIAGNOSIS — Z8249 Family history of ischemic heart disease and other diseases of the circulatory system: Secondary | ICD-10-CM

## 2021-07-10 DIAGNOSIS — N185 Chronic kidney disease, stage 5: Secondary | ICD-10-CM | POA: Diagnosis present

## 2021-07-10 DIAGNOSIS — Z880 Allergy status to penicillin: Secondary | ICD-10-CM

## 2021-07-10 DIAGNOSIS — E785 Hyperlipidemia, unspecified: Secondary | ICD-10-CM | POA: Diagnosis present

## 2021-07-10 DIAGNOSIS — M4716 Other spondylosis with myelopathy, lumbar region: Secondary | ICD-10-CM

## 2021-07-10 DIAGNOSIS — R000 Tachycardia, unspecified: Secondary | ICD-10-CM | POA: Diagnosis not present

## 2021-07-10 DIAGNOSIS — N319 Neuromuscular dysfunction of bladder, unspecified: Secondary | ICD-10-CM | POA: Diagnosis present

## 2021-07-10 DIAGNOSIS — Z7902 Long term (current) use of antithrombotics/antiplatelets: Secondary | ICD-10-CM | POA: Diagnosis not present

## 2021-07-10 DIAGNOSIS — Z8673 Personal history of transient ischemic attack (TIA), and cerebral infarction without residual deficits: Secondary | ICD-10-CM

## 2021-07-10 DIAGNOSIS — N179 Acute kidney failure, unspecified: Secondary | ICD-10-CM | POA: Diagnosis present

## 2021-07-10 DIAGNOSIS — N401 Enlarged prostate with lower urinary tract symptoms: Secondary | ICD-10-CM | POA: Diagnosis present

## 2021-07-10 DIAGNOSIS — Z4789 Encounter for other orthopedic aftercare: Principal | ICD-10-CM

## 2021-07-10 DIAGNOSIS — E639 Nutritional deficiency, unspecified: Secondary | ICD-10-CM

## 2021-07-10 DIAGNOSIS — D62 Acute posthemorrhagic anemia: Secondary | ICD-10-CM | POA: Diagnosis present

## 2021-07-10 DIAGNOSIS — M7989 Other specified soft tissue disorders: Secondary | ICD-10-CM | POA: Diagnosis not present

## 2021-07-10 DIAGNOSIS — I10 Essential (primary) hypertension: Secondary | ICD-10-CM | POA: Diagnosis not present

## 2021-07-10 DIAGNOSIS — G4733 Obstructive sleep apnea (adult) (pediatric): Secondary | ICD-10-CM | POA: Diagnosis present

## 2021-07-10 DIAGNOSIS — R339 Retention of urine, unspecified: Secondary | ICD-10-CM | POA: Diagnosis not present

## 2021-07-10 LAB — BASIC METABOLIC PANEL
Anion gap: 13 (ref 5–15)
BUN: 72 mg/dL — ABNORMAL HIGH (ref 8–23)
CO2: 20 mmol/L — ABNORMAL LOW (ref 22–32)
Calcium: 8.5 mg/dL — ABNORMAL LOW (ref 8.9–10.3)
Chloride: 108 mmol/L (ref 98–111)
Creatinine, Ser: 5.52 mg/dL — ABNORMAL HIGH (ref 0.61–1.24)
GFR, Estimated: 10 mL/min — ABNORMAL LOW (ref >60)
Glucose, Bld: 96 mg/dL (ref 70–99)
Potassium: 3.5 mmol/L (ref 3.5–5.1)
Sodium: 141 mmol/L (ref 135–145)

## 2021-07-10 LAB — CBC
HCT: 28.5 % — ABNORMAL LOW (ref 39.0–52.0)
Hemoglobin: 9.5 g/dL — ABNORMAL LOW (ref 13.0–17.0)
MCH: 30 pg (ref 26.0–34.0)
MCHC: 33.3 g/dL (ref 30.0–36.0)
MCV: 89.9 fL (ref 80.0–100.0)
Platelets: 331 10*3/uL (ref 150–400)
RBC: 3.17 MIL/uL — ABNORMAL LOW (ref 4.22–5.81)
RDW: 13.9 % (ref 11.5–15.5)
WBC: 12.2 10*3/uL — ABNORMAL HIGH (ref 4.0–10.5)
nRBC: 0 % (ref 0.0–0.2)

## 2021-07-10 LAB — BPAM RBC
Blood Product Expiration Date: 202307182359
Blood Product Expiration Date: 202307182359
ISSUE DATE / TIME: 202306231250
ISSUE DATE / TIME: 202306231527
Unit Type and Rh: 6200
Unit Type and Rh: 6200

## 2021-07-10 LAB — CULTURE, BLOOD (ROUTINE X 2)
Culture: NO GROWTH
Culture: NO GROWTH
Special Requests: ADEQUATE
Special Requests: ADEQUATE

## 2021-07-10 LAB — TYPE AND SCREEN
ABO/RH(D): A POS
Antibody Screen: NEGATIVE
Unit division: 0
Unit division: 0

## 2021-07-10 MED ORDER — SENNOSIDES-DOCUSATE SODIUM 8.6-50 MG PO TABS
1.0000 | ORAL_TABLET | Freq: Every day | ORAL | Status: DC
Start: 2021-07-11 — End: 2021-07-21
  Administered 2021-07-11 – 2021-07-21 (×11): 1 via ORAL
  Filled 2021-07-10 (×11): qty 1

## 2021-07-10 MED ORDER — FERROUS SULFATE 325 (65 FE) MG PO TABS: 325.0000 mg | ORAL_TABLET | Freq: Two times a day (BID) | ORAL | 3 refills | Status: DC

## 2021-07-10 MED ORDER — TAMSULOSIN HCL 0.4 MG PO CAPS
0.8000 mg | ORAL_CAPSULE | Freq: Every day | ORAL | Status: DC
  Administered 2021-07-11 – 2021-07-20 (×10): 0.8 mg via ORAL
  Filled 2021-07-10 (×10): qty 2

## 2021-07-10 MED ORDER — SODIUM CHLORIDE 0.9 % IV SOLN
250.0000 mg | Freq: Every day | INTRAVENOUS | Status: AC
  Administered 2021-07-11 – 2021-07-12 (×2): 250 mg via INTRAVENOUS
  Filled 2021-07-10 (×2): qty 20

## 2021-07-10 MED ORDER — PROCHLORPERAZINE EDISYLATE 10 MG/2ML IJ SOLN: 5.0000 mg | Freq: Four times a day (QID) | INTRAMUSCULAR | Status: DC | PRN

## 2021-07-10 MED ORDER — CYCLOBENZAPRINE HCL 10 MG PO TABS
10.0000 mg | ORAL_TABLET | Freq: Three times a day (TID) | ORAL | Status: DC | PRN
Start: 2021-07-10 — End: 2021-07-10

## 2021-07-10 MED ORDER — MELATONIN 3 MG PO TABS
3.0000 mg | ORAL_TABLET | Freq: Every day | ORAL | Status: DC
  Administered 2021-07-10 – 2021-07-20 (×11): 3 mg via ORAL
  Filled 2021-07-10 (×11): qty 1

## 2021-07-10 MED ORDER — PROCHLORPERAZINE 25 MG RE SUPP: 12.5000 mg | Freq: Four times a day (QID) | RECTAL | Status: DC | PRN

## 2021-07-10 MED ORDER — SODIUM CHLORIDE 0.9 % IV SOLN
250.0000 mg | Freq: Every day | INTRAVENOUS | Status: DC
  Filled 2021-07-10 (×2): qty 20

## 2021-07-10 MED ORDER — PHENOL 1.4 % MT LIQD
1.0000 | OROMUCOSAL | Status: DC | PRN
Start: 2021-07-10 — End: 2021-07-21

## 2021-07-10 MED ORDER — FLEET ENEMA 7-19 GM/118ML RE ENEM: 1.0000 | ENEMA | Freq: Once | RECTAL | Status: DC | PRN

## 2021-07-10 MED ORDER — BISACODYL 10 MG RE SUPP: 10.0000 mg | Freq: Every day | RECTAL | Status: DC

## 2021-07-10 MED ORDER — ALLOPURINOL 100 MG PO TABS
100.0000 mg | ORAL_TABLET | Freq: Every day | ORAL | Status: DC
  Administered 2021-07-11 – 2021-07-21 (×11): 100 mg via ORAL
  Filled 2021-07-10 (×11): qty 1

## 2021-07-10 MED ORDER — TRAZODONE HCL 50 MG PO TABS
25.0000 mg | ORAL_TABLET | Freq: Every evening | ORAL | Status: DC | PRN
  Administered 2021-07-11 – 2021-07-12 (×2): 50 mg via ORAL
  Filled 2021-07-10 (×2): qty 1

## 2021-07-10 MED ORDER — GUAIFENESIN-DM 100-10 MG/5ML PO SYRP: 5.0000 mL | ORAL_SOLUTION | Freq: Four times a day (QID) | ORAL | Status: DC | PRN

## 2021-07-10 MED ORDER — DOCUSATE SODIUM 100 MG PO CAPS
100.0000 mg | ORAL_CAPSULE | Freq: Two times a day (BID) | ORAL | Status: DC
  Administered 2021-07-10 – 2021-07-21 (×21): 100 mg via ORAL
  Filled 2021-07-10 (×21): qty 1

## 2021-07-10 MED ORDER — DARBEPOETIN ALFA 40 MCG/0.4ML IJ SOSY
40.0000 ug | PREFILLED_SYRINGE | INTRAMUSCULAR | Status: DC
Start: 2021-07-15 — End: 2021-07-21

## 2021-07-10 MED ORDER — PROCHLORPERAZINE MALEATE 5 MG PO TABS: 5.0000 mg | ORAL_TABLET | Freq: Four times a day (QID) | ORAL | Status: DC | PRN

## 2021-07-10 MED ORDER — DIPHENHYDRAMINE HCL 12.5 MG/5ML PO ELIX: 12.5000 mg | ORAL_SOLUTION | Freq: Four times a day (QID) | ORAL | Status: DC | PRN

## 2021-07-10 MED ORDER — ORAL CARE MOUTH RINSE: 15.0000 mL | OROMUCOSAL | Status: DC | PRN

## 2021-07-10 MED ORDER — ATORVASTATIN CALCIUM 10 MG PO TABS
10.0000 mg | ORAL_TABLET | Freq: Every day | ORAL | Status: DC
  Administered 2021-07-10 – 2021-07-20 (×11): 10 mg via ORAL
  Filled 2021-07-10 (×11): qty 1

## 2021-07-10 MED ORDER — BISACODYL 10 MG RE SUPP
10.0000 mg | Freq: Every day | RECTAL | Status: DC
  Administered 2021-07-10 – 2021-07-15 (×4): 10 mg via RECTAL
  Filled 2021-07-10 (×5): qty 1

## 2021-07-10 MED ORDER — POLYETHYLENE GLYCOL 3350 17 G PO PACK: 17.0000 g | PACK | Freq: Every day | ORAL | Status: DC | PRN

## 2021-07-10 MED ORDER — AMLODIPINE BESYLATE 2.5 MG PO TABS
2.5000 mg | ORAL_TABLET | Freq: Every day | ORAL | Status: DC
  Administered 2021-07-11: 2.5 mg via ORAL
  Filled 2021-07-10: qty 1

## 2021-07-10 MED ORDER — MENTHOL 3 MG MT LOZG: 1.0000 | LOZENGE | OROMUCOSAL | Status: DC | PRN

## 2021-07-10 MED ORDER — ACETAMINOPHEN 325 MG PO TABS: 325.0000 mg | ORAL_TABLET | ORAL | Status: DC | PRN

## 2021-07-10 MED ORDER — DARBEPOETIN ALFA 40 MCG/0.4ML IJ SOSY
40.0000 ug | PREFILLED_SYRINGE | INTRAMUSCULAR | Status: DC
  Administered 2021-07-15: 40 ug via SUBCUTANEOUS
  Filled 2021-07-10 (×2): qty 0.4

## 2021-07-10 MED ORDER — FERROUS SULFATE 325 (65 FE) MG PO TABS
325.0000 mg | ORAL_TABLET | Freq: Two times a day (BID) | ORAL | Status: DC
  Administered 2021-07-10 – 2021-07-21 (×22): 325 mg via ORAL
  Filled 2021-07-10 (×21): qty 1

## 2021-07-10 MED ORDER — AMLODIPINE BESYLATE 2.5 MG PO TABS: 2.5000 mg | ORAL_TABLET | Freq: Every day | ORAL | Status: DC

## 2021-07-10 MED ORDER — MELATONIN 3 MG PO TABS: 3.0000 mg | ORAL_TABLET | Freq: Every day | ORAL | 0 refills | Status: DC

## 2021-07-10 MED ORDER — ALUM & MAG HYDROXIDE-SIMETH 200-200-20 MG/5ML PO SUSP: 30.0000 mL | ORAL | Status: DC | PRN

## 2021-07-11 DIAGNOSIS — M5106 Intervertebral disc disorders with myelopathy, lumbar region: Secondary | ICD-10-CM

## 2021-07-11 MED ORDER — AMLODIPINE BESYLATE 5 MG PO TABS
5.0000 mg | ORAL_TABLET | Freq: Every day | ORAL | Status: DC
  Administered 2021-07-12 – 2021-07-21 (×10): 5 mg via ORAL
  Filled 2021-07-11 (×10): qty 1

## 2021-07-11 MED ORDER — CLOPIDOGREL BISULFATE 75 MG PO TABS
75.0000 mg | ORAL_TABLET | Freq: Every day | ORAL | Status: DC
  Administered 2021-07-11 – 2021-07-21 (×11): 75 mg via ORAL
  Filled 2021-07-11 (×11): qty 1

## 2021-07-11 NOTE — Evaluation (Signed)
Occupational Therapy Assessment and Plan  Patient Details  Name: Chase Evans MRN: [id number] Date of Birth: 05-Jul-1940  OT Diagnosis: abnormal posture, acute pain, blindness and low vision, cognitive deficits, and muscle weakness (generalized) Rehab Potential: Rehab Potential (ACUTE ONLY): Good ELOS: 7-10 days   Today's Date: 07/11/2021 OT Individual Time: 1300-1415 OT Individual Time Calculation (min): 75 min     Hospital Problem: Principal Problem:   Lumbar disc disorder with myelopathy   Past Medical History:  Past Medical History:  Diagnosis Date   Arthritis    BPH (benign prostatic hyperplasia)    Delirium with dementia    whenever hospitalized   Dementia (HCC)    Family history of anesthesia complication    mother N/V   Hyperlipemia    Hypertension    Sleep apnea    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HCC)    TIA (transient ischemic attack)    Past Surgical History:  Past Surgical History:  Procedure Laterality Date   BACK SURGERY  05/12/08   CERVICAL DISC SURGERY      Assessment & Plan Clinical Impression: Chase Evans. [name] is an 81 year old male with history of BPH, OSA, CKD 5, dementia with h/o sundowning with hospitalizations, LBP radiating to LE and neurogenic claudication due to L1/L2 stenosis. He was was admitted on 07/01/21 for L1-L2 decompression with T10-L2 fusion by Dr. Lovell Sheehan. Post op course significant for agitation due to delirium and intolerance of ativan, ABLA as well as aute on chronic renal failure.  Marland Kitchen He was to be febrile on 06/18 with leucocytosis and BC negative. He found to have urinary retention on 06/20  and has been requiring I/O cath for 700- 1100 cc. He had rise in SCr to 6.08 on 06/20 and Dr. Signe Colt recommended I/O caths as well as increase in fluid intake and if unable to do so, small NS boluses recommended.    Acute on chronic anemia treated with IV iron X 4 as well as small dose Aranesp. On 06/23, he had progressive drop in hgb to  7.1 requiring one unit PRBC as well as foley due to ongoing retention with high volumes. Palliative care consulted for GOC and family elected on full code. Safety has been an issue with unwitnessed fall on 06/19-->no injuries reported. Sleep wake disruption reported with intermittent confusion ongoing with combativeness and attempts out of bed at nights. Therapy has been working with patient who continues to be limited by weakness, poor posture, inability to recall back precautions, impulsivity and poor safety awareness. CIR recommended due to functional decline.    Patient transferred to CIR on 07/10/2021 .    Patient currently requires max with basic self-care skills secondary to muscle weakness, decreased cardiorespiratoy endurance, impaired timing and sequencing, decreased visual acuity, decreased initiation, decreased problem solving, decreased safety awareness, decreased memory, and delayed processing, and decreased sitting balance, decreased standing balance, decreased postural control, decreased balance strategies, and difficulty maintaining precautions.  Prior to hospitalization, patient could complete ADLs and IADLs Independent-Mod I.  Patient will benefit from skilled intervention to increase independence with basic self-care skills prior to discharge home with care partner.  Anticipate patient will require 24 hour supervision and no further OT follow recommended.  OT - End of Session Activity Tolerance: Tolerates 30+ min activity with multiple rests Endurance Deficit: Yes Endurance Deficit Description: rest breaks required during functional tasks OT Assessment Rehab Potential (ACUTE ONLY): Good OT Barriers to Discharge: Inaccessible home environment OT Patient demonstrates impairments in the  following area(s): Balance;Cognition;Endurance;Motor;Sensory;Safety;Pain OT Basic ADL's Functional Problem(s): Grooming;Bathing;Dressing;Toileting OT Advanced ADL's Functional Problem(s): Simple Meal  Preparation;Light Housekeeping OT Transfers Functional Problem(s): Tub/Shower;Toilet OT Plan OT Intensity: Minimum of 1-2 x/day, 45 to 90 minutes OT Frequency: 5 out of 7 days OT Duration/Estimated Length of Stay: 7-10 days OT Treatment/Interventions: Balance/vestibular training;Neuromuscular re-education;Patient/family education;Self Care/advanced ADL retraining;Splinting/orthotics;Therapeutic Exercise;UE/LE Coordination activities;Discharge planning;DME/adaptive equipment instruction;Functional mobility training;Pain management;Psychosocial support;Therapeutic Activities;UE/LE Strength taining/ROM OT Self Feeding Anticipated Outcome(s): Independent OT Basic Self-Care Anticipated Outcome(s): Supervision OT Toileting Anticipated Outcome(s): Supervision OT Bathroom Transfers Anticipated Outcome(s): Supervision OT Recommendation Patient destination: Home Follow Up Recommendations: 24 hour supervision/assistance Equipment Recommended: Other (comment) Equipment Details: hip kit   OT Evaluation Precautions/Restrictions  Precautions Precautions: Fall;Back Precaution Booklet Issued: Yes (comment) Precaution Comments: reviewed BLT Required Braces or Orthoses: Spinal Brace Spinal Brace: Thoracolumbosacral orthotic;Applied in sitting position Restrictions Weight Bearing Restrictions: No General Chart Reviewed: Yes Vital Signs Therapy Vitals Temp: 97.8 F (36.6 C) Pulse Rate: (!) 109 Resp: 16 BP: (!) 159/92 Patient Position (if appropriate): Sitting Oxygen Therapy SpO2: 99 % O2 Device: Room Air Pain Pain Assessment Pain Scale: 0-10 Pain Score: 2  Pain Type: Acute pain;Surgical pain Pain Location: Back Pain Orientation: Mid;Lower Pain Descriptors / Indicators: Aching Pain Onset: On-going Patients Stated Pain Goal: 0 Pain Intervention(s): Relaxation;Repositioned Home Living/Prior Functioning Home Living Family/patient expects to be discharged to:: Private residence Living  Arrangements: Alone Available Help at Discharge: Family, Available 24 hours/day Type of Home: House Home Access: Stairs to enter Secretary/administrator of Steps: 4 Entrance Stairs-Rails: Right Home Layout: One level Bathroom Shower/Tub: Health visitor: Handicapped height Bathroom Accessibility: Yes Additional Comments: Family plan to take pt to their home in [place] after rehab (per chart)  Lives With: Alone IADL History Homemaking Responsibilities: Yes Meal Prep Responsibility: Primary Laundry Responsibility: Primary Cleaning Responsibility: Primary Bill Paying/Finance Responsibility: Primary Prior Function Level of Independence: Independent with gait, Independent with transfers, Requires assistive device for independence, Independent with basic ADLs, Independent with homemaking with ambulation (SPC occasionally)  Able to Take Stairs?: Yes Driving: Yes Vocation: Retired Administrator, sports Baseline Vision/History: 1 Wears glasses;6 Macular Degeneration Ability to See in Adequate Light: 1 Impaired Patient Visual Report: No change from baseline Vision Assessment?: No apparent visual deficits Perception  Perception: Within Functional Limits Praxis Praxis: Intact Cognition Cognition Overall Cognitive Status: Impaired/Different from baseline Arousal/Alertness: Awake/alert Orientation Level: Person Memory: Impaired Memory Impairment: Decreased short term memory;Decreased recall of new information Attention: Focused;Sustained;Selective Focused Attention: Appears intact Sustained Attention: Appears intact Selective Attention: Impaired Awareness: Impaired Awareness Impairment: Emergent impairment Problem Solving: Impaired Problem Solving Impairment: Functional basic Executive Function: Sequencing Sequencing: Impaired Sequencing Impairment: Functional basic Safety/Judgment: Impaired Brief Interview for Mental Status (BIMS) Repetition of Three Words (First Attempt):  1 Temporal Orientation: Year: Missed by more than 5 years Temporal Orientation: Month: Accurate within 5 days Temporal Orientation: Day: Incorrect Recall: "Sock": Yes, no cue required Recall: "Blue": Yes, no cue required Recall: "Bed": No, could not recall BIMS Summary Score: 7 Sensation Sensation Light Touch: Appears Intact (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) Proprioception: Appears Intact Stereognosis: Not tested Coordination Gross Motor Movements are Fluid and Coordinated: No Fine Motor Movements are Fluid and Coordinated: Yes Coordination and Movement Description: impaired 2/2 generalized weakness Motor  Motor Motor: Abnormal postural alignment and control Motor - Skilled Clinical Observations: impaired secondary to generalized weakness  Trunk/Postural Assessment  Cervical Assessment Cervical Assessment: Exceptions to [facility] (forward head) Thoracic Assessment Thoracic Assessment: Exceptions to WFL (TLSO, back precautions) Lumbar  Assessment Lumbar Assessment: Exceptions to WFL (TLSO, back precautions) Postural Control Postural Control: Deficits on evaluation Righting Reactions: delayed  Balance Balance Balance Assessed: Yes Static Sitting Balance Static Sitting - Balance Support: No upper extremity supported;Feet supported Static Sitting - Level of Assistance: 5: Stand by assistance Dynamic Sitting Balance Dynamic Sitting - Balance Support: No upper extremity supported;Feet supported;During functional activity Dynamic Sitting - Level of Assistance: 4: Min assist Dynamic Sitting - Balance Activities: Reaching for objects Static Standing Balance Static Standing - Balance Support: Bilateral upper extremity supported;During functional activity Static Standing - Level of Assistance: 4: Min assist Dynamic Standing Balance Dynamic Standing - Balance Support: Bilateral upper extremity supported;During functional activity Dynamic Standing - Level of Assistance: 4: Min  assist Dynamic Standing - Balance Activities: Reaching for objects Extremity/Trunk Assessment RUE Assessment RUE Assessment: Exceptions to [facility] Active Range of Motion (AROM) Comments: ~120 General Strength Comments: 3+/5 grip strength LUE Assessment LUE Assessment: Exceptions to [facility] Active Range of Motion (AROM) Comments: ~120 General Strength Comments: 3/5 grip strength  Care Tool Care Tool Self Care Eating   Eating Assist Level: Set up assist    Oral Care    Oral Care Assist Level: Supervision/Verbal cueing    Bathing   Body parts bathed by patient: Right arm;Left arm;Chest;Abdomen;Front perineal area;Left upper leg;Right upper leg;Face Body parts bathed by helper: Buttocks;Right lower leg;Left lower leg   Assist Level: Minimal Assistance - Patient > 75%    Upper Body Dressing(including orthotics)   What is the patient wearing?: Pull over shirt;Orthosis Orthosis activity level: Performed by helper Assist Level: Moderate Assistance - Patient 50 - 74%    Lower Body Dressing (excluding footwear)   What is the patient wearing?: Pants;Underwear/pull up Assist for lower body dressing: Moderate Assistance - Patient 50 - 74%    Putting on/Taking off footwear   What is the patient wearing?: Socks Assist for footwear: Maximal Assistance - Patient 25 - 49%       Care Tool Toileting Toileting activity Toileting Activity did not occur (Clothing management and hygiene only): N/A (no void or bm) Assist for toileting: Moderate Assistance - Patient 50 - 74% (anticipated level based on observed performance)     Care Tool Bed Mobility Roll left and right activity Roll left and right activity did not occur: N/A      Sit to lying activity Sit to lying activity did not occur: N/A      Lying to sitting on side of bed activity Lying to sitting on side of bed activity did not occur: the ability to move from lying on the back to sitting on the side of the bed with no back support.: N/A        Care Tool Transfers Sit to stand transfer   Sit to stand assist level: Minimal Assistance - Patient > 75%    Chair/bed transfer   Chair/bed transfer assist level: Minimal Assistance - Patient > 75%     Toilet transfer   Assist Level: Minimal Assistance - Patient > 75%     Care Tool Cognition  Expression of Ideas and Wants Expression of Ideas and Wants: 3. Some difficulty - exhibits some difficulty with expressing needs and ideas (e.g, some words or finishing thoughts) or speech is not clear  Understanding Verbal and Non-Verbal Content Understanding Verbal and Non-Verbal Content: 3. Usually understands - understands most conversations, but misses some part/intent of message. Requires cues at times to understand   Memory/Recall Ability Memory/Recall Ability : None of  the above were recalled   Refer to Care Plan for Long Term Goals  SHORT TERM GOAL WEEK 1 OT Short Term Goal 1 (Week 1): Pt to be Min A for LB bathing/dressing with AE as needed and LRD OT Short Term Goal 2 (Week 1): Pt to be Min A for UB dressing OT Short Term Goal 3 (Week 1): Pt to be Min A for toileting with LRD OT Short Term Goal 4 (Week 1): Pt to be CGA for functional transfers with LRD OT Short Term Goal 5 (Week 1): Pt to improve UE strength to 4/5 to complete functional transfers  Recommendations for other services: None    Skilled Therapeutic Intervention ADL ADL Eating: Set up Grooming: Supervision/safety Where Assessed-Grooming: Sitting at sink Upper Body Bathing: Minimal cueing;Supervision/safety Where Assessed-Upper Body Bathing: Sitting at sink Lower Body Bathing: Moderate assistance;Minimal cueing Where Assessed-Lower Body Bathing: Sitting at sink Upper Body Dressing: Moderate assistance Where Assessed-Upper Body Dressing: Sitting at sink Lower Body Dressing: Maximal assistance Where Assessed-Lower Body Dressing: Sitting at sink;Standing at sink Toileting: Unable to assess (anticipate Mod A  based on observed performance) Toilet Transfer: Minimal assistance Toilet Transfer Method: Proofreader: Acupuncturist: Insurance underwriter Method: Designer, industrial/product: Grab bars;Shower seat with back Mobility  Transfers Sit to Stand: Minimal Assistance - Patient > 75% Stand to Sit: Minimal Assistance - Patient > 75%  OT evaluation initiated. Educated on role of OT, plan of care, discharge recommendations, AE recommendations, and spinal precautions. Pt completes ADL tasks at the levels stated below. Pt limited by decreased balance, cognition, safety, endurance, and weakness and would benefit from continued OT services to achieve highest level of independence.   Discharge Criteria: Patient will be discharged from OT if patient refuses treatment 3 consecutive times without medical reason, if treatment goals not met, if there is a change in medical status, if patient makes no progress towards goals or if patient is discharged from hospital.  The above assessment, treatment plan, treatment alternatives and goals were discussed and mutually agreed upon: by patient  Carolin Sicks 07/11/2021, 4:52 PM

## 2021-07-11 NOTE — Progress Notes (Signed)
PROGRESS NOTE   Subjective/Complaints:  Alert and appropriate this am , family at bedside  ROS- neg CP, SOB, N/V/D Objective:   No results found. Recent Labs    07/09/21 0050 07/10/21 0219  WBC 9.6 12.2*  HGB 7.1* 9.5*  HCT 21.6* 28.5*  PLT 300 331   Recent Labs    07/09/21 0050 07/10/21 0219  NA 140 141  K 4.1 3.5  CL 110 108  CO2 21* 20*  GLUCOSE 111* 96  BUN 78* 72*  CREATININE 5.56* 5.52*  CALCIUM 8.2* 8.5*    Intake/Output Summary (Last 24 hours) at 07/11/2021 1042 Last data filed at 07/11/2021 1610 Gross per 24 hour  Intake --  Output 850 ml  Net -850 ml        Physical Exam: Vital Signs Blood pressure (!) 174/86, pulse 98, temperature 98 F (36.7 C), resp. rate 16, SpO2 96 %.   General: No acute distress Mood and affect are appropriate Heart: Regular rate and rhythm no rubs murmurs or extra sounds Lungs: Clear to auscultation, breathing unlabored, no rales or wheezes Abdomen: Positive bowel sounds, soft nontender to palpation, nondistended Extremities: No clubbing, cyanosis, or edema Skin: No evidence of breakdown, no evidence of rash Neurologic: Cranial nerves II through XII intact, motor strength is 5/5 in bilateral deltoid, bicep, tricep, grip, 4/5 hip flexor, knee extensors, ankle dorsiflexor and plantar flexor Sensory exam normal sensation to light touch  in bilateral upper and lower extremities  Musculoskeletal: in TLSO to limit spine ROM    Assessment/Plan: 1. Functional deficits which require 3+ hours per day of interdisciplinary therapy in a comprehensive inpatient rehab setting. Physiatrist is providing close team supervision and 24 hour management of active medical problems listed below. Physiatrist and rehab team continue to assess barriers to discharge/monitor patient progress toward functional and medical goals  Care Tool:  Bathing              Bathing assist        Upper Body Dressing/Undressing Upper body dressing        Upper body assist      Lower Body Dressing/Undressing Lower body dressing            Lower body assist       Toileting Toileting    Toileting assist       Transfers Chair/bed transfer  Transfers assist           Locomotion Ambulation   Ambulation assist              Walk 10 feet activity   Assist           Walk 50 feet activity   Assist           Walk 150 feet activity   Assist           Walk 10 feet on uneven surface  activity   Assist           Wheelchair     Assist               Wheelchair 50 feet with 2 turns activity    Assist  Wheelchair 150 feet activity     Assist          Blood pressure (!) 174/86, pulse 98, temperature 98 F (36.7 C), resp. rate 16, SpO2 96 %.  Medical Problem List and Plan: 1. 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  Antithrombotics: -DVT/anticoagulation:  Mechanical: Sequential compression devices, below knee Bilateral lower extremities             -antiplatelet therapy: N/a 3. Pain Management: Tylenol prn, stop cyclobenzaprine 4. Mood/Sleep: Monitor sleep wake cycle with sleep chart.             --LCSW to follow for evaluation and support when appropriate.              -antipsychotic agents: none  5. Neuropsych/cognition: This patient is not capable of making decisions on his own behalf. 6. Skin/Wound Care: Monitor back incision for healing. Routine pressure relief measures. 7. Fluids/Electrolytes/Nutrition: Strict I/O. Encourage fluid intake             -foley for now 8. Acute blood loss anemia: Monitor H/H with serial checks on oral FeSO4             --Supplement with IV iron x 2 more days  and weekly aranesp 9. Acute on chronic renal failure: Monitor renal  status             --avoid nephrotoxic drugs. IVF boluses prn?             --needs strict I/O 10. BPH/Neurogenic bladder: Foley to help bladder decompress for a few days.             --Increase Flomax to 0.8mg /hs             --voiding trial early next week 11. Neurogenic bowel: Has had 2 documented BM since admission?             --Last BM after suppository. Will start daily suppository after supper.  12. HTN: Monitor BP TID--continue low dose Norvasc Vitals:   07/10/21 1930 07/11/21 0525  BP: (!) 164/81 (!) 174/86  Pulse: (!) 101 98  Resp: 16 16  Temp: 98.2 F (36.8 C) 98 F (36.7 C)  SpO2: 96% 96%   Elevated systolic will increase norvasc     LOS: 1 days A FACE TO FACE EVALUATION WAS PERFORMED  Erick Colace 07/11/2021, 10:42 AM

## 2021-07-12 LAB — CBC WITH DIFFERENTIAL/PLATELET
Abs Immature Granulocytes: 0.63 10*3/uL — ABNORMAL HIGH (ref 0.00–0.07)
Basophils Absolute: 0.1 10*3/uL (ref 0.0–0.1)
Basophils Relative: 1 %
Eosinophils Absolute: 0.7 10*3/uL — ABNORMAL HIGH (ref 0.0–0.5)
Eosinophils Relative: 4 %
HCT: 31.3 % — ABNORMAL LOW (ref 39.0–52.0)
Hemoglobin: 10.6 g/dL — ABNORMAL LOW (ref 13.0–17.0)
Immature Granulocytes: 4 %
Lymphocytes Relative: 10 %
Lymphs Abs: 1.4 10*3/uL (ref 0.7–4.0)
MCH: 30.5 pg (ref 26.0–34.0)
MCHC: 33.9 g/dL (ref 30.0–36.0)
MCV: 90.2 fL (ref 80.0–100.0)
Monocytes Absolute: 1 10*3/uL (ref 0.1–1.0)
Monocytes Relative: 7 %
Neutro Abs: 11 10*3/uL — ABNORMAL HIGH (ref 1.7–7.7)
Neutrophils Relative %: 74 %
Platelets: 404 10*3/uL — ABNORMAL HIGH (ref 150–400)
RBC: 3.47 MIL/uL — ABNORMAL LOW (ref 4.22–5.81)
RDW: 13.7 % (ref 11.5–15.5)
WBC: 14.9 10*3/uL — ABNORMAL HIGH (ref 4.0–10.5)
nRBC: 0.1 % (ref 0.0–0.2)

## 2021-07-12 LAB — COMPREHENSIVE METABOLIC PANEL
ALT: 23 U/L (ref 0–44)
AST: 23 U/L (ref 15–41)
Albumin: 2.6 g/dL — ABNORMAL LOW (ref 3.5–5.0)
Alkaline Phosphatase: 46 U/L (ref 38–126)
Anion gap: 13 (ref 5–15)
BUN: 67 mg/dL — ABNORMAL HIGH (ref 8–23)
CO2: 19 mmol/L — ABNORMAL LOW (ref 22–32)
Calcium: 8.8 mg/dL — ABNORMAL LOW (ref 8.9–10.3)
Chloride: 108 mmol/L (ref 98–111)
Creatinine, Ser: 5.28 mg/dL — ABNORMAL HIGH (ref 0.61–1.24)
GFR, Estimated: 10 mL/min — ABNORMAL LOW (ref >60)
Glucose, Bld: 103 mg/dL — ABNORMAL HIGH (ref 70–99)
Potassium: 3.7 mmol/L (ref 3.5–5.1)
Sodium: 140 mmol/L (ref 135–145)
Total Bilirubin: 0.9 mg/dL (ref 0.3–1.2)
Total Protein: 5.5 g/dL — ABNORMAL LOW (ref 6.5–8.1)

## 2021-07-12 LAB — URINALYSIS, ROUTINE W REFLEX MICROSCOPIC
Bilirubin Urine: NEGATIVE
Glucose, UA: 50 mg/dL — AB
Ketones, ur: 5 mg/dL — AB
Leukocytes,Ua: NEGATIVE
Nitrite: NEGATIVE
Protein, ur: >=300 mg/dL — AB
Specific Gravity, Urine: 1.016 (ref 1.005–1.030)
pH: 5 (ref 5.0–8.0)

## 2021-07-12 MED ORDER — SODIUM CHLORIDE 0.9 % IV SOLN: INTRAVENOUS | Status: DC | PRN

## 2021-07-12 NOTE — Progress Notes (Signed)
Occupational Therapy Session Note  Patient Details  Name: Chase Evans MRN: [id number] Date of Birth: 1940/12/14  Today's Date: 07/12/2021 OT Individual Time: 1100-1155 OT Individual Time Calculation (min): 55 min    Short Term Goals: Week 1:  OT Short Term Goal 1 (Week 1): Pt to be Min A for LB bathing/dressing with AE as needed and LRD OT Short Term Goal 2 (Week 1): Pt to be Min A for UB dressing OT Short Term Goal 3 (Week 1): Pt to be Min A for toileting with LRD OT Short Term Goal 4 (Week 1): Pt to be CGA for functional transfers with LRD OT Short Term Goal 5 (Week 1): Pt to improve UE strength to 4/5 to complete functional transfers  Skilled Therapeutic Interventions/Progress Updates:    Pt sitting in w/c upon arrival, attempting to pull the belt alarm box closer. Pt stated he doesn't like it. Pt with IV running. Pt unable to describe why he was in hospital. Pt unable to communicate discharge plan other then he wanted to go home. Pt with increasing tangential conversation and unable to complete single thoughts. Sit<>stand X 6 with min A and max verbal cues for sequencing. Pt required max verbal cues to attend to tasks. Did not engage pt in ADLs 2/2 IV running and pt unable to consistently follow one step commands regarding safety. Pt remained in w/c with belt alarm activated and all needs within reach.   Therapy Documentation Precautions:  Precautions Precautions: Fall, Back Precaution Booklet Issued: Yes (comment) Precaution Comments: reviewed BLT Required Braces or Orthoses: Spinal Brace Spinal Brace: Thoracolumbosacral orthotic, Applied in sitting position Restrictions Weight Bearing Restrictions: No  Pain: Pain Assessment Pain Scale: 0-10 Pain Score: 0-No pain   Therapy/Group: Individual Therapy  Rich Brave 07/12/2021, 12:04 PM

## 2021-07-12 NOTE — Progress Notes (Signed)
PROGRESS NOTE   Subjective/Complaints:   Pt reports he thinks his LBM was 4-5 days ago- is actually 2 days ago per chart.  Pt was confused but pleasant overnight- pulled out his foley. Pt admits ot hurting in back only with specific movements.  Pt said pullin gout foley was accident- per nurse, was playing with it a lot.  Also did bowel program last night and no results and couldn't feel any stool.   Hasn't voided yet since foley pulled out ~ 5am.   WBC 14.9- up from 12- also has left shift.   ROS- impaired by cognition Objective:   No results found. Recent Labs    07/10/21 0219 07/12/21 0543  WBC 12.2* 14.9*  HGB 9.5* 10.6*  HCT 28.5* 31.3*  PLT 331 404*   Recent Labs    07/10/21 0219 07/12/21 0543  NA 141 140  K 3.5 3.7  CL 108 108  CO2 20* 19*  GLUCOSE 96 103*  BUN 72* 67*  CREATININE 5.52* 5.28*  CALCIUM 8.5* 8.8*    Intake/Output Summary (Last 24 hours) at 07/12/2021 2130 Last data filed at 07/12/2021 0934 Gross per 24 hour  Intake 700 ml  Output 1100 ml  Net -400 ml        Physical Exam: Vital Signs Blood pressure (!) 169/91, pulse 96, temperature 98.2 F (36.8 C), temperature source Oral, resp. rate 17, SpO2 97 %.    General: awake, alert, calm; very little spontaneous speech; NAD HENT: conjugate gaze; oropharynx moist CV: regular to borderline elevated/tachycardic  rate; no JVD Pulmonary: CTA B/L; no W/R/R- good air movement GI: soft, NT, ND, (+)BS- hypoactive BS Psychiatric: appropriate- but very flat, and answering with short answers- appears somewhat confused, but pleasant.  Neurological: alert, but poor memory- somewhat confused on timing of things esp- doesn't remember pulling out foley.  Neurologic: Cranial nerves II through XII intact, motor strength is 5/5 in bilateral deltoid, bicep, tricep, grip, 4/5 hip flexor, knee extensors, ankle dorsiflexor and plantar flexor Sensory exam  normal sensation to light touch  in bilateral upper and lower extremities  Musculoskeletal: in TLSO when up to limit spine ROM    Assessment/Plan: 1. Functional deficits which require 3+ hours per day of interdisciplinary therapy in a comprehensive inpatient rehab setting. Physiatrist is providing close team supervision and 24 hour management of active medical problems listed below. Physiatrist and rehab team continue to assess barriers to discharge/monitor patient progress toward functional and medical goals  Care Tool:  Bathing    Body parts bathed by patient: Right arm, Left arm, Chest, Abdomen, Front perineal area, Left upper leg, Right upper leg, Face   Body parts bathed by helper: Buttocks, Right lower leg, Left lower leg     Bathing assist Assist Level: Minimal Assistance - Patient > 75%     Upper Body Dressing/Undressing Upper body dressing   What is the patient wearing?: Pull over shirt, Orthosis Orthosis activity level: Performed by helper  Upper body assist Assist Level: Moderate Assistance - Patient 50 - 74%    Lower Body Dressing/Undressing Lower body dressing      What is the patient wearing?: Pants, Underwear/pull up  Lower body assist Assist for lower body dressing: Moderate Assistance - Patient 50 - 74%     Toileting Toileting Toileting Activity did not occur (Clothing management and hygiene only): N/A (no void or bm)  Toileting assist Assist for toileting: Moderate Assistance - Patient 50 - 74% (anticipated level based on observed performance)     Transfers Chair/bed transfer  Transfers assist     Chair/bed transfer assist level: Minimal Assistance - Patient > 75%     Locomotion Ambulation   Ambulation assist      Assist level: Minimal Assistance - Patient > 75% Assistive device: Walker-rolling Max distance: 150'   Walk 10 feet activity   Assist     Assist level: Minimal Assistance - Patient > 75% Assistive device:  Walker-rolling   Walk 50 feet activity   Assist    Assist level: Minimal Assistance - Patient > 75% Assistive device: Walker-rolling    Walk 150 feet activity   Assist    Assist level: Minimal Assistance - Patient > 75% Assistive device: Walker-rolling    Walk 10 feet on uneven surface  activity   Assist Walk 10 feet on uneven surfaces activity did not occur: Safety/medical concerns         Wheelchair     Assist Is the patient using a wheelchair?: Yes Type of Wheelchair: Manual    Wheelchair assist level: Dependent - Patient 0% Max wheelchair distance: 150'    Wheelchair 50 feet with 2 turns activity    Assist        Assist Level: Dependent - Patient 0%   Wheelchair 150 feet activity     Assist      Assist Level: Dependent - Patient 0%   Blood pressure (!) 169/91, pulse 96, temperature 98.2 F (36.8 C), temperature source Oral, resp. rate 17, SpO2 97 %.  Medical Problem List and Plan: 1. 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  Antithrombotics: -DVT/anticoagulation:  Mechanical: Sequential compression devices, below knee Bilateral lower extremities             -antiplatelet therapy: N/a 3. Pain Management: Tylenol prn, stop cyclobenzaprine  6/26- usually controlled- con't regimen as it is-  4. Mood/Sleep: Monitor sleep wake cycle with sleep chart.             --LCSW to follow for evaluation and support when appropriate.              -antipsychotic agents: none  5. Neuropsych/cognition: This patient is not capable of making decisions on his own behalf. 6. Skin/Wound Care: Monitor back incision for healing. Routine pressure relief measures. 7. Fluids/Electrolytes/Nutrition: Strict I/O. Encourage fluid intake             -foley for now  6/26- pt pulled himself last night- will do I/o caths as  required 8. Acute blood loss anemia: Monitor H/H with serial checks on oral FeSO4             --Supplement with IV iron x 2 more days  and weekly aranesp 9. Acute on chronic renal failure: Monitor renal status             --avoid nephrotoxic drugs. IVF boluses prn?             --  needs strict I/O  6/26- Cr ~ 5.25- will recheck tomorrow, since probable UTI 10. BPH/Neurogenic bladder: Foley to help bladder decompress for a few days.             --Increase Flomax to 0.8mg /hs             --voiding trial early next week  6/26- pt pulled his foley out- will do I/o caths q6 hours base don bladder scans.  11. Neurogenic bowel: Has had 2 documented BM since admission?             --Last BM after suppository. Will start daily suppository after supper.  12. HTN: Monitor BP TID--continue low dose Norvasc Vitals:   07/11/21 1945 07/12/21 0606  BP: (!) 149/93 (!) 169/91  Pulse: 100 96  Resp: 18 17  Temp: 98 F (36.7 C) 98.2 F (36.8 C)  SpO2: 99% 97%   Elevated systolic will increase norvasc    6/26- BP still elevated, but got his first dose of increased dose this AM 13. Leukocytosis  6/26- WBC up from 12.2 to 14.9k- pt acting more confused- pulled foley overnight- will check U/A and Cx- by cath and if doesn't have UTI (I think he does due to neurogenic bladder issues) then will complete additional ID work up  I spent a total of  53  minutes on total care today- >50% coordination of care- due to calling charge nurse about u/a and Cx- talking with pt's nurse and PA about likely ID work up- if U/A negative since WBC 14.9- and making appropriate changes    LOS: 2 days A FACE TO FACE EVALUATION WAS PERFORMED  Chase Evans 07/12/2021, 9:38 AM

## 2021-07-13 ENCOUNTER — Inpatient Hospital Stay (HOSPITAL_COMMUNITY): Payer: Medicare Other

## 2021-07-13 DIAGNOSIS — M7989 Other specified soft tissue disorders: Secondary | ICD-10-CM | POA: Diagnosis not present

## 2021-07-13 LAB — CBC WITH DIFFERENTIAL/PLATELET
Abs Immature Granulocytes: 0.48 10*3/uL — ABNORMAL HIGH (ref 0.00–0.07)
Basophils Absolute: 0.1 10*3/uL (ref 0.0–0.1)
Basophils Relative: 1 %
Eosinophils Absolute: 0.7 10*3/uL — ABNORMAL HIGH (ref 0.0–0.5)
Eosinophils Relative: 5 %
HCT: 31 % — ABNORMAL LOW (ref 39.0–52.0)
Hemoglobin: 10.2 g/dL — ABNORMAL LOW (ref 13.0–17.0)
Immature Granulocytes: 3 %
Lymphocytes Relative: 9 %
Lymphs Abs: 1.4 10*3/uL (ref 0.7–4.0)
MCH: 29.9 pg (ref 26.0–34.0)
MCHC: 32.9 g/dL (ref 30.0–36.0)
MCV: 90.9 fL (ref 80.0–100.0)
Monocytes Absolute: 0.8 10*3/uL (ref 0.1–1.0)
Monocytes Relative: 5 %
Neutro Abs: 11.9 10*3/uL — ABNORMAL HIGH (ref 1.7–7.7)
Neutrophils Relative %: 77 %
Platelets: 411 10*3/uL — ABNORMAL HIGH (ref 150–400)
RBC: 3.41 MIL/uL — ABNORMAL LOW (ref 4.22–5.81)
RDW: 13.8 % (ref 11.5–15.5)
WBC: 15.3 10*3/uL — ABNORMAL HIGH (ref 4.0–10.5)
nRBC: 0 % (ref 0.0–0.2)

## 2021-07-13 LAB — BASIC METABOLIC PANEL
Anion gap: 12 (ref 5–15)
BUN: 62 mg/dL — ABNORMAL HIGH (ref 8–23)
CO2: 20 mmol/L — ABNORMAL LOW (ref 22–32)
Calcium: 8.7 mg/dL — ABNORMAL LOW (ref 8.9–10.3)
Chloride: 110 mmol/L (ref 98–111)
Creatinine, Ser: 5.1 mg/dL — ABNORMAL HIGH (ref 0.61–1.24)
GFR, Estimated: 11 mL/min — ABNORMAL LOW (ref >60)
Glucose, Bld: 95 mg/dL (ref 70–99)
Potassium: 3.7 mmol/L (ref 3.5–5.1)
Sodium: 142 mmol/L (ref 135–145)

## 2021-07-13 LAB — URINE CULTURE: Culture: NO GROWTH

## 2021-07-13 MED ORDER — TRAZODONE HCL 50 MG PO TABS
25.0000 mg | ORAL_TABLET | Freq: Every evening | ORAL | Status: DC | PRN
  Administered 2021-07-20: 25 mg via ORAL
  Filled 2021-07-13: qty 1

## 2021-07-13 MED ORDER — HYDRALAZINE HCL 10 MG PO TABS
10.0000 mg | ORAL_TABLET | Freq: Three times a day (TID) | ORAL | Status: DC
  Administered 2021-07-13 – 2021-07-15 (×6): 10 mg via ORAL
  Filled 2021-07-13 (×6): qty 1

## 2021-07-13 NOTE — Progress Notes (Addendum)
Occupational Therapy Note  Patient Details  Name: Chase Evans MRN: [id number] Date of Birth: [birth year]  Today's Date: 07/13/2021 OT Missed Time: 60 Minutes Missed Time Reason: Other (comment);Patient fatigue;X-Ray (pt with significant lethargy this morning)  Pt sleeping in bed upon arrival. Breakfast was untouched. Pt opened eyes slightly with initial interaction but did not open eyes for the remainder of interaction. Unable to arouse pt adequately to actively participate. Lab and portable xray waiting in hall to see pt. Pt missed 60 mins skilled OT services. Will attempt to see pt again as schedule allows.   Lavone Neri [facility] 07/13/2021, 9:23 AM

## 2021-07-13 NOTE — Progress Notes (Signed)
Physical Therapy Session Note  Patient Details  Name: Chase Evans MRN: [id number] Date of Birth: 1940/02/17  Today's Date: 07/13/2021 PT Individual Time: 1115-1200 PT Individual Time Calculation (min): 45 min   Short Term Goals: Week 1:  PT Short Term Goal 1 (Week 1): =LTG due to ELOS  Skilled Therapeutic Interventions/Progress Updates: Pt presented in bed sleeping but easily aroused. Pt initially denies pain but then states increased soreness when initiating mobility. Pt performed supine to sit via log roll and minA with mod multimodal cues. PTA threaded pants and donned TLSO total A for time management. Performed Sit to stand with CGA and RW and pulled pants over hips with minA. Pt noted to have smear of BM in brief therefore ambulated to bathroom with CGA and RW and performed toilet transfers with CGA. Pt did not have any additional void therefore pt stood and PTA performed peri-care to ensure cleanliness. Pt then ambulated to w/c and transported to day room. Participated in ambulation around 4W nsg station ~169ft with CGA and min cues for improved erect posture and maintaining close proximity to RW. Pt then participated in Sit to stand without AD for static balance and BLE strengthening. Participated in toe taps to 6in step initially with AD then progressed to [facility] for NMR with pt requiring increased time and support when lifting RLE. Participated in standing therex including hip abd and hamstring curls x 10 bilaterally. Pt ambulated back to room with RW and CGA at end of session and agreeable to sit in recliner for lunch. Pt left in recliner with belt alarm on, call bell within reach and needs met.      Therapy Documentation Precautions:  Precautions Precautions: Fall, Back Precaution Booklet Issued: Yes (comment) Precaution Comments: reviewed BLT Required Braces or Orthoses: Spinal Brace Spinal Brace: Thoracolumbosacral orthotic, Applied in sitting position Restrictions Weight  Bearing Restrictions: No General:   Vital Signs:  Pain: Pain Assessment Pain Scale: Faces Faces Pain Scale: No hurt Mobility:   Locomotion :    Trunk/Postural Assessment :    Balance:   Exercises:   Other Treatments:      Therapy/Group: Individual Therapy  Chase Evans 07/13/2021, 12:32 PM

## 2021-07-13 NOTE — Progress Notes (Signed)
Bilateral LE venous duplex study completed. Please see CV Proc for preliminary results.  Arda Daggs BS, RVT 07/13/2021 3:25 PM

## 2021-07-13 NOTE — Progress Notes (Signed)
PROGRESS NOTE   Subjective/Complaints:  Pt really sleepy this AM.  U/A was (-) for UTI WBC still 15.3k Doesn't feel ill, just tired.  CXR is (-) for pneumonia   ROS- impaired by cognition and sleepiness Objective:   DG Chest 1 View  Result Date: 07/13/2021 CLINICAL DATA:  Fever EXAM: CHEST  1 VIEW COMPARISON:  02/22/2017 FINDINGS: Heart size within normal limits. Low lung volumes. No focal airspace consolidation, pleural effusion, or pneumothorax. IMPRESSION: No active disease. Electronically Signed   By: Duanne Guess D.O.   On: 07/13/2021 08:51   Recent Labs    07/12/21 0543 07/13/21 0625  WBC 14.9* 15.3*  HGB 10.6* 10.2*  HCT 31.3* 31.0*  PLT 404* 411*   Recent Labs    07/12/21 0543 07/13/21 0625  NA 140 142  K 3.7 3.7  CL 108 110  CO2 19* 20*  GLUCOSE 103* 95  BUN 67* 62*  CREATININE 5.28* 5.10*  CALCIUM 8.8* 8.7*    Intake/Output Summary (Last 24 hours) at 07/13/2021 0859 Last data filed at 07/13/2021 0755 Gross per 24 hour  Intake 640.1 ml  Output 1502 ml  Net -861.9 ml        Physical Exam: Vital Signs Blood pressure (!) 172/84, pulse 97, temperature 98.1 F (36.7 C), resp. rate 18, height 5\' 7"  (1.702 m), weight 73.6 kg, SpO2 98 %.    General: sleepy but woke to stimuli; calm; NAD HENT: conjugate gaze; oropharynx moist CV: regular to borderline tachycardic  rate; no JVD Pulmonary: CTA B/L; no W/R/R- good air movement GI: soft, NT, ND, (+)BS Psychiatric: calm; pleasant; very sleepy Neurological: sleepy- hard to wake up- but not actively confused this AM- more  Neurologic: Cranial nerves II through XII intact, motor strength is 5/5 in bilateral deltoid, bicep, tricep, grip, 4/5 hip flexor, knee extensors, ankle dorsiflexor and plantar flexor Sensory exam normal sensation to light touch  in bilateral upper and lower extremities  Musculoskeletal: in TLSO when up to limit spine ROM     Assessment/Plan: 1. Functional deficits which require 3+ hours per day of interdisciplinary therapy in a comprehensive inpatient rehab setting. Physiatrist is providing close team supervision and 24 hour management of active medical problems listed below. Physiatrist and rehab team continue to assess barriers to discharge/monitor patient progress toward functional and medical goals  Care Tool:  Bathing    Body parts bathed by patient: Right arm, Left arm, Chest, Abdomen, Front perineal area, Left upper leg, Right upper leg, Face   Body parts bathed by helper: Buttocks, Right lower leg, Left lower leg     Bathing assist Assist Level: Minimal Assistance - Patient > 75%     Upper Body Dressing/Undressing Upper body dressing   What is the patient wearing?: Pull over shirt, Orthosis Orthosis activity level: Performed by helper  Upper body assist Assist Level: Moderate Assistance - Patient 50 - 74%    Lower Body Dressing/Undressing Lower body dressing      What is the patient wearing?: Pants, Underwear/pull up     Lower body assist Assist for lower body dressing: Moderate Assistance - Patient 50 - 74%     Financial trader  Activity did not occur (Clothing management and hygiene only): N/A (no void or bm)  Toileting assist Assist for toileting: Moderate Assistance - Patient 50 - 74% (anticipated level based on observed performance)     Transfers Chair/bed transfer  Transfers assist     Chair/bed transfer assist level: Minimal Assistance - Patient > 75%     Locomotion Ambulation   Ambulation assist      Assist level: Minimal Assistance - Patient > 75% Assistive device: Walker-rolling Max distance: 150'   Walk 10 feet activity   Assist     Assist level: Minimal Assistance - Patient > 75% Assistive device: Walker-rolling   Walk 50 feet activity   Assist    Assist level: Minimal Assistance - Patient > 75% Assistive device:  Walker-rolling    Walk 150 feet activity   Assist    Assist level: Minimal Assistance - Patient > 75% Assistive device: Walker-rolling    Walk 10 feet on uneven surface  activity   Assist Walk 10 feet on uneven surfaces activity did not occur: Safety/medical concerns         Wheelchair     Assist Is the patient using a wheelchair?: Yes Type of Wheelchair: Manual    Wheelchair assist level: Dependent - Patient 0% Max wheelchair distance: 150'    Wheelchair 50 feet with 2 turns activity    Assist        Assist Level: Dependent - Patient 0%   Wheelchair 150 feet activity     Assist      Assist Level: Dependent - Patient 0%   Blood pressure (!) 172/84, pulse 97, temperature 98.1 F (36.7 C), resp. rate 18, height 5\' 7"  (1.702 m), weight 73.6 kg, SpO2 98 %.  Medical Problem List and Plan: 1. 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  Antithrombotics: -DVT/anticoagulation:  Mechanical: Sequential compression devices, below knee Bilateral lower extremities             -antiplatelet therapy: N/a 3. Pain Management: Tylenol prn, stop cyclobenzaprine  6/26- usually controlled- con't regimen as it is-  4. Mood/Sleep: Monitor sleep wake cycle with sleep chart.             --LCSW to follow for evaluation and support when appropriate.              -antipsychotic agents: none  5. Neuropsych/cognition: This patient is not capable of making decisions on his own behalf. 6. Skin/Wound Care: Monitor back incision for healing. Routine pressure relief measures. 7. Fluids/Electrolytes/Nutrition: Strict I/O. Encourage fluid intake             -foley for now  6/26- pt pulled himself last night- will do I/o caths as required 8. Acute blood loss anemia: Monitor H/H with serial checks on  oral FeSO4             --Supplement with IV iron x 2 more days  and weekly aranesp 9. Acute on chronic renal failure: Monitor renal status             --avoid nephrotoxic drugs. IVF boluses prn?             --needs strict I/O  6/26- Cr ~  5.25- will recheck tomorrow, since probable UTI 10. BPH/Neurogenic bladder: Foley to help bladder decompress for a few days.             --Increase Flomax to 0.8mg /hs             --voiding trial early next week  6/26- pt pulled his foley out- will do I/o caths q6 hours base don bladder scans.   6/27- voiding some, but also needing some caths- con't regimen 11. Neurogenic bowel: Has had 2 documented BM since admission?             --Last BM after suppository. Will start daily suppository after supper.  12. HTN: Monitor BP TID--continue low dose Norvasc Vitals:   07/12/21 2012 07/13/21 0621  BP: (!) 180/91 (!) 172/84  Pulse: 100 97  Resp: 17 18  Temp: 98.3 F (36.8 C) 98.1 F (36.7 C)  SpO2: 99% 98%   Elevated systolic will increase norvasc    6/26- BP still elevated, but got his first dose of increased dose this AM  6/27- will give Hydralazine 10 mg TID for now- since BP in 170s-180s range- will monitor trend 13. Leukocytosis  6/26- WBC up from 12.2 to 14.9k- pt acting more confused- pulled foley overnight- will check U/A and Cx- by cath and if doesn't have UTI (I think he does due to neurogenic bladder issues) then will complete additional ID work up  6/27- no UTI and no pneumonia- blood Cx's and Dopplers pending. If dopplers (-), will need to start broad spectrum ABX- no fever, but very sleepy- vitals OK otherwise. . Will check labs daily for the next 3 days-   I spent a total of  52  minutes on total care today- >50% coordination of care- due to team conference and trying to do work up for WBC of 15.3-     LOS: 3 days A FACE TO FACE EVALUATION WAS PERFORMED  Custer Pimenta 07/13/2021, 8:59 AM

## 2021-07-13 NOTE — IPOC Note (Signed)
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  Hospital Problems: Principal Problem:   Lumbar disc disorder with myelopathy     Functional Problem List: Nursing Bowel, Edema, Endurance, Medication Management, Motor, Safety, Skin Integrity  PT Balance, Endurance, Pain, Safety  OT Balance, Cognition, Endurance, Motor, Sensory, Safety, Pain  SLP    TR         Basic ADL's: OT Grooming, Bathing, Dressing, Toileting     Advanced  ADL's: OT Simple Meal Preparation, Light Housekeeping     Transfers: PT Bed Mobility, Bed to Chair, Car, [employer], Floor  OT [employer], Technical brewer: PT Ambulation, Psychologist, prison and probation services, Stairs     Additional Impairments: OT    SLP        TR      Anticipated Outcomes Item Anticipated Outcome  Self Feeding Independent  Dispensing optician Transfers Supervision  Bowel/Bladder  supervision  Transfers  Supervision  Locomotion  Supervision at ambulatory level  Communication     Cognition     Pain  < 3  Safety/Judgment  supervision   Therapy Plan: PT Intensity: Minimum of 1-2 x/day ,45 to 90 minutes PT Frequency: 5 out of 7 days PT Duration Estimated Length of Stay: 7-10 days OT Intensity: Minimum of 1-2 x/day, 45 to 90 minutes OT Frequency: 5 out of 7 days OT Duration/Estimated Length of Stay: 7-10 days     Team Interventions: Nursing Interventions Patient/Family Education, Bowel Management, Disease Management/Prevention, Medication Management, Skin Care/Wound Management, Discharge Planning  PT interventions Ambulation/gait training, Balance/vestibular training, Cognitive remediation/compensation, Community reintegration, Discharge planning, Disease management/prevention, DME/adaptive equipment instruction, Functional mobility training,  Neuromuscular re-education, Pain management, Patient/family education, Psychosocial support, Stair training, Therapeutic Activities, Therapeutic Exercise, UE/LE Strength taining/ROM, UE/LE Coordination activities  OT Interventions Balance/vestibular training, Neuromuscular re-education, Patient/family education, Self Care/advanced ADL retraining, Splinting/orthotics, Therapeutic Exercise, UE/LE Coordination activities, Discharge planning, DME/adaptive equipment instruction, Functional mobility training, Pain management, Psychosocial support, Therapeutic Activities, UE/LE Strength taining/ROM  SLP Interventions    TR Interventions    SW/CM Interventions     Barriers to Discharge MD  Medical stability, IV antibiotics, Neurogenic bowel and bladder, Wound care, and Weight bearing restrictions  Nursing Decreased caregiver support, Home environment access/layout, Wound Care, Weight bearing restrictions 2 level, 5 steps, 2 rails. Bed/bath main level. Discharging to son and DIL home. Can provide 24/7 assist.  PT Decreased caregiver support, Home environment access/layout    OT Inaccessible home environment    SLP      SW       Team Discharge Planning: Destination: PT-Home ,OT- Home , SLP-  Projected Follow-up: PT-Home health PT, 24 hour supervision/assistance, OT-  24 hour supervision/assistance, SLP-  Projected Equipment Needs: PT-Rolling walker with 5" wheels, OT- Other (comment), SLP-  Equipment Details: PT- , OT-hip kit Patient/family involved in discharge planning: PT- Patient,  OT-Patient, Family member/caregiver, SLP-   MD ELOS: 7-10 days Medical Rehab Prognosis:  Good Assessment: The patient has been admitted for CIR therapies with the diagnosis of lumbar myelopathy s/p lumbar fusion. The team will be addressing functional mobility, strength, stamina, balance, safety, adaptive techniques and equipment, self-care, bowel and bladder mgt, patient and caregiver education, possible in/out  caths. Goals have been set at supervision. Anticipated discharge destination is home.        See Team  Conference Notes for weekly updates to the plan of care

## 2021-07-14 ENCOUNTER — Inpatient Hospital Stay (HOSPITAL_COMMUNITY): Payer: Medicare Other

## 2021-07-14 LAB — CBC WITH DIFFERENTIAL/PLATELET
Abs Immature Granulocytes: 0.48 10*3/uL — ABNORMAL HIGH (ref 0.00–0.07)
Basophils Absolute: 0.1 10*3/uL (ref 0.0–0.1)
Basophils Relative: 1 %
Eosinophils Absolute: 0.7 10*3/uL — ABNORMAL HIGH (ref 0.0–0.5)
Eosinophils Relative: 5 %
HCT: 30.7 % — ABNORMAL LOW (ref 39.0–52.0)
Hemoglobin: 10.3 g/dL — ABNORMAL LOW (ref 13.0–17.0)
Immature Granulocytes: 3 %
Lymphocytes Relative: 10 %
Lymphs Abs: 1.5 10*3/uL (ref 0.7–4.0)
MCH: 30.5 pg (ref 26.0–34.0)
MCHC: 33.6 g/dL (ref 30.0–36.0)
MCV: 90.8 fL (ref 80.0–100.0)
Monocytes Absolute: 0.8 10*3/uL (ref 0.1–1.0)
Monocytes Relative: 5 %
Neutro Abs: 11.6 10*3/uL — ABNORMAL HIGH (ref 1.7–7.7)
Neutrophils Relative %: 76 %
Platelets: 392 10*3/uL (ref 150–400)
RBC: 3.38 MIL/uL — ABNORMAL LOW (ref 4.22–5.81)
RDW: 13.9 % (ref 11.5–15.5)
WBC: 15.2 10*3/uL — ABNORMAL HIGH (ref 4.0–10.5)
nRBC: 0 % (ref 0.0–0.2)

## 2021-07-14 LAB — BASIC METABOLIC PANEL
Anion gap: 13 (ref 5–15)
BUN: 61 mg/dL — ABNORMAL HIGH (ref 8–23)
CO2: 19 mmol/L — ABNORMAL LOW (ref 22–32)
Calcium: 8.7 mg/dL — ABNORMAL LOW (ref 8.9–10.3)
Chloride: 109 mmol/L (ref 98–111)
Creatinine, Ser: 5.31 mg/dL — ABNORMAL HIGH (ref 0.61–1.24)
GFR, Estimated: 10 mL/min — ABNORMAL LOW (ref >60)
Glucose, Bld: 80 mg/dL (ref 70–99)
Potassium: 3.8 mmol/L (ref 3.5–5.1)
Sodium: 141 mmol/L (ref 135–145)

## 2021-07-14 LAB — LACTIC ACID, PLASMA: Lactic Acid, Venous: 0.6 mmol/L (ref 0.5–1.9)

## 2021-07-14 NOTE — Progress Notes (Signed)
PROGRESS NOTE   Subjective/Complaints:  Per staff, pt's LBM was yesterday afternoon. Required a cath at 5am.  Pt concerned about the cost of treatment and says his kids don't "have the money to have me treated and I'm not getting better"- Pt doesn't remember therapy from the last few days, nor physician either.  Hasn't eaten breakfast yet -    ROS- impaired by cognition  Objective:   CT HEAD WO CONTRAST (5MM)  Result Date: 07/14/2021 CLINICAL DATA:  Delirium.  Lumbar surgery 2 weeks ago. EXAM: CT HEAD WITHOUT CONTRAST TECHNIQUE: Contiguous axial images were obtained from the base of the skull through the vertex without intravenous contrast. RADIATION DOSE REDUCTION: This exam was performed according to the departmental dose-optimization program which includes automated exposure control, adjustment of the mA and/or kV according to patient size and/or use of iterative reconstruction technique. COMPARISON:  Head CT 01/06/2017 and MRI 01/13/2015 FINDINGS: Brain: There is no evidence of an acute cortically based infarct, intracranial hemorrhage, mass, midline shift, or extra-axial fluid collection. There are patchy hypodensities in the cerebral white matter bilaterally which have progressed and are nonspecific but compatible with moderately extensive chronic small vessel ischemic disease. Mild cerebral atrophy is within normal limits for age. Vascular: Calcified atherosclerosis at the skull base. No hyperdense vessel. Skull: No fracture or suspicious osseous lesion. Sinuses/Orbits: Visualized paranasal sinuses and mastoid air cells are clear. Bilateral cataract extraction. Other: None. IMPRESSION: 1. No evidence of acute intracranial abnormality. 2. Moderately extensive chronic small vessel ischemic disease, progressed from 2018. Electronically Signed   By: Logan Bores M.D.   On: 07/14/2021 12:08   VAS [place] LOWER EXTREMITY VENOUS (DVT)  Result  Date: 07/13/2021  Lower Venous DVT Study Patient Name:  [facility]  Date of Exam:   07/13/2021 Medical Rec #: [id number]              Accession #:    [id number] Date of Birth: 26-May-1940               Patient Gender: M Patient Age:   81 years Exam Location:  [facility] Procedure:      VAS [place] LOWER EXTREMITY VENOUS (DVT) Referring Phys: Lauraine Rinne --------------------------------------------------------------------------------  Indications: Swelling.  Performing Technologist: Bobetta Lime BS, RVT  Examination Guidelines: A complete evaluation includes B-mode imaging, spectral Doppler, color Doppler, and power Doppler as needed of all accessible portions of each vessel. Bilateral testing is considered an integral part of a complete examination. Limited examinations for reoccurring indications may be performed as noted. The reflux portion of the exam is performed with the patient in reverse Trendelenburg.  +---------+---------------+---------+-----------+----------+--------------+ RIGHT    CompressibilityPhasicitySpontaneityPropertiesThrombus Aging +---------+---------------+---------+-----------+----------+--------------+ CFV      Full           Yes      Yes                                 +---------+---------------+---------+-----------+----------+--------------+ SFJ      Full                                                        +---------+---------------+---------+-----------+----------+--------------+  FV Prox  Full                                                        +---------+---------------+---------+-----------+----------+--------------+ FV Mid   Full                                                        +---------+---------------+---------+-----------+----------+--------------+ FV DistalFull                                                        +---------+---------------+---------+-----------+----------+--------------+ PFV      Full                                                         +---------+---------------+---------+-----------+----------+--------------+ POP      Full           Yes      Yes                                 +---------+---------------+---------+-----------+----------+--------------+ PTV      Full                                                        +---------+---------------+---------+-----------+----------+--------------+ PERO     Full                                                        +---------+---------------+---------+-----------+----------+--------------+   +---------+---------------+---------+-----------+----------+--------------+ LEFT     CompressibilityPhasicitySpontaneityPropertiesThrombus Aging +---------+---------------+---------+-----------+----------+--------------+ CFV      Full           Yes      Yes                                 +---------+---------------+---------+-----------+----------+--------------+ SFJ      Full                                                        +---------+---------------+---------+-----------+----------+--------------+ FV Prox  Full                                                        +---------+---------------+---------+-----------+----------+--------------+  FV Mid   Full                                                        +---------+---------------+---------+-----------+----------+--------------+ FV DistalFull                                                        +---------+---------------+---------+-----------+----------+--------------+ PFV      Full                                                        +---------+---------------+---------+-----------+----------+--------------+ POP      Full           Yes      Yes                                 +---------+---------------+---------+-----------+----------+--------------+ PTV      Full                                                         +---------+---------------+---------+-----------+----------+--------------+ PERO     Full                                                        +---------+---------------+---------+-----------+----------+--------------+     Summary: BILATERAL: - No evidence of deep vein thrombosis seen in the lower extremities, bilaterally. -No evidence of popliteal cyst, bilaterally.   *See table(s) above for measurements and observations. Electronically signed by Harold Barban MD on 07/13/2021 at 10:47:49 PM.    Final    DG Chest 1 View  Result Date: 07/13/2021 CLINICAL DATA:  Fever EXAM: CHEST  1 VIEW COMPARISON:  02/22/2017 FINDINGS: Heart size within normal limits. Low lung volumes. No focal airspace consolidation, pleural effusion, or pneumothorax. IMPRESSION: No active disease. Electronically Signed   By: Davina Poke D.O.   On: 07/13/2021 08:51   Recent Labs    07/13/21 0625 07/14/21 0546  WBC 15.3* 15.2*  HGB 10.2* 10.3*  HCT 31.0* 30.7*  PLT 411* 392   Recent Labs    07/13/21 0625 07/14/21 0546  NA 142 141  K 3.7 3.8  CL 110 109  CO2 20* 19*  GLUCOSE 95 80  BUN 62* 61*  CREATININE 5.10* 5.31*  CALCIUM 8.7* 8.7*    Intake/Output Summary (Last 24 hours) at 07/14/2021 1551 Last data filed at 07/14/2021 1230 Gross per 24 hour  Intake 600 ml  Output 1000 ml  Net -400 ml        Physical Exam: Vital Signs Blood pressure (!) 169/86, pulse (!) 102, temperature 98.3 F (36.8 C), temperature source  Oral, resp. rate 18, height 5\' 7"  (1.702 m), weight 73.6 kg, SpO2 97 %.     General: awake, sitting up in bed; still somewhat confused,NAD HENT: conjugate gaze; oropharynx moist CV: regular to borderline tachycardic rate; no JVD Pulmonary: CTA B/L; no W/R/R- good air movement GI: soft, NT, ND, (+)BS Psychiatric: tearful- doesn't remember interviewer-  Neurological: Ox1- still either confused/delirium vs dementia-  Neurologic: Cranial nerves II through XII intact, motor  strength is 5/5 in bilateral deltoid, bicep, tricep, grip, 4/5 hip flexor, knee extensors, ankle dorsiflexor and plantar flexor Sensory exam normal sensation to light touch  in bilateral upper and lower extremities  Musculoskeletal: in TLSO when up to limit spine ROM    Assessment/Plan: 1. Functional deficits which require 3+ hours per day of interdisciplinary therapy in a comprehensive inpatient rehab setting. Physiatrist is providing close team supervision and 24 hour management of active medical problems listed below. Physiatrist and rehab team continue to assess barriers to discharge/monitor patient progress toward functional and medical goals  Care Tool:  Bathing    Body parts bathed by patient: Right arm, Left arm, Chest, Abdomen, Front perineal area, Left upper leg, Right upper leg, Face   Body parts bathed by helper: Buttocks, Right lower leg, Left lower leg     Bathing assist Assist Level: Minimal Assistance - Patient > 75%     Upper Body Dressing/Undressing Upper body dressing   What is the patient wearing?: Pull over shirt, Orthosis Orthosis activity level: Performed by helper  Upper body assist Assist Level: Minimal Assistance - Patient > 75%    Lower Body Dressing/Undressing Lower body dressing      What is the patient wearing?: Pants     Lower body assist Assist for lower body dressing: Minimal Assistance - Patient > 75%     Toileting Toileting Toileting Activity did not occur (Clothing management and hygiene only): N/A (no void or bm)  Toileting assist Assist for toileting: Moderate Assistance - Patient 50 - 74% (anticipated level based on observed performance)     Transfers Chair/bed transfer  Transfers assist     Chair/bed transfer assist level: Contact Guard/Touching assist     Locomotion Ambulation   Ambulation assist      Assist level: Contact Guard/Touching assist Assistive device: Walker-rolling Max distance: 150'   Walk 10 feet  activity   Assist     Assist level: Contact Guard/Touching assist Assistive device: Walker-rolling   Walk 50 feet activity   Assist    Assist level: Contact Guard/Touching assist Assistive device: Walker-rolling    Walk 150 feet activity   Assist    Assist level: Contact Guard/Touching assist Assistive device: Walker-rolling    Walk 10 feet on uneven surface  activity   Assist Walk 10 feet on uneven surfaces activity did not occur: Safety/medical concerns         Wheelchair     Assist Is the patient using a wheelchair?: Yes Type of Wheelchair: Manual    Wheelchair assist level: Dependent - Patient 0% Max wheelchair distance: 150'    Wheelchair 50 feet with 2 turns activity    Assist        Assist Level: Dependent - Patient 0%   Wheelchair 150 feet activity     Assist      Assist Level: Dependent - Patient 0%   Blood pressure (!) 169/86, pulse (!) 102, temperature 98.3 F (36.8 C), temperature source Oral, resp. rate 18, height 5\' 7"  (1.702 m), weight 73.6  kg, SpO2 97 %.  Medical Problem List and Plan: 1. 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   Con't CIR- PT, OT and SLP 2.  Antithrombotics: -DVT/anticoagulation:  Mechanical: Sequential compression devices, below knee Bilateral lower extremities             -antiplatelet therapy: N/a 3. Pain Management: Tylenol prn, stop cyclobenzaprine  6/26- usually controlled- con't regimen as it is-   6/28- taking tylenol- other opiates have been stopped 4. Mood/Sleep: Monitor sleep wake cycle with sleep chart.             --LCSW to follow for evaluation and support when appropriate.               -antipsychotic agents: none  5. Neuropsych/cognition: This patient is not capable of making decisions on his own behalf. 6. Skin/Wound Care: Monitor back incision for healing. Routine pressure relief measures. 7. Fluids/Electrolytes/Nutrition: Strict I/O. Encourage fluid intake             -foley for now  6/26- pt pulled himself last night- will do I/o caths as required  6/27- requiring in/out caths a few times per day- on max dose of Flomax 8. Acute blood loss anemia: Monitor H/H with serial checks on oral FeSO4             --Supplement with IV iron x 2 more days  and weekly aranesp 9. Acute on chronic renal failure: Monitor renal status             --avoid nephrotoxic drugs. IVF boluses prn?             --needs strict I/O  6/28- Cr pretty stable last few days- ~ 5-5.25-  10. BPH/Neurogenic bladder: Foley to help bladder decompress for a few days.             --Increase Flomax to 0.8mg /hs             --voiding trial early next week  6/26- pt pulled his foley out- will do I/o caths q6 hours base don bladder scans.   6/27- voiding some, but also needing some caths- con't regimen  6/28- maxxed out on Flomax -con't caths as required 11. Neurogenic bowel: Has had 2 documented BM since admission?             --Last BM after suppository. Will start daily suppository after supper.  12. HTN: Monitor BP TID--continue low dose Norvasc Vitals:   07/13/21 1935 07/14/21 0500  BP: (!) 172/97 (!) 169/86  Pulse: (!) 102 (!) 102  Resp: 18 18  Temp: 98.3 F (36.8 C) 98.3 F (36.8 C)  SpO2: 98% 97%   Elevated systolic will increase norvasc    6/26- BP still elevated, but got his first dose of increased dose this AM  6/27- will give Hydralazine 10 mg TID for now- since BP in 170s-180s range- will monitor trend  6/28- BP still 160s-170, but better- will con't for 24-48 hours before reducing dose 13. Leukocytosis  6/26- WBC up from 12.2 to 14.9k- pt acting more confused-  pulled foley overnight- will  check U/A and Cx- by cath and if doesn't have UTI (I think he does due to neurogenic bladder issues) then will complete additional ID work up  6/27- no UTI and no pneumonia- blood Cx's and Dopplers pending. If dopplers (-), will need to start broad spectrum ABX- no fever, but very sleepy- vitals OK otherwise. . Will check labs daily for the next 3 days-   6/28- WBC stable at 15k- Dopplers (-)- CXR and U/A (-)- blood Cxs are pending- but negative so far- did Head CT due to accompanying confusion- head Ct shows nothing acute- pt is afebrile and no other signs of infection- lactate 0.6- normal range- will con't daily labs and wait for blood Cx's-    I spent a total of 39   minutes on total care today- >50% coordination of care- due to head CT and d/w with PA about plans-    LOS: 4 days A FACE TO FACE EVALUATION WAS PERFORMED  Delando Satter 07/14/2021, 3:51 PM

## 2021-07-14 NOTE — Progress Notes (Signed)
Patient ID: Chase Evans, male   DOB: 01-20-1940, 81 y.o.   MRN: [id number]  SW received phone call for pt son Mehar Kirkwood confirming family edu on Monday (7/3) 9am-12pm with his wife and his brother.   Loralee Pacas, MSW, [place] Office: [phone] Cell: [phone] Fax: [phone]

## 2021-07-14 NOTE — Progress Notes (Signed)
Speech Language Pathology Daily Session Note  Patient Details  Name: Chase Evans MRN: [id number] Date of Birth: 07-30-1940  Today's Date: 07/14/2021 SLP Individual Time: 1000-1045 SLP Individual Time Calculation (min): 45 min  Short Term Goals: Week 1: SLP Short Term Goal 1 (Week 1): STG = LTG d/t ELOS  Skilled Therapeutic Interventions:Skilled ST services focused on cognitive skills. Pt was alert and orientated to self and place. SLP administered formal cognitive assessment SLUMS, pt scored 13/30 (n=>27) indicating primary deficits in attention and memory with secondary deficits in problem solving and awareness. Pt supports " I think I need to stay with my oldest son," but also supports " I can do my medication still on my own," demonstrated fluctuating impairment in intellectual awareness. Pt was left in room with call bell within reach and chair alarm set. SLP recommends to continue skilled services     Pain Pain Assessment Pain Score: 0-No pain  Therapy/Group: Individual Therapy  Ayron Fillinger  [facility] 07/14/2021, 2:37 PM

## 2021-07-14 NOTE — Progress Notes (Signed)
Occupational Therapy Session Note  Patient Details  Name: Chase Evans MRN: [id number] Date of Birth: August 03, 1940  Today's Date: 07/14/2021 OT Individual Time: 5366-4403 OT Individual Time Calculation (min): 71 min    Short Term Goals: Week 1:  OT Short Term Goal 1 (Week 1): Pt to be Min A for LB bathing/dressing with AE as needed and LRD OT Short Term Goal 2 (Week 1): Pt to be Min A for UB dressing OT Short Term Goal 3 (Week 1): Pt to be Min A for toileting with LRD OT Short Term Goal 4 (Week 1): Pt to be CGA for functional transfers with LRD OT Short Term Goal 5 (Week 1): Pt to improve UE strength to 4/5 to complete functional transfers  Skilled Therapeutic Interventions/Progress Updates:    Pt seated EOB upon arrival with RN present. RN reported that pt has been resistant to taking medications. Following ~5 mins in conversation, pt agreed to take his medications. RN reported that a total of ~20 mins were required for pt to take medicaitons. Pt agreeable to donning TLOS and pants. Dependent for TLSO and min A for donning pants. Sit<>stand with CGA. Standing balance with CGA. Pt amb with RW in room to sit in w/c and completed grooming tasks seated in w/c. Pt initiated and completed washing face and brushing teeth with supervision. Pt became tearful when talking about wife. Pt transitioned to gym for table tasks with focus on problem solving and task initiation. Pt constructed pipe tree structure with mod verbal cues for problem solving and sequencing. Pt returned to room and amb with RW to recliner. Pt remained in recliner with all needs within reach. Belt alarm activated.   Therapy Documentation Precautions:  Precautions Precautions: Fall, Back Precaution Booklet Issued: Yes (comment) Precaution Comments: reviewed BLT Required Braces or Orthoses: Spinal Brace Spinal Brace: Thoracolumbosacral orthotic, Applied in sitting position Restrictions Weight Bearing Restrictions:  No  Pain: Pt reports 0/10 pain this morning  Therapy/Group: Individual Therapy  Leroy Libman 07/14/2021, 9:27 AM

## 2021-07-14 NOTE — Progress Notes (Signed)
Physical Therapy Session Note  Patient Details  Name: Chase Evans MRN: [id number] Date of Birth: 11-08-1940  Today's Date: 07/14/2021 PT Individual Time: 7159-5396 PT Individual Time Calculation (min): 73 min   Short Term Goals: Week 1:  PT Short Term Goal 1 (Week 1): =LTG due to ELOS  Skilled Therapeutic Interventions/Progress Updates: Pt presented in bed agreeable to therapy. Pt denies pain at rest, c/o "soreness" with mobility but denies any additional intervention asides from rest. Pt performed supine to sit with minA for truncal support and to maintain spinal precaution. Pt noted to have brace on while in bed, PTA adjusted and straightened. PTA donned shoes maxA with pt able to place LE in figure four position but due to grip socks required assist to fully slide into place and tie shoes. Pt then ambulated to day room with RW and CGA with multimodal cues for improving erect posture and B foot clearance. Pt participated in various obstacle courses inclusing weaving through cones, stepping on compliant surface, stepping over hurdles and stepping over small thresholds. Pt able to perform activity with CGA overall but required mod multimodal cues for safe management of RW. Pt then participated in x 2 rounds of corn hole in standing without AD for balance challenge. Pt was able to perform reaching with moderate challenges while maintaining spinal precautions with CGA overall and no LOB. Pt then provided with reacher and ambulated to bags to pick up off lower surfaces. Pt required mod multimodal cues to maintain spinal precautions as x 2 occasions pt would attempt to bend vs performing mini squat to pick up bags off ground. After brief seated rest pt ambulated back to room with CGA and intermittent cues for posture and to clear B feet. Pt agreeable to sit in recliner after session. Pt left in recliner at end of session with belt alarm on, call bell within reach and needs met.          Therapy  Documentation Precautions:  Precautions Precautions: Fall, Back Precaution Booklet Issued: Yes (comment) Precaution Comments: reviewed BLT Required Braces or Orthoses: Spinal Brace Spinal Brace: Thoracolumbosacral orthotic, Applied in sitting position Restrictions Weight Bearing Restrictions: No General:   Vital Signs:  Pain: Pain Assessment Pain Score: 0-No pain Mobility:   Locomotion :    Trunk/Postural Assessment :    Balance:   Exercises:   Other Treatments:      Therapy/Group: Individual Therapy  Santhosh Gulino 07/14/2021, 4:05 PM

## 2021-07-15 LAB — CBC WITH DIFFERENTIAL/PLATELET
Abs Immature Granulocytes: 0.34 10*3/uL — ABNORMAL HIGH (ref 0.00–0.07)
Basophils Absolute: 0.1 10*3/uL (ref 0.0–0.1)
Basophils Relative: 1 %
Eosinophils Absolute: 0.8 10*3/uL — ABNORMAL HIGH (ref 0.0–0.5)
Eosinophils Relative: 5 %
HCT: 31.5 % — ABNORMAL LOW (ref 39.0–52.0)
Hemoglobin: 10.6 g/dL — ABNORMAL LOW (ref 13.0–17.0)
Immature Granulocytes: 2 %
Lymphocytes Relative: 10 %
Lymphs Abs: 1.5 10*3/uL (ref 0.7–4.0)
MCH: 30.6 pg (ref 26.0–34.0)
MCHC: 33.7 g/dL (ref 30.0–36.0)
MCV: 91 fL (ref 80.0–100.0)
Monocytes Absolute: 0.7 10*3/uL (ref 0.1–1.0)
Monocytes Relative: 5 %
Neutro Abs: 11.4 10*3/uL — ABNORMAL HIGH (ref 1.7–7.7)
Neutrophils Relative %: 77 %
Platelets: 381 10*3/uL (ref 150–400)
RBC: 3.46 MIL/uL — ABNORMAL LOW (ref 4.22–5.81)
RDW: 14 % (ref 11.5–15.5)
WBC: 14.9 10*3/uL — ABNORMAL HIGH (ref 4.0–10.5)
nRBC: 0 % (ref 0.0–0.2)

## 2021-07-15 LAB — BASIC METABOLIC PANEL
Anion gap: 14 (ref 5–15)
BUN: 59 mg/dL — ABNORMAL HIGH (ref 8–23)
CO2: 18 mmol/L — ABNORMAL LOW (ref 22–32)
Calcium: 8.5 mg/dL — ABNORMAL LOW (ref 8.9–10.3)
Chloride: 108 mmol/L (ref 98–111)
Creatinine, Ser: 5.23 mg/dL — ABNORMAL HIGH (ref 0.61–1.24)
GFR, Estimated: 10 mL/min — ABNORMAL LOW (ref >60)
Glucose, Bld: 81 mg/dL (ref 70–99)
Potassium: 3.6 mmol/L (ref 3.5–5.1)
Sodium: 140 mmol/L (ref 135–145)

## 2021-07-15 MED ORDER — HYDRALAZINE HCL 25 MG PO TABS
25.0000 mg | ORAL_TABLET | Freq: Three times a day (TID) | ORAL | Status: DC
  Administered 2021-07-15 – 2021-07-21 (×15): 25 mg via ORAL
  Filled 2021-07-15 (×19): qty 1

## 2021-07-15 NOTE — Progress Notes (Signed)
PROGRESS NOTE   Subjective/Complaints:  Pt says "not hungry"- and has eaten 0% of breakfast again today- asked staff to make sure he gets supplement.   LBM yesterday afternoon- medium sized. Dark.   ROS- impaired by cognition Objective:   CT HEAD WO CONTRAST (5MM)  Result Date: 07/14/2021 CLINICAL DATA:  Delirium.  Lumbar surgery 2 weeks ago. EXAM: CT HEAD WITHOUT CONTRAST TECHNIQUE: Contiguous axial images were obtained from the base of the skull through the vertex without intravenous contrast. RADIATION DOSE REDUCTION: This exam was performed according to the departmental dose-optimization program which includes automated exposure control, adjustment of the mA and/or kV according to patient size and/or use of iterative reconstruction technique. COMPARISON:  Head CT 01/06/2017 and MRI 01/13/2015 FINDINGS: Brain: There is no evidence of an acute cortically based infarct, intracranial hemorrhage, mass, midline shift, or extra-axial fluid collection. There are patchy hypodensities in the cerebral white matter bilaterally which have progressed and are nonspecific but compatible with moderately extensive chronic small vessel ischemic disease. Mild cerebral atrophy is within normal limits for age. Vascular: Calcified atherosclerosis at the skull base. No hyperdense vessel. Skull: No fracture or suspicious osseous lesion. Sinuses/Orbits: Visualized paranasal sinuses and mastoid air cells are clear. Bilateral cataract extraction. Other: None. IMPRESSION: 1. No evidence of acute intracranial abnormality. 2. Moderately extensive chronic small vessel ischemic disease, progressed from 2018. Electronically Signed   By: Logan Bores M.D.   On: 07/14/2021 12:08   VAS [place] LOWER EXTREMITY VENOUS (DVT)  Result Date: 07/13/2021  Lower Venous DVT Study Patient Name:  Chase Evans [facility]  Date of Exam:   07/13/2021 Medical Rec #: [id number]              Accession  #:    [id number] Date of Birth: 1940-12-15               Patient Gender: M Patient Age:   81 years Exam Location:  [facility] Procedure:      VAS [place] LOWER EXTREMITY VENOUS (DVT) Referring Phys: Lauraine Rinne --------------------------------------------------------------------------------  Indications: Swelling.  Performing Technologist: Bobetta Lime BS, RVT  Examination Guidelines: A complete evaluation includes B-mode imaging, spectral Doppler, color Doppler, and power Doppler as needed of all accessible portions of each vessel. Bilateral testing is considered an integral part of a complete examination. Limited examinations for reoccurring indications may be performed as noted. The reflux portion of the exam is performed with the patient in reverse Trendelenburg.  +---------+---------------+---------+-----------+----------+--------------+ RIGHT    CompressibilityPhasicitySpontaneityPropertiesThrombus Aging +---------+---------------+---------+-----------+----------+--------------+ CFV      Full           Yes      Yes                                 +---------+---------------+---------+-----------+----------+--------------+ SFJ      Full                                                        +---------+---------------+---------+-----------+----------+--------------+  FV Prox  Full                                                        +---------+---------------+---------+-----------+----------+--------------+ FV Mid   Full                                                        +---------+---------------+---------+-----------+----------+--------------+ FV DistalFull                                                        +---------+---------------+---------+-----------+----------+--------------+ PFV      Full                                                        +---------+---------------+---------+-----------+----------+--------------+ POP      Full            Yes      Yes                                 +---------+---------------+---------+-----------+----------+--------------+ PTV      Full                                                        +---------+---------------+---------+-----------+----------+--------------+ PERO     Full                                                        +---------+---------------+---------+-----------+----------+--------------+   +---------+---------------+---------+-----------+----------+--------------+ LEFT     CompressibilityPhasicitySpontaneityPropertiesThrombus Aging +---------+---------------+---------+-----------+----------+--------------+ CFV      Full           Yes      Yes                                 +---------+---------------+---------+-----------+----------+--------------+ SFJ      Full                                                        +---------+---------------+---------+-----------+----------+--------------+ FV Prox  Full                                                        +---------+---------------+---------+-----------+----------+--------------+   FV Mid   Full                                                        +---------+---------------+---------+-----------+----------+--------------+ FV DistalFull                                                        +---------+---------------+---------+-----------+----------+--------------+ PFV      Full                                                        +---------+---------------+---------+-----------+----------+--------------+ POP      Full           Yes      Yes                                 +---------+---------------+---------+-----------+----------+--------------+ PTV      Full                                                        +---------+---------------+---------+-----------+----------+--------------+ PERO     Full                                                         +---------+---------------+---------+-----------+----------+--------------+     Summary: BILATERAL: - No evidence of deep vein thrombosis seen in the lower extremities, bilaterally. -No evidence of popliteal cyst, bilaterally.   *See table(s) above for measurements and observations. Electronically signed by Harold Barban MD on 07/13/2021 at 10:47:49 PM.    Final    DG Chest 1 View  Result Date: 07/13/2021 CLINICAL DATA:  Fever EXAM: CHEST  1 VIEW COMPARISON:  02/22/2017 FINDINGS: Heart size within normal limits. Low lung volumes. No focal airspace consolidation, pleural effusion, or pneumothorax. IMPRESSION: No active disease. Electronically Signed   By: Davina Poke D.O.   On: 07/13/2021 08:51   Recent Labs    07/14/21 0546 07/15/21 0517  WBC 15.2* 14.9*  HGB 10.3* 10.6*  HCT 30.7* 31.5*  PLT 392 381   Recent Labs    07/14/21 0546 07/15/21 0517  NA 141 140  K 3.8 3.6  CL 109 108  CO2 19* 18*  GLUCOSE 80 81  BUN 61* 59*  CREATININE 5.31* 5.23*  CALCIUM 8.7* 8.5*    Intake/Output Summary (Last 24 hours) at 07/15/2021 0827 Last data filed at 07/15/2021 0730 Gross per 24 hour  Intake 720 ml  Output 700 ml  Net 20 ml        Physical Exam: Vital Signs Blood pressure (!) 168/77, pulse (!) 104, temperature 98.4 F (36.9 C), resp. rate  16, height 5\' 7"  (1.702 m), weight 73.6 kg, SpO2 96 %.      General: awake, alert, calm; sitting up drinking, but not eating; NAD HENT: conjugate gaze; oropharynx moist CV: regular to borderline tachycardic rate; no JVD Pulmonary: CTA B/L; no W/R/R- good air movement GI: soft, NT, ND, (+)BS Psychiatric: appropriate Neurological: Ox1-2- knows in hospital but not which one or why- doesn't remember interviewer- says he knows my face only Neurologic: Cranial nerves II through XII intact, motor strength is 5/5 in bilateral deltoid, bicep, tricep, grip, 4/5 hip flexor, knee extensors, ankle dorsiflexor and plantar flexor Sensory exam  normal sensation to light touch  in bilateral upper and lower extremities  Musculoskeletal: in TLSO when up to limit spine ROM    Assessment/Plan: 1. Functional deficits which require 3+ hours per day of interdisciplinary therapy in a comprehensive inpatient rehab setting. Physiatrist is providing close team supervision and 24 hour management of active medical problems listed below. Physiatrist and rehab team continue to assess barriers to discharge/monitor patient progress toward functional and medical goals  Care Tool:  Bathing    Body parts bathed by patient: Right arm, Left arm, Chest, Abdomen, Front perineal area, Left upper leg, Right upper leg, Face   Body parts bathed by helper: Buttocks, Right lower leg, Left lower leg     Bathing assist Assist Level: Minimal Assistance - Patient > 75%     Upper Body Dressing/Undressing Upper body dressing   What is the patient wearing?: Pull over shirt, Orthosis Orthosis activity level: Performed by helper  Upper body assist Assist Level: Minimal Assistance - Patient > 75%    Lower Body Dressing/Undressing Lower body dressing      What is the patient wearing?: Pants     Lower body assist Assist for lower body dressing: Minimal Assistance - Patient > 75%     Toileting Toileting Toileting Activity did not occur (Clothing management and hygiene only): N/A (no void or bm)  Toileting assist Assist for toileting: Moderate Assistance - Patient 50 - 74% (anticipated level based on observed performance)     Transfers Chair/bed transfer  Transfers assist     Chair/bed transfer assist level: Contact Guard/Touching assist     Locomotion Ambulation   Ambulation assist      Assist level: Contact Guard/Touching assist Assistive device: Walker-rolling Max distance: 150'   Walk 10 feet activity   Assist     Assist level: Contact Guard/Touching assist Assistive device: Walker-rolling   Walk 50 feet  activity   Assist    Assist level: Contact Guard/Touching assist Assistive device: Walker-rolling    Walk 150 feet activity   Assist    Assist level: Contact Guard/Touching assist Assistive device: Walker-rolling    Walk 10 feet on uneven surface  activity   Assist Walk 10 feet on uneven surfaces activity did not occur: Safety/medical concerns         Wheelchair     Assist Is the patient using a wheelchair?: Yes Type of Wheelchair: Manual    Wheelchair assist level: Dependent - Patient 0% Max wheelchair distance: 150'    Wheelchair 50 feet with 2 turns activity    Assist        Assist Level: Dependent - Patient 0%   Wheelchair 150 feet activity     Assist      Assist Level: Dependent - Patient 0%   Blood pressure (!) 168/77, pulse (!) 104, temperature 98.4 F (36.9 C), resp. rate 16, height 5'  7" (1.702 m), weight 73.6 kg, SpO2 96 %.  Medical Problem List and Plan: 1. 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   Con't CIR- PT, OT and SLP 2.  Antithrombotics: -DVT/anticoagulation:  Mechanical: Sequential compression devices, below knee Bilateral lower extremities             -antiplatelet therapy: N/a 3. Pain Management: Tylenol prn, stop cyclobenzaprine  6/26- usually controlled- con't regimen as it is-   6/28- taking tylenol- other opiates have been stopped  6/29- pain is controlled per pt- con't regimen 4. Mood/Sleep: Monitor sleep wake cycle with sleep chart.             --LCSW to follow for evaluation and support when appropriate.              -antipsychotic agents: none  5. Neuropsych/cognition: This patient is not capable of making decisions  on his own behalf. 6. Skin/Wound Care: Monitor back incision for healing. Routine pressure relief measures. 7. Fluids/Electrolytes/Nutrition: Strict I/O. Encourage fluid intake             -foley for now  6/26- pt pulled himself last night- will do I/o caths as required  6/27- requiring in/out caths a few times per day- on max dose of Flomax  6/29- pt still getting cathed but voiding some. Just not always emptying.  8. Acute blood loss anemia: Monitor H/H with serial checks on oral FeSO4             --Supplement with IV iron x 2 more days  and weekly aranesp 9. Acute on chronic renal failure: Monitor renal status             --avoid nephrotoxic drugs. IVF boluses prn?             --needs strict I/O  6/28- Cr pretty stable last few days- ~ 5-5.25-  10. BPH/Neurogenic bladder: Foley to help bladder decompress for a few days.             --Increase Flomax to 0.8mg /hs             --voiding trial early next week  6/26- pt pulled his foley out- will do I/o caths q6 hours base don bladder scans.   6/27- voiding some, but also needing some caths- con't regimen  6/28- maxxed out on Flomax -con't caths as required 11. Neurogenic bowel: Has had 2 documented BM since admission?             --Last BM after suppository. Will start daily suppository after supper.  12. HTN: Monitor BP TID--continue low dose Norvasc Vitals:   07/14/21 1953 07/15/21 0543  BP: (!) 154/77 (!) 168/77  Pulse: (!) 104 (!) 104  Resp: 17 16  Temp: 98.3 F (36.8 C) 98.4 F (36.9 C)  SpO2: 97% 96%   Elevated systolic will increase norvasc    6/26- BP still elevated, but got his first dose of increased dose this AM  6/27- will give Hydralazine 10 mg TID for now- since BP in 170s-180s range- will monitor trend  6/29- Will increase Hydralazine to 25 mg TID since BP still [id number] systolic.  13. Leukocytosis  6/26- WBC up from 12.2 to 14.9k- pt acting more confused- pulled foley overnight- will check U/A and Cx- by cath and if  doesn't have UTI (I think he does due to neurogenic bladder issues) then will complete additional ID work up  6/27- no UTI and no pneumonia- blood Cx's and Dopplers pending. If dopplers (-), will need to start broad spectrum ABX- no fever, but very sleepy- vitals OK otherwise. . Will check labs daily for the next 3 days-   6/28- WBC stable at 15k- Dopplers (-)- CXR and U/A (-)- blood Cxs are pending- but negative so far- did Head CT due to accompanying confusion- head Ct shows nothing acute- pt is afebrile and no other signs of infection- lactate 0.6- normal range- will con't daily labs and wait for blood Cx's-   6/29- Head Ct (-) for acute issues; WBC today is stable at 14.9 with mild l shift that is slightly improving-blood C'x (-) x2 days so far.  Wait on IV ABX, since only sign of infection is WBC, but otherwise, no other signs.    I spent a total of 37    minutes on total care today- >50% coordination of care- due to d/w PA at length about WBC and treatment vs monitoring.     LOS: 5 days A FACE TO FACE EVALUATION WAS PERFORMED  Ginna Schuur 07/15/2021, 8:27 AM

## 2021-07-15 NOTE — Progress Notes (Signed)
Occupational Therapy Session Note  Patient Details  Name: Chase Evans MRN: [id number] Date of Birth: Nov 27, 1940  Today's Date: 07/15/2021 OT Individual Time: 6295-2841 OT Individual Time Calculation (min): 57 min    Short Term Goals: Week 1:  OT Short Term Goal 1 (Week 1): Pt to be Min A for LB bathing/dressing with AE as needed and LRD OT Short Term Goal 2 (Week 1): Pt to be Min A for UB dressing OT Short Term Goal 3 (Week 1): Pt to be Min A for toileting with LRD OT Short Term Goal 4 (Week 1): Pt to be CGA for functional transfers with LRD OT Short Term Goal 5 (Week 1): Pt to improve UE strength to 4/5 to complete functional transfers  Skilled Therapeutic Interventions/Progress Updates:    Pt resting in bed upon arrival. Pt agreeable to sitting EOB to engage in bathing/dressing tasks. During bathing tasks pt continued to rewash body parts; question perseverance vs memory vs thoroughness. Min A for bahting and LB dressing. Dependent for donning TLSO. Sitting balance with supervision. Sit<>stand and standing balance with CGA. Amb in room using RW with CGA. Pt required verbal cues to initiate all tasks this morning. Pt remained in relciner with belt alarm activated. All needs within reach.   Therapy Documentation Precautions:  Precautions Precautions: Fall, Back Precaution Booklet Issued: Yes (comment) Precaution Comments: reviewed BLT Required Braces or Orthoses: Spinal Brace Spinal Brace: Thoracolumbosacral orthotic, Applied in sitting position Restrictions Weight Bearing Restrictions: No Pain:  Pt denies pain this morning   Therapy/Group: Individual Therapy  Leroy Libman 07/15/2021, 9:14 AM

## 2021-07-15 NOTE — Progress Notes (Signed)
Physical Therapy Session Note  Patient Details  Name: Chase Evans MRN: [id number] Date of Birth: September 26, 1940  Today's Date: 07/15/2021 PT Individual Time: 1415-1520 PT Individual Time Calculation (min): 65 min   Short Term Goals: Week 1:  PT Short Term Goal 1 (Week 1): =LTG due to ELOS  Skilled Therapeutic Interventions/Progress Updates:    Pt received seated in bed, agreeable to PT session. No complaints of pain. Seated in bed to sitting EOB with min A for some trunk elevation. Assisted pt with donning TLSO while seated EOB. Sit to stand and transfers with CGA to min A and RW during session. Pt requesting to go outside due to feeling cold indoors. Pt taken outdoors for improved mood and therapy buy-in. Ambulation outdoors across uneven ground with RW and min A for balance, mod cueing for safe RW management and attention to task. Ambulation through agility ladder forwards step-to with min HHA, lateral step-to with mod HHA. Ambulation weaving through cones with RW and CGA for balance with focus on safe RW management and obstacle avoidance. Pt reports feeling fatigued, returned to room and left seated in recliner with needs in reach, chair alarm in place. Pt missed 10 min of scheduled therapy session due to fatigue this date.  Therapy Documentation Precautions:  Precautions Precautions: Fall, Back Precaution Booklet Issued: Yes (comment) Precaution Comments: reviewed BLT Required Braces or Orthoses: Spinal Brace Spinal Brace: Thoracolumbosacral orthotic, Applied in sitting position Restrictions Weight Bearing Restrictions: No General: PT Amount of Missed Time (min): 10 Minutes PT Missed Treatment Reason: Patient fatigue      Therapy/Group: Individual Therapy   Excell Seltzer, PT, DPT, CSRS 07/15/2021, 3:50 PM

## 2021-07-15 NOTE — Progress Notes (Signed)
Speech Language Pathology Daily Session Note  Patient Details  Name: Chase Evans MRN: [id number] Date of Birth: Nov 28, 1940  Today's Date: 07/15/2021 SLP Individual Time: 8343-7357 SLP Individual Time Calculation (min): 55 min  Short Term Goals: Week 1: SLP Short Term Goal 1 (Week 1): STG = LTG d/t ELOS  Skilled Therapeutic Interventions: S: Pt seen this date for skilled ST intervention targeting cognitive-linguistic goals outlined above. Pt received awake/alert and OOB in recliner chair; TSLO donned. No c/o pain. Per chart review, continues to decline meals. Agreeable to ST intervention in hospital room. Emotional at times when discussing going home with his son and recalling family events that happened long ago; easily redirected.   O: Pt oriented to location and date with Mod-Max A for problem-solving and use of external aids (e.g. calendar). Total A for intellectual awareness for situation, reason for hospitalization, and TSLO guidelines. Completed ALFA subtest "Understanding Medicine Labels" with pt presenting with 70% accuracy given intermittent repetition and extended processing time and cues for initiation. Benefited from Mod-Max A to Max A verbal and visual cues to improve accuracy with simple + functional problem-solving tasks. Given decreased PO intake, SLP provided Max A verbal and visual cues for reviewing hospital menu, problem-solving, and sequencing to place call to kitchen, and selecting a meal he would be interested in trying. Reports he is a selective eater at baseline.    A: Pt remains somewhat stimulable for skilled ST intervention targeting functional recall, sustained attention, and problem-solving. Continue to recommend skilled ST intervention to maximize pt's independence and decrease caregiver burden in preparation for upcoming discharge home. Recommend 24/7 supervision and assistance at discharge for iADL completion.   P: Pt left in recliner chair with all safety  measures activated. Call bell reviewed and within reach and all immediate needs met. Continue per current ST POC.  Pain Denies pain; NAD  Therapy/Group: Individual Therapy  Qasim Diveley A Minervia Osso 07/15/2021, 10:57 AM

## 2021-07-15 NOTE — Progress Notes (Addendum)
Patient had bowel movement prior to suppository administration (earlier today). No bowel movement noted  after scheduled suppository was administered/prior to departure of this shift. Oncoming nurse notified.    Yehuda Mao, LPN

## 2021-07-16 LAB — BASIC METABOLIC PANEL
Anion gap: 16 — ABNORMAL HIGH (ref 5–15)
BUN: 60 mg/dL — ABNORMAL HIGH (ref 8–23)
CO2: 17 mmol/L — ABNORMAL LOW (ref 22–32)
Calcium: 8.6 mg/dL — ABNORMAL LOW (ref 8.9–10.3)
Chloride: 107 mmol/L (ref 98–111)
Creatinine, Ser: 5.54 mg/dL — ABNORMAL HIGH (ref 0.61–1.24)
GFR, Estimated: 10 mL/min — ABNORMAL LOW (ref >60)
Glucose, Bld: 74 mg/dL (ref 70–99)
Potassium: 3.7 mmol/L (ref 3.5–5.1)
Sodium: 140 mmol/L (ref 135–145)

## 2021-07-16 LAB — CBC WITH DIFFERENTIAL/PLATELET
Abs Immature Granulocytes: 0.27 10*3/uL — ABNORMAL HIGH (ref 0.00–0.07)
Basophils Absolute: 0.1 10*3/uL (ref 0.0–0.1)
Basophils Relative: 1 %
Eosinophils Absolute: 0.6 10*3/uL — ABNORMAL HIGH (ref 0.0–0.5)
Eosinophils Relative: 4 %
HCT: 32.3 % — ABNORMAL LOW (ref 39.0–52.0)
Hemoglobin: 11 g/dL — ABNORMAL LOW (ref 13.0–17.0)
Immature Granulocytes: 2 %
Lymphocytes Relative: 9 %
Lymphs Abs: 1.2 10*3/uL (ref 0.7–4.0)
MCH: 30.9 pg (ref 26.0–34.0)
MCHC: 34.1 g/dL (ref 30.0–36.0)
MCV: 90.7 fL (ref 80.0–100.0)
Monocytes Absolute: 0.8 10*3/uL (ref 0.1–1.0)
Monocytes Relative: 6 %
Neutro Abs: 10.8 10*3/uL — ABNORMAL HIGH (ref 1.7–7.7)
Neutrophils Relative %: 78 %
Platelets: 389 10*3/uL (ref 150–400)
RBC: 3.56 MIL/uL — ABNORMAL LOW (ref 4.22–5.81)
RDW: 14.2 % (ref 11.5–15.5)
WBC: 13.7 10*3/uL — ABNORMAL HIGH (ref 4.0–10.5)
nRBC: 0 % (ref 0.0–0.2)

## 2021-07-16 NOTE — Progress Notes (Signed)
Speech Language Pathology Daily Session Note  Patient Details  Name: Chase Evans MRN: [id number] Date of Birth: 09/07/1940  Today's Date: 07/16/2021 SLP Individual Time: 1001-1100 SLP Individual Time Calculation (min): 59 min  Short Term Goals: Week 1: SLP Short Term Goal 1 (Week 1): STG = LTG d/t ELOS  Skilled Therapeutic Interventions: Pt seen for skilled ST with focus on cognitive goals, pt upright in recliner with TSLO donned and agreeable to therapeutic tasks. Pt with vague recall of events of the morning, no awareness of activities during yesterday's ST session. Pt re-oriented to use of call button in room for all needs/mobility, decreased carryover of information stating "I don't fool with that thing". Pt requiring total A to recall rationale for hospitalization, recent medical timeline and current precautions + TLSO guidelines. SLP facilitating simple money management task by providing min-mod A verbal cues for 60% accuracy. Pt reports previously utilizing check writing and computer to pay bills but will need assistance with financial management at discharge due to current cognitive impairments. Pt requires mod A verbal cues throughout session to increase attention to tasks and reduce distractibility. Pt at many times quite tearful when talking about family, emotional support provided. Pt left in recliner with alarm belt activated and all needs within reach. Cont ST POC.   Pain Pain Assessment Pain Scale: 0-10 Pain Score: 0-No pain  Therapy/Group: Individual Therapy  Dewaine Conger 07/16/2021, 10:31 AM

## 2021-07-16 NOTE — Progress Notes (Signed)
PROGRESS NOTE   Subjective/Complaints:  LBM yesterday evening and small one overnight.  Will stop bowel program- going regularly.  Pt doesn't remember when last BM.  Slept OK Doesn't remember interviewer still.   No report from nursing that pt was agitated overnight.    ROS- impaired by cognition Objective:   CT HEAD WO CONTRAST (5MM)  Result Date: 07/14/2021 CLINICAL DATA:  Delirium.  Lumbar surgery 2 weeks ago. EXAM: CT HEAD WITHOUT CONTRAST TECHNIQUE: Contiguous axial images were obtained from the base of the skull through the vertex without intravenous contrast. RADIATION DOSE REDUCTION: This exam was performed according to the departmental dose-optimization program which includes automated exposure control, adjustment of the mA and/or kV according to patient size and/or use of iterative reconstruction technique. COMPARISON:  Head CT 01/06/2017 and MRI 01/13/2015 FINDINGS: Brain: There is no evidence of an acute cortically based infarct, intracranial hemorrhage, mass, midline shift, or extra-axial fluid collection. There are patchy hypodensities in the cerebral white matter bilaterally which have progressed and are nonspecific but compatible with moderately extensive chronic small vessel ischemic disease. Mild cerebral atrophy is within normal limits for age. Vascular: Calcified atherosclerosis at the skull base. No hyperdense vessel. Skull: No fracture or suspicious osseous lesion. Sinuses/Orbits: Visualized paranasal sinuses and mastoid air cells are clear. Bilateral cataract extraction. Other: None. IMPRESSION: 1. No evidence of acute intracranial abnormality. 2. Moderately extensive chronic small vessel ischemic disease, progressed from 2018. Electronically Signed   By: Logan Bores M.D.   On: 07/14/2021 12:08   Recent Labs    07/15/21 0517 07/16/21 0524  WBC 14.9* 13.7*  HGB 10.6* 11.0*  HCT 31.5* 32.3*  PLT 381 389    Recent Labs    07/15/21 0517 07/16/21 0524  NA 140 140  K 3.6 3.7  CL 108 107  CO2 18* 17*  GLUCOSE 81 74  BUN 59* 60*  CREATININE 5.23* 5.54*  CALCIUM 8.5* 8.6*    Intake/Output Summary (Last 24 hours) at 07/16/2021 1157 Last data filed at 07/16/2021 2620 Gross per 24 hour  Intake 240 ml  Output 2250 ml  Net -2010 ml        Physical Exam: Vital Signs Blood pressure (!) 163/82, pulse (!) 110, temperature 98 F (36.7 C), temperature source Oral, resp. rate 18, height 5\' 7"  (1.702 m), weight 73.6 kg, SpO2 98 %.       General: awake, alert, calm- sleepy; supine in bed; NAD HENT: conjugate gaze; oropharynx moist CV: mildly tachycardic rate; no JVD Pulmonary: CTA B/L; no W/R/R- good air movement GI: soft, NT, ND, (+)BS Psychiatric: calm- hard to wake up initially, but eventually did Neurological: Ox1- doesn't remember interviewer, or LBM Neurologic: Cranial nerves II through XII intact, motor strength is 5/5 in bilateral deltoid, bicep, tricep, grip, 4/5 hip flexor, knee extensors, ankle dorsiflexor and plantar flexor Sensory exam normal sensation to light touch  in bilateral upper and lower extremities  Musculoskeletal: in TLSO when up to limit spine ROM    Assessment/Plan: 1. Functional deficits which require 3+ hours per day of interdisciplinary therapy in a comprehensive inpatient rehab setting. Physiatrist is providing close team supervision and 24 hour management of active  medical problems listed below. Physiatrist and rehab team continue to assess barriers to discharge/monitor patient progress toward functional and medical goals  Care Tool:  Bathing    Body parts bathed by patient: Right arm, Left arm, Chest, Abdomen, Front perineal area, Right upper leg, Left upper leg, Face   Body parts bathed by helper: Buttocks, Right lower leg, Left lower leg     Bathing assist Assist Level: Minimal Assistance - Patient > 75%     Upper Body  Dressing/Undressing Upper body dressing   What is the patient wearing?: Pull over shirt, Orthosis Orthosis activity level: Performed by helper  Upper body assist Assist Level: Minimal Assistance - Patient > 75%    Lower Body Dressing/Undressing Lower body dressing      What is the patient wearing?: Pants     Lower body assist Assist for lower body dressing: Minimal Assistance - Patient > 75%     Toileting Toileting Toileting Activity did not occur (Clothing management and hygiene only): N/A (no void or bm)  Toileting assist Assist for toileting: Moderate Assistance - Patient 50 - 74% (anticipated level based on observed performance)     Transfers Chair/bed transfer  Transfers assist     Chair/bed transfer assist level: Minimal Assistance - Patient > 75%     Locomotion Ambulation   Ambulation assist      Assist level: Contact Guard/Touching assist Assistive device: Walker-rolling Max distance: 150'   Walk 10 feet activity   Assist     Assist level: Contact Guard/Touching assist Assistive device: Walker-rolling   Walk 50 feet activity   Assist    Assist level: Contact Guard/Touching assist Assistive device: Walker-rolling    Walk 150 feet activity   Assist    Assist level: Contact Guard/Touching assist Assistive device: Walker-rolling    Walk 10 feet on uneven surface  activity   Assist Walk 10 feet on uneven surfaces activity did not occur: Safety/medical concerns   Assist level: Minimal Assistance - Patient > 75% Assistive device: Walker-rolling   Wheelchair     Assist Is the patient using a wheelchair?: Yes Type of Wheelchair: Manual    Wheelchair assist level: Dependent - Patient 0% Max wheelchair distance: 150'    Wheelchair 50 feet with 2 turns activity    Assist        Assist Level: Dependent - Patient 0%   Wheelchair 150 feet activity     Assist      Assist Level: Dependent - Patient 0%   Blood  pressure (!) 163/82, pulse (!) 110, temperature 98 F (36.7 C), temperature source Oral, resp. rate 18, height 5\' 7"  (1.702 m), weight 73.6 kg, SpO2 98 %.  Medical Problem List and Plan: 1. 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   Con't CIR- PT, OT and SLP 2.  Antithrombotics: -DVT/anticoagulation:  Mechanical: Sequential compression devices, below knee Bilateral lower extremities             -antiplatelet therapy: N/a 3. Pain Management: Tylenol prn, stop cyclobenzaprine  6/26- usually controlled- con't regimen as it is-  6/28- taking tylenol- other opiates have been stopped  6/29- pain is controlled per pt- con't regimen 4. Mood/Sleep: Monitor sleep wake cycle with sleep chart.             --LCSW to follow for evaluation and support when appropriate.              -antipsychotic agents: none  5. Neuropsych/cognition: This patient is not capable of making decisions on his own behalf. Has hx of dementia with sundowning.  6. Skin/Wound Care: Monitor back incision for healing. Routine pressure relief measures. 7. Fluids/Electrolytes/Nutrition: Strict I/O. Encourage fluid intake             -foley for now  6/26- pt pulled himself last night- will do I/o caths as required  6/30- still requiring caths, but on max dose of Flomax-  8. Acute blood loss anemia: Monitor H/H with serial checks on oral FeSO4             --Supplement with IV iron x 2 more days  and weekly aranesp 9. Acute on chronic renal failure: Monitor renal status             --avoid nephrotoxic drugs. IVF boluses prn?             --needs strict I/O  6/28- Cr pretty stable last few days- ~ 5-5.25 6/30- Cr up to 5.5- but overall pretty  stable- -  10. BPH/Neurogenic bladder: Foley to help bladder decompress for a few days.             --Increase Flomax to 0.8mg /hs             --voiding trial early next week  6/26- pt pulled his foley out- will do I/o caths q6 hours base don bladder scans.   6/27- voiding some, but also needing some caths- con't regimen  6/28- maxxed out on Flomax -con't caths as required 11. Neurogenic bowel: Has had 2 documented BM since admission?             --Last BM after suppository. Will start daily suppository after supper.  12. HTN: Monitor BP TID--continue low dose Norvasc Vitals:   07/15/21 1923 07/16/21 0450  BP: (!) 162/84 (!) 163/82  Pulse: (!) 110 (!) 110  Resp: 20 18  Temp: 97.7 F (36.5 C) 98 F (36.7 C)  SpO2: 100% 98%   Elevated systolic will increase norvasc    6/26- BP still elevated, but got his first dose of increased dose this AM  6/27- will give Hydralazine 10 mg TID for now- since BP in 170s-180s range- will monitor trend  6/29- Will increase Hydralazine to 25 mg TID since BP still [id number] systolic.   6/30- increased Hydralazine- now in 621'H systolic which is improved- con't to monitor 13. Leukocytosis  6/26- WBC up from 12.2 to 14.9k- pt acting more confused- pulled foley overnight- will check U/A and Cx- by cath and if doesn't have UTI (I think he does due to neurogenic bladder issues) then will complete additional ID work up  6/27- no UTI and no pneumonia- blood Cx's and Dopplers pending. If dopplers (-), will need to start broad spectrum ABX- no fever, but very sleepy- vitals OK otherwise. . Will check labs daily for the next 3 days-   6/28- WBC stable at 15k- Dopplers (-)- CXR and U/A (-)- blood Cxs are pending- but negative so far- did Head CT due to accompanying confusion- head Ct shows nothing acute- pt is afebrile and  no other signs of infection- lactate 0.6- normal range- will con't daily labs and wait for blood Cx's-   6/29- Head Ct (-) for acute issues; WBC today  is stable at 14.9 with mild l shift that is slightly improving-blood C'x (-) x2 days so far.  Wait on IV ABX, since only sign of infection is WBC, but otherwise, no other signs.   6/30- WBC down to 13.7-  14. Tachycardia  6/30- sounds regular- doesn't have any SOB; O2 sats 98-100%- no reason to think has PE- will con't to push fluids-and monitor trend.    I spent a total of  37  minutes on total care today- >50% coordination of care- due to d/w PA about reducing WBC and tachycardia- don't think has PE- no other Sx's of it- Appears has hx of dementia with sundowning as well- will con't to monitor    LOS: 6 days A FACE TO FACE EVALUATION WAS PERFORMED  Chase Evans 07/16/2021, 8:08 AM

## 2021-07-16 NOTE — Progress Notes (Signed)
Occupational Therapy Session Note  Patient Details  Name: Chase Evans MRN: [id number] Date of Birth: [birth year]  Today's Date: 07/16/2021 OT Individual Time: 3546-5681 OT Individual Time Calculation (min): 75 min    Short Term Goals: Week 1:  OT Short Term Goal 1 (Week 1): Pt to be Min A for LB bathing/dressing with AE as needed and LRD OT Short Term Goal 2 (Week 1): Pt to be Min A for UB dressing OT Short Term Goal 3 (Week 1): Pt to be Min A for toileting with LRD OT Short Term Goal 4 (Week 1): Pt to be CGA for functional transfers with LRD OT Short Term Goal 5 (Week 1): Pt to improve UE strength to 4/5 to complete functional transfers  Skilled Therapeutic Interventions/Progress Updates:     Upon OT arrival, pt semi recumbent in bed. Pt reports no pain and was agreeable to OT treatment session. Treatment intervention focused on self care retraining. Pt completes supine to sit transfer with SBA and sit to stand transfer with CGA. Pt completes functional mobility with CGA using RW and completes sponge bath ADL and toileting at the levels below. Pt making progress towards stated OT goals and continues to benefit from OT services to achieve highest level of independence. Pt left in recliner at end of session with all needs met and safety measures in place.   Therapy Documentation Precautions:  Precautions Precautions: Fall, Back Precaution Booklet Issued: Yes (comment) Precaution Comments: reviewed BLT Required Braces or Orthoses: Spinal Brace Spinal Brace: Thoracolumbosacral orthotic, Applied in sitting position Restrictions Weight Bearing Restrictions: No   ADL: Grooming: Supervision/safety Where Assessed-Grooming: Standing at sink Upper Body Bathing: Supervision/safety Where Assessed-Upper Body Bathing: Sitting at sink Lower Body Bathing: Contact guard Where Assessed-Lower Body Bathing: Sitting at sink, Standing at sink Upper Body Dressing: Minimal assistance Where  Assessed-Upper Body Dressing: Sitting at sink Lower Body Dressing: Contact guard Where Assessed-Lower Body Dressing: Sitting at sink, Standing at sink Toileting: Unable to assess Toilet Transfer: Contact guard Toilet Transfer Method: Counselling psychologist: Grab bars   Therapy/Group: Individual Therapy  Keryn Nessler 07/16/2021, 9:03 AM

## 2021-07-16 NOTE — Plan of Care (Signed)
  Problem: Consults Goal: RH SPINAL CORD INJURY PATIENT EDUCATION Description:  See Patient Education module for education specifics.  Outcome: Progressing Goal: Skin Care Protocol Initiated - if Braden Score 18 or less Description: If consults are not indicated, leave blank or document N/A Outcome: Progressing   Problem: SCI BOWEL ELIMINATION Goal: RH STG MANAGE BOWEL WITH ASSISTANCE Description: STG Manage Bowel with Supervision Assistance. Outcome: Progressing Goal: RH STG SCI MANAGE BOWEL WITH MEDICATION WITH ASSISTANCE Description: STG SCI Manage bowel with medication with Supervision assistance. Outcome: Progressing   Problem: RH SKIN INTEGRITY Goal: RH STG MAINTAIN SKIN INTEGRITY WITH ASSISTANCE Description: STG Maintain Skin Integrity With Supervision Assistance. Outcome: Progressing Goal: RH STG ABLE TO PERFORM INCISION/WOUND CARE W/ASSISTANCE Description: STG Able To Perform Incision/Wound Care With Supervision Assistance. Outcome: Progressing   Problem: RH SAFETY Goal: RH STG ADHERE TO SAFETY PRECAUTIONS W/ASSISTANCE/DEVICE Description: STG Adhere to Safety Precautions With Cues and Reminders. Outcome: Progressing Goal: RH STG DECREASED RISK OF FALL WITH ASSISTANCE Description: STG Decreased Risk of Fall With supervision Assistance. Outcome: Progressing   Problem: RH PAIN MANAGEMENT Goal: RH STG PAIN MANAGED AT OR BELOW PT'S PAIN GOAL Description: < 3 on a 0-10 pain scale. Outcome: Progressing   Problem: RH KNOWLEDGE DEFICIT SCI Goal: RH STG INCREASE KNOWLEDGE OF SELF CARE AFTER SCI Description: Patient will demonstrate knowledge of self-care management, medication/pain management, safety awareness, weight bearing precautions with educational materials and handouts provided by staff independently at discharge. Outcome: Progressing

## 2021-07-16 NOTE — Progress Notes (Signed)
Physical Therapy Session Note  Patient Details  Name: Chase Evans MRN: [id number] Date of Birth: 26-Sep-1940  Today's Date: 07/16/2021 PT Individual Time: 1410-1505 PT Individual Time Calculation (min): 55 min   Short Term Goals: Week 1:  PT Short Term Goal 1 (Week 1): =LTG due to ELOS  Skilled Therapeutic Interventions/Progress Updates: Pt presented in recliner agreeable to therapy. Pt states some mild pain with mobility but did not rate, did not request any therapeutic intervention, and did not demonstrate any pain behaviors during session. Pt noted to be more lethargic during session however when looked at sleep chart pt awake several hours during the night. PTA applied TLSO and donned shoes total A for time management. Pt ambulated to day room with CGA and RW with cues for improved posture and for increased B foot clearance as pt's shoes frequently brushed against floor when ambulating. Pt participated in standing dynamic balance activity throwing mini basketball to hoop while placing single UE on 4in step without AD. Pt was CGA when RLE placed on step but required minA when LLE placed on Step. Increased lateral lean and increased instability noted when LLE on step but no actual LOB noted. Activity was changed to placing and removing clothespins from basketball net with single UE placed on 4in step with pt demonstrating some improvement in balance when changing feet however with fatigue when RLE placed on step pt with x 2 occurences of L knee buckling. Pt then participated in gait training ambulating with RW (due to knee buckling) while stepping over thresholds. Pt demonstrated fair safety with RW and was able to safely clear dowels placed on hold x 8. After brief rest pt ambulated back to room in same manner as prior and returned to recliner. Pt left in recliner at end of session with belt alarm on, call bell within reach and needs met.      Therapy Documentation Precautions:   Precautions Precautions: Fall, Back Precaution Booklet Issued: Yes (comment) Precaution Comments: reviewed BLT Required Braces or Orthoses: Spinal Brace Spinal Brace: Thoracolumbosacral orthotic, Applied in sitting position Restrictions Weight Bearing Restrictions: No General:   Vital Signs: Therapy Vitals Temp: 98.2 F (36.8 C) Temp Source: Oral Pulse Rate: 96 Resp: 18 BP: 127/70 Patient Position (if appropriate): Sitting Oxygen Therapy SpO2: 99 % O2 Device: Room Air Pain:   Mobility:   Locomotion :    Trunk/Postural Assessment :    Balance:   Exercises:   Other Treatments:      Therapy/Group: Individual Therapy  [name] 07/16/2021, 4:03 PM

## 2021-07-17 NOTE — Progress Notes (Signed)
PROGRESS NOTE   Subjective/Complaints:  Pt reports was gagging this Am when brushing teeth- is new- but says he thinks due to "wrong toothpaste".   Denies pain this Am sitting in w/c.    ROS-impaired by cognition Objective:   No results found. Recent Labs    07/15/21 0517 07/16/21 0524  WBC 14.9* 13.7*  HGB 10.6* 11.0*  HCT 31.5* 32.3*  PLT 381 389   Recent Labs    07/15/21 0517 07/16/21 0524  NA 140 140  K 3.6 3.7  CL 108 107  CO2 18* 17*  GLUCOSE 81 74  BUN 59* 60*  CREATININE 5.23* 5.54*  CALCIUM 8.5* 8.6*    Intake/Output Summary (Last 24 hours) at 07/17/2021 1101 Last data filed at 07/16/2021 1859 Gross per 24 hour  Intake 240 ml  Output --  Net 240 ml        Physical Exam: Vital Signs Blood pressure (!) 152/73, pulse (!) 109, temperature 97.8 F (36.6 C), temperature source Oral, resp. rate 20, height 5\' 7"  (1.702 m), weight 73.6 kg, SpO2 97 %.        General: awake, alert, appropriate, sitting up in w/c- just finished brushing teeth- at sink with PT; NAD HENT: conjugate gaze; oropharynx moist- tongue not coated now; tears,  CV: mildly tachycardic  rate; no JVD Pulmonary: CTA B/L; no W/R/R- good air movement GI: soft, NT, ND, (+)BS Psychiatric: calm Neurological: Ox1- calm Neurologic: Cranial nerves II through XII intact, motor strength is 5/5 in bilateral deltoid, bicep, tricep, grip, 4/5 hip flexor, knee extensors, ankle dorsiflexor and plantar flexor Sensory exam normal sensation to light touch  in bilateral upper and lower extremities  Musculoskeletal: in TLSO when up to limit spine ROM    Assessment/Plan: 1. Functional deficits which require 3+ hours per day of interdisciplinary therapy in a comprehensive inpatient rehab setting. Physiatrist is providing close team supervision and 24 hour management of active medical problems listed below. Physiatrist and rehab team continue to  assess barriers to discharge/monitor patient progress toward functional and medical goals  Care Tool:  Bathing    Body parts bathed by patient: Right arm, Left arm, Chest, Abdomen, Front perineal area, Right upper leg, Left upper leg, Face, Buttocks, Right lower leg, Left lower leg   Body parts bathed by helper: Buttocks, Right lower leg, Left lower leg     Bathing assist Assist Level: Contact Guard/Touching assist     Upper Body Dressing/Undressing Upper body dressing   What is the patient wearing?: Pull over shirt, Orthosis Orthosis activity level: Performed by helper  Upper body assist Assist Level: Minimal Assistance - Patient > 75%    Lower Body Dressing/Undressing Lower body dressing      What is the patient wearing?: Pants, Underwear/pull up     Lower body assist Assist for lower body dressing: Contact Guard/Touching assist     Toileting Toileting Toileting Activity did not occur (Clothing management and hygiene only): N/A (no void or bm)  Toileting assist Assist for toileting: Moderate Assistance - Patient 50 - 74% (anticipated level based on observed performance)     Transfers Chair/bed transfer  Transfers assist     Chair/bed  transfer assist level: Minimal Assistance - Patient > 75%     Locomotion Ambulation   Ambulation assist      Assist level: Contact Guard/Touching assist Assistive device: Walker-rolling Max distance: 150'   Walk 10 feet activity   Assist     Assist level: Contact Guard/Touching assist Assistive device: Walker-rolling   Walk 50 feet activity   Assist    Assist level: Contact Guard/Touching assist Assistive device: Walker-rolling    Walk 150 feet activity   Assist    Assist level: Contact Guard/Touching assist Assistive device: Walker-rolling    Walk 10 feet on uneven surface  activity   Assist Walk 10 feet on uneven surfaces activity did not occur: Safety/medical concerns   Assist level: Minimal  Assistance - Patient > 75% Assistive device: Walker-rolling   Wheelchair     Assist Is the patient using a wheelchair?: Yes Type of Wheelchair: Manual    Wheelchair assist level: Dependent - Patient 0% Max wheelchair distance: 150'    Wheelchair 50 feet with 2 turns activity    Assist        Assist Level: Dependent - Patient 0%   Wheelchair 150 feet activity     Assist      Assist Level: Dependent - Patient 0%   Blood pressure (!) 152/73, pulse (!) 109, temperature 97.8 F (36.6 C), temperature source Oral, resp. rate 20, height 5\' 7"  (1.702 m), weight 73.6 kg, SpO2 97 %.  Medical Problem List and Plan: 1. 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   Con't CIR- PT, OT and SL_ monitor since was gagging with brushing teeth 2.  Antithrombotics: -DVT/anticoagulation:  Mechanical: Sequential compression devices, below knee Bilateral lower extremities             -antiplatelet therapy: N/a 3. Pain Management: Tylenol prn, stop cyclobenzaprine  6/26- usually controlled- con't regimen as it is-   7/1- pt denies pain- con't regimen 4. Mood/Sleep: Monitor sleep wake cycle with sleep chart.             --LCSW to follow for evaluation and support when appropriate.              -antipsychotic agents: none  5. Neuropsych/cognition: This patient is not capable of making decisions on his own behalf. Has hx of dementia with sundowning.  6. Skin/Wound Care: Monitor back incision for healing. Routine pressure relief measures. 7. Fluids/Electrolytes/Nutrition: Strict I/O. Encourage fluid intake             -foley for now  6/26- pt pulled himself last night- will do I/o caths as  required  6/30- still requiring caths, but on max dose of Flomax-  8. Acute blood loss anemia: Monitor H/H with serial checks on oral FeSO4             --Supplement with IV iron x 2 more days  and weekly aranesp 9. Acute on chronic renal failure: Monitor renal status             --avoid nephrotoxic drugs. IVF boluses prn?             --  needs strict I/O  6/28- Cr pretty stable last few days- ~ 5-5.25 6/30- Cr up to 5.5- but overall pretty stable- - 7/1- will recheck Monday 10. BPH/Neurogenic bladder: Foley to help bladder decompress for a few days.             --Increase Flomax to 0.8mg /hs             --voiding trial early next week  6/26- pt pulled his foley out- will do I/o caths q6 hours base don bladder scans.   6/27- voiding some, but also needing some caths- con't regimen  6/28- maxxed out on Flomax -con't caths as required 11. Neurogenic bowel: Has had 2 documented BM since admission?             --Last BM after suppository. Will start daily suppository after supper.  12. HTN: Monitor BP TID--continue low dose Norvasc Vitals:   07/17/21 0451 07/17/21 0726  BP: (!) 149/84 (!) 152/73  Pulse: (!) 109   Resp: 20   Temp: 97.8 F (36.6 C)   SpO2: 97%    Elevated systolic will increase norvasc    6/26- BP still elevated, but got his first dose of increased dose this AM  6/27- will give Hydralazine 10 mg TID for now- since BP in 170s-180s range- will monitor trend  6/29- Will increase Hydralazine to 25 mg TID since BP still 992E-268T systolic.   6/30- increased Hydralazine to 25 mg TID- now in 419'Q systolic which is improved- con't to monitor  7/1- BP much better- 140s-150s-con't regimen and monitor trend  13. Leukocytosis  6/26- WBC up from 12.2 to 14.9k- pt acting more confused- pulled foley overnight- will check U/A and Cx- by cath and if doesn't have UTI (I think he does due to neurogenic bladder issues) then will complete additional ID work up  6/27- no UTI and no pneumonia-  blood Cx's and Dopplers pending. If dopplers (-), will need to start broad spectrum ABX- no fever, but very sleepy- vitals OK otherwise. . Will check labs daily for the next 3 days-   6/28- WBC stable at 15k- Dopplers (-)- CXR and U/A (-)- blood Cxs are pending- but negative so far- did Head CT due to accompanying confusion- head Ct shows nothing acute- pt is afebrile and no other signs of infection- lactate 0.6- normal range- will con't daily labs and wait for blood Cx's-   6/29- Head Ct (-) for acute issues; WBC today is stable at 14.9 with mild l shift that is slightly improving-blood C'x (-) x2 days so far.  Wait on IV ABX, since only sign of infection is WBC, but otherwise, no other signs.   6/30- WBC down to 13.7-  14. Tachycardia  6/30- sounds regular- doesn't have any SOB; O2 sats 98-100%- no reason to think has PE- will con't to push fluids-and monitor trend.   7/1- if doesn't improve, might need a low dose b blocker-      LOS: 7 days A FACE TO FACE EVALUATION WAS PERFORMED  Shilpa Bushee 07/17/2021, 11:01 AM

## 2021-07-17 NOTE — Progress Notes (Signed)
Physical Therapy Session Note  Patient Details  Name: Chase Evans MRN: [id number] Date of Birth: 01/20/1940  Today's Date: 07/17/2021 PT Individual Time: 0800-0915 PT Individual Time Calculation (min): 75 min   Short Term Goals: Week 1:  PT Short Term Goal 1 (Week 1): =LTG due to ELOS  Skilled Therapeutic Interventions/Progress Updates:    pt received in bed and agreeable to therapy with encouragement. No complaint of pain. Pt initially resistant to therapy because, "I'm lazy" and "it's cold" But agreeable to getting up to bathroom with encouragement. Min A supine>sit with cueing. Assist to thread pants for time. Pt pulled over hips in standing with CGA. Sit to stand and ambulatory transfer to bathroom with CGA. No void at this time. CGA for 3/3 toileting tasks. MD in/out for consult. Pt attempted to brush teeth in standing, but appeared to gag on toothpaste as soon as putting in in his mouth. Pt perseverated on that he uses different toothpaste and appears to have strong aversion to this one. Pt only able to recover after rinsing several times with water. Pt ambulated 3 x 80-100 ft with CGA and RW. Frequent cuing for incr step height d/t shuffling gait pattern and upright posture d/t flexed trunk posture. After first walk, pt states urgent need to toilet so ambulated to nearest toilet. No void, Supervision for 3/3 toileting tasks. Pt able to stand from low toilet seat with CGA. Pt returned to room and remained in w/c with his family present and alarm active. . Provided with ensure supplement at end of session per MD request.   Therapy Documentation Precautions:  Precautions Precautions: Fall, Back Precaution Booklet Issued: Yes (comment) Precaution Comments: reviewed BLT Required Braces or Orthoses: Spinal Brace Spinal Brace: Thoracolumbosacral orthotic, Applied in sitting position Restrictions Weight Bearing Restrictions: No General:       Therapy/Group: Individual  Therapy  Chase Evans 07/17/2021, 8:19 AM

## 2021-07-17 NOTE — Plan of Care (Signed)
  Problem: Consults Goal: RH SPINAL CORD INJURY PATIENT EDUCATION Description:  See Patient Education module for education specifics.  Outcome: Progressing Goal: Skin Care Protocol Initiated - if Braden Score 18 or less Description: If consults are not indicated, leave blank or document N/A Outcome: Progressing   Problem: SCI BOWEL ELIMINATION Goal: RH STG MANAGE BOWEL WITH ASSISTANCE Description: STG Manage Bowel with Supervision Assistance. Outcome: Progressing Goal: RH STG SCI MANAGE BOWEL WITH MEDICATION WITH ASSISTANCE Description: STG SCI Manage bowel with medication with Supervision assistance. Outcome: Progressing   Problem: RH SKIN INTEGRITY Goal: RH STG MAINTAIN SKIN INTEGRITY WITH ASSISTANCE Description: STG Maintain Skin Integrity With Supervision Assistance. Outcome: Progressing Goal: RH STG ABLE TO PERFORM INCISION/WOUND CARE W/ASSISTANCE Description: STG Able To Perform Incision/Wound Care With Supervision Assistance. Outcome: Progressing   Problem: RH SAFETY Goal: RH STG ADHERE TO SAFETY PRECAUTIONS W/ASSISTANCE/DEVICE Description: STG Adhere to Safety Precautions With Cues and Reminders. Outcome: Progressing Goal: RH STG DECREASED RISK OF FALL WITH ASSISTANCE Description: STG Decreased Risk of Fall With supervision Assistance. Outcome: Progressing   Problem: RH PAIN MANAGEMENT Goal: RH STG PAIN MANAGED AT OR BELOW PT'S PAIN GOAL Description: < 3 on a 0-10 pain scale. Outcome: Progressing   Problem: RH KNOWLEDGE DEFICIT SCI Goal: RH STG INCREASE KNOWLEDGE OF SELF CARE AFTER SCI Description: Patient will demonstrate knowledge of self-care management, medication/pain management, safety awareness, weight bearing precautions with educational materials and handouts provided by staff independently at discharge. Outcome: Progressing

## 2021-07-18 LAB — CULTURE, BLOOD (ROUTINE X 2)
Culture: NO GROWTH
Culture: NO GROWTH
Special Requests: ADEQUATE

## 2021-07-18 MED ORDER — METOPROLOL TARTRATE 12.5 MG HALF TABLET
12.5000 mg | ORAL_TABLET | Freq: Two times a day (BID) | ORAL | Status: DC
Start: 2021-07-18 — End: 2021-07-21
  Administered 2021-07-18 – 2021-07-21 (×7): 12.5 mg via ORAL
  Filled 2021-07-18 (×7): qty 1

## 2021-07-18 NOTE — Progress Notes (Signed)
PROGRESS NOTE   Subjective/Complaints:  Pt sleeping- woke briefly- asked me to let her go back to sleep.  Also nursing reports that HR running 110s-120s-     ROS-impaired by cognition/sedation Objective:   No results found. Recent Labs    07/16/21 0524  WBC 13.7*  HGB 11.0*  HCT 32.3*  PLT 389   Recent Labs    07/16/21 0524  NA 140  K 3.7  CL 107  CO2 17*  GLUCOSE 74  BUN 60*  CREATININE 5.54*  CALCIUM 8.6*    Intake/Output Summary (Last 24 hours) at 07/18/2021 0834 Last data filed at 07/18/2021 0400 Gross per 24 hour  Intake 120 ml  Output 1275 ml  Net -1155 ml        Physical Exam: Vital Signs Blood pressure (!) 162/86, pulse (!) 110, temperature 98.1 F (36.7 C), temperature source Oral, resp. rate 20, height 5\' 7"  (1.702 m), weight 71.4 kg, SpO2 98 %.         General: sleeping, but woke briefly;  NAD HENT: conjugate gaze; oropharynx moist CV: tachycardic rate- regular rhythm on exam; no JVD Pulmonary: CTA B/L; no W/R/R- good air movement GI: soft, NT, ND, (+)BS Psychiatric: calm- sleepy but woke briefly.  Neurological: Ox1-  Neurologic: Cranial nerves II through XII intact, motor strength is 5/5 in bilateral deltoid, bicep, tricep, grip, 4/5 hip flexor, knee extensors, ankle dorsiflexor and plantar flexor Sensory exam normal sensation to light touch  in bilateral upper and lower extremities  Musculoskeletal: in TLSO when up to limit spine ROM    Assessment/Plan: 1. Functional deficits which require 3+ hours per day of interdisciplinary therapy in a comprehensive inpatient rehab setting. Physiatrist is providing close team supervision and 24 hour management of active medical problems listed below. Physiatrist and rehab team continue to assess barriers to discharge/monitor patient progress toward functional and medical goals  Care Tool:  Bathing    Body parts bathed by patient: Right  arm, Left arm, Chest, Abdomen, Front perineal area, Right upper leg, Left upper leg, Face, Buttocks, Right lower leg, Left lower leg   Body parts bathed by helper: Buttocks, Right lower leg, Left lower leg     Bathing assist Assist Level: Contact Guard/Touching assist     Upper Body Dressing/Undressing Upper body dressing   What is the patient wearing?: Pull over shirt, Orthosis Orthosis activity level: Performed by helper  Upper body assist Assist Level: Minimal Assistance - Patient > 75%    Lower Body Dressing/Undressing Lower body dressing      What is the patient wearing?: Pants, Underwear/pull up     Lower body assist Assist for lower body dressing: Contact Guard/Touching assist     Toileting Toileting Toileting Activity did not occur (Clothing management and hygiene only): N/A (no void or bm)  Toileting assist Assist for toileting: Moderate Assistance - Patient 50 - 74% (anticipated level based on observed performance)     Transfers Chair/bed transfer  Transfers assist     Chair/bed transfer assist level: Minimal Assistance - Patient > 75%     Locomotion Ambulation   Ambulation assist      Assist level: Contact Guard/Touching assist  Assistive device: Walker-rolling Max distance: 150'   Walk 10 feet activity   Assist     Assist level: Contact Guard/Touching assist Assistive device: Walker-rolling   Walk 50 feet activity   Assist    Assist level: Contact Guard/Touching assist Assistive device: Walker-rolling    Walk 150 feet activity   Assist    Assist level: Contact Guard/Touching assist Assistive device: Walker-rolling    Walk 10 feet on uneven surface  activity   Assist Walk 10 feet on uneven surfaces activity did not occur: Safety/medical concerns   Assist level: Minimal Assistance - Patient > 75% Assistive device: Walker-rolling   Wheelchair     Assist Is the patient using a wheelchair?: Yes Type of Wheelchair:  Manual    Wheelchair assist level: Dependent - Patient 0% Max wheelchair distance: 150'    Wheelchair 50 feet with 2 turns activity    Assist        Assist Level: Dependent - Patient 0%   Wheelchair 150 feet activity     Assist      Assist Level: Dependent - Patient 0%   Blood pressure (!) 162/86, pulse (!) 110, temperature 98.1 F (36.7 C), temperature source Oral, resp. rate 20, height 5\' 7"  (1.702 m), weight 71.4 kg, SpO2 98 %.  Medical Problem List and Plan: 1. 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   Con't CIR- PT, OT and SLP 2.  Antithrombotics: -DVT/anticoagulation:  Mechanical: Sequential compression devices, below knee Bilateral lower extremities             -antiplatelet therapy: N/a 3. Pain Management: Tylenol prn, stop cyclobenzaprine  6/26- usually controlled- con't regimen as it is-   7/1- pt denies pain- con't regimen 4. Mood/Sleep: Monitor sleep wake cycle with sleep chart.             --LCSW to follow for evaluation and support when appropriate.              -antipsychotic agents: none  5. Neuropsych/cognition: This patient is not capable of making decisions on his own behalf. Has hx of dementia with sundowning.  6. Skin/Wound Care: Monitor back incision for healing. Routine pressure relief measures. 7. Fluids/Electrolytes/Nutrition: Strict I/O. Encourage fluid intake             -foley for now  6/26- pt pulled himself last night- will do I/o caths as required  6/30- still requiring caths, but on max dose of Flomax-   7/2- still requiring some in/out caths- might require foley at d/c if this doesn't improve.  8. Acute blood loss anemia: Monitor H/H with  serial checks on oral FeSO4             --Supplement with IV iron x 2 more days  and weekly aranesp 9. Acute on chronic renal failure: Monitor renal status             --avoid nephrotoxic drugs. IVF boluses prn?             --needs strict I/O  6/28- Cr pretty stable last few days- ~  5-5.25 6/30- Cr up to 5.5- but overall pretty stable- - 7/1- will recheck Monday 10. BPH/Neurogenic bladder: Foley to help bladder decompress for a few days.             --Increase Flomax to 0.8mg /hs             --voiding trial early next week  6/26- pt pulled his foley out- will do I/o caths q6 hours base don bladder scans.   6/27- voiding some, but also needing some caths- con't regimen  6/28- maxxed out on Flomax -con't caths as required 11. Neurogenic bowel: Has had 2 documented BM since admission?             --Last BM after suppository. Will start daily suppository after supper.  12. HTN: Monitor BP TID--continue low dose Norvasc Vitals:   07/17/21 1947 07/18/21 0420  BP: (!) 155/69 (!) 162/86  Pulse: (!) 108 (!) 110  Resp: 20 20  Temp: 98.3 F (36.8 C) 98.1 F (36.7 C)  SpO2: 98% 98%   Elevated systolic will increase norvasc    6/26- BP still elevated, but got his first dose of increased dose this AM  6/27- will give Hydralazine 10 mg TID for now- since BP in 170s-180s range- will monitor trend  6/29- Will increase Hydralazine to 25 mg TID since BP still [id number] systolic.   6/30- increased Hydralazine to 25 mg TID- now in 332'R systolic which is improved- con't to monitor  7/1- BP much better- 140s-150s-con't regimen and monitor trend   7/2- will add Metoprolol 12.5 mg BID for BP/HR 13. Leukocytosis  6/26- WBC up from 12.2 to 14.9k- pt acting more confused- pulled foley overnight- will check U/A and Cx- by cath and if doesn't have UTI (I think he does due to neurogenic bladder issues) then will complete additional ID work up  6/27- no UTI and no pneumonia- blood Cx's and Dopplers pending. If  dopplers (-), will need to start broad spectrum ABX- no fever, but very sleepy- vitals OK otherwise. . Will check labs daily for the next 3 days-   6/28- WBC stable at 15k- Dopplers (-)- CXR and U/A (-)- blood Cxs are pending- but negative so far- did Head CT due to accompanying confusion- head Ct shows nothing acute- pt is afebrile and no other signs of infection- lactate 0.6- normal range- will con't daily labs and wait for blood Cx's-   6/29- Head Ct (-) for acute issues; WBC today is stable at 14.9 with mild l shift that is slightly improving-blood C'x (-) x2 days so far.  Wait on IV ABX, since only sign of infection is WBC, but otherwise, no other signs.   6/30- WBC down to 13.7-  14. Tachycardia  6/30- sounds regular- doesn't have any SOB; O2 sats 98-100%- no reason to think has PE- will con't to push fluids-and monitor trend.   7/1- if doesn't improve, might need a low dose b blocker-   7/2- hasn't improved- running ~ 110s on exam- will start Metoprolol 12.5 mg BID since BP still also elevated in 518A systolic.   -appears on exam to be in regular rhythm.   I spent a total of 37   minutes on total care today- >50% coordination of care- due to d/w nursing about tachycardia- will see how responds to Metoprolol- if doesn't respond, might need further workup.     LOS: 8 days A FACE TO FACE EVALUATION WAS PERFORMED  Romanita Fager 07/18/2021, 8:34 AM

## 2021-07-18 NOTE — Plan of Care (Signed)
  Problem: Consults Goal: RH SPINAL CORD INJURY PATIENT EDUCATION Description:  See Patient Education module for education specifics.  Outcome: Progressing Goal: Skin Care Protocol Initiated - if Braden Score 18 or less Description: If consults are not indicated, leave blank or document N/A Outcome: Progressing   Problem: SCI BOWEL ELIMINATION Goal: RH STG MANAGE BOWEL WITH ASSISTANCE Description: STG Manage Bowel with Supervision Assistance. Outcome: Progressing Goal: RH STG SCI MANAGE BOWEL WITH MEDICATION WITH ASSISTANCE Description: STG SCI Manage bowel with medication with Supervision assistance. Outcome: Progressing   Problem: RH SKIN INTEGRITY Goal: RH STG MAINTAIN SKIN INTEGRITY WITH ASSISTANCE Description: STG Maintain Skin Integrity With Supervision Assistance. Outcome: Progressing Goal: RH STG ABLE TO PERFORM INCISION/WOUND CARE W/ASSISTANCE Description: STG Able To Perform Incision/Wound Care With Supervision Assistance. Outcome: Progressing   Problem: RH SAFETY Goal: RH STG ADHERE TO SAFETY PRECAUTIONS W/ASSISTANCE/DEVICE Description: STG Adhere to Safety Precautions With Cues and Reminders. Outcome: Progressing Goal: RH STG DECREASED RISK OF FALL WITH ASSISTANCE Description: STG Decreased Risk of Fall With supervision Assistance. Outcome: Progressing   Problem: RH PAIN MANAGEMENT Goal: RH STG PAIN MANAGED AT OR BELOW PT'S PAIN GOAL Description: < 3 on a 0-10 pain scale. Outcome: Progressing   Problem: RH KNOWLEDGE DEFICIT SCI Goal: RH STG INCREASE KNOWLEDGE OF SELF CARE AFTER SCI Description: Patient will demonstrate knowledge of self-care management, medication/pain management, safety awareness, weight bearing precautions with educational materials and handouts provided by staff independently at discharge. Outcome: Progressing

## 2021-07-19 DIAGNOSIS — D72823 Leukemoid reaction: Secondary | ICD-10-CM

## 2021-07-19 DIAGNOSIS — I10 Essential (primary) hypertension: Secondary | ICD-10-CM

## 2021-07-19 DIAGNOSIS — R339 Retention of urine, unspecified: Secondary | ICD-10-CM

## 2021-07-19 DIAGNOSIS — R000 Tachycardia, unspecified: Secondary | ICD-10-CM

## 2021-07-19 LAB — BASIC METABOLIC PANEL
Anion gap: 16 — ABNORMAL HIGH (ref 5–15)
BUN: 59 mg/dL — ABNORMAL HIGH (ref 8–23)
CO2: 18 mmol/L — ABNORMAL LOW (ref 22–32)
Calcium: 8.5 mg/dL — ABNORMAL LOW (ref 8.9–10.3)
Chloride: 105 mmol/L (ref 98–111)
Creatinine, Ser: 5.61 mg/dL — ABNORMAL HIGH (ref 0.61–1.24)
GFR, Estimated: 10 mL/min — ABNORMAL LOW (ref >60)
Glucose, Bld: 114 mg/dL — ABNORMAL HIGH (ref 70–99)
Potassium: 3.2 mmol/L — ABNORMAL LOW (ref 3.5–5.1)
Sodium: 139 mmol/L (ref 135–145)

## 2021-07-19 LAB — CBC
HCT: 31.7 % — ABNORMAL LOW (ref 39.0–52.0)
Hemoglobin: 10.6 g/dL — ABNORMAL LOW (ref 13.0–17.0)
MCH: 30.7 pg (ref 26.0–34.0)
MCHC: 33.4 g/dL (ref 30.0–36.0)
MCV: 91.9 fL (ref 80.0–100.0)
Platelets: 341 10*3/uL (ref 150–400)
RBC: 3.45 MIL/uL — ABNORMAL LOW (ref 4.22–5.81)
RDW: 14.9 % (ref 11.5–15.5)
WBC: 13.1 10*3/uL — ABNORMAL HIGH (ref 4.0–10.5)
nRBC: 0 % (ref 0.0–0.2)

## 2021-07-19 MED ORDER — RENA-VITE PO TABS
1.0000 | ORAL_TABLET | Freq: Every day | ORAL | Status: DC
  Administered 2021-07-19 – 2021-07-20 (×2): 1 via ORAL
  Filled 2021-07-19 (×2): qty 1

## 2021-07-19 NOTE — Progress Notes (Signed)
Physical Therapy Session Note  Patient Details  Name: Chase Evans MRN: [id number] Date of Birth: 20-Apr-1940  Today's Date: 07/19/2021 PT Individual Time: 0900-1000 PT Individual Time Calculation (min): 60 min   Short Term Goals: Week 1:  PT Short Term Goal 1 (Week 1): =LTG due to ELOS  Skilled Therapeutic Interventions/Progress Updates:    pt received in bed and agreeable to therapy. No complaint of pain. Pt's sons and DIL were present throughout session for family education. Pt performed tasks with both therapist to demonstrate and all 3 family members. Pt's family members demoed appropriate guarding and supervision throughout, therapist answered questions as able. Family with questions about I&O cathing, therapist suggested they seek out MD on whether or not they will be d/c with caths. Educated family on BLT spinal precautions and donning TLSO. Bed mobility with log roll technique CGA. Donned TLSO total A while demonstrating for family. Pt requested to use bathroom before leaving room. Sit to stand and ambulatory transfer with RW and supervision. Supervision for 3/3 toileting tasks. Noted small continent bladder void. Documented in flow sheet. MD in/out for rounds. Pt transported to therapy gym for time management and energy conservation. Pt navigated 6" stairs 4 x 4 with seated rest breaks between practice with therapist and family members. Pt utilized 2 hand rails and self selected alternating technique. Required CGA-supervision throughout. Pt then performed car transfer x 4. Requires cueing for safety to back up to car d/t use of running board. Pt able to navigate but prefers to step in laterally. Discussed with family recommending backing up to car for safety at this time. Pt then returned to room and remained in w/c with family present to await next session.  Pt was left with all needs in reach and alarm active.   Therapy Documentation Precautions:  Precautions Precautions: Fall,  Back Precaution Booklet Issued: Yes (comment) Precaution Comments: reviewed BLT Required Braces or Orthoses: Spinal Brace Spinal Brace: Thoracolumbosacral orthotic, Applied in sitting position Restrictions Weight Bearing Restrictions: No General:       Therapy/Group: Individual Therapy  Mickel Fuchs 07/19/2021, 10:30 AM

## 2021-07-19 NOTE — Progress Notes (Signed)
Occupational Therapy Session Note  Patient Details  Name: Chase Evans MRN: [id number] Date of Birth: [birth year]  Today's Date: 07/19/2021 OT Individual Time: 1100-1155 OT Individual Time Calculation (min): 55 min    Short Term Goals: Week 1:  OT Short Term Goal 1 (Week 1): Pt to be Min A for LB bathing/dressing with AE as needed and LRD OT Short Term Goal 2 (Week 1): Pt to be Min A for UB dressing OT Short Term Goal 3 (Week 1): Pt to be Min A for toileting with LRD OT Short Term Goal 4 (Week 1): Pt to be CGA for functional transfers with LRD OT Short Term Goal 5 (Week 1): Pt to improve UE strength to 4/5 to complete functional transfers  Skilled Therapeutic Interventions/Progress Updates:    Pt sitting in w/c upon arrival with sons (2) and DIL present for education. Reviewed recommendation for 24 hours supervision. Reviewed TLSO/back precautions. Previewed assistance levels for dressing/bathing (supervision). Pt practiced walk-in shower transfers with RW. All transfers and amb in room with RW at supervision level. Discussed placement of BSC at night or use of urinal during night. Also recommended using Depends when at home. Pt in agreement. Pt will be discharging to son and DIL home. BSC had been delivered and shower has shower seat with arm rests. Family verbalized understanding of all recommendations. Pt remained seated in w/c with belt alarm activated. Family present.   Therapy Documentation Precautions:  Precautions Precautions: Fall, Back Precaution Booklet Issued: Yes (comment) Precaution Comments: reviewed BLT Required Braces or Orthoses: Spinal Brace Spinal Brace: Thoracolumbosacral orthotic, Applied in sitting position Restrictions Weight Bearing Restrictions: No  Pain:  Pt denies pain this monring   Therapy/Group: Individual Therapy  Leroy Libman 07/19/2021, 12:05 PM

## 2021-07-19 NOTE — Progress Notes (Addendum)
Speech Language Pathology Daily Session Note  Patient Details  Name: Chase Evans MRN: [id number] Date of Birth: [birth year]  Today's Date: 07/19/2021 SLP Individual Time: 1007-1047 SLP Individual Time Calculation (min): 40 min  Short Term Goals: Week 1: SLP Short Term Goal 1 (Week 1): STG = LTG d/t ELOS  Skilled Therapeutic Interventions: S: Pt seen this date for skilled ST intervention targeting cognitive-linguistic goals outlined in care plan. Pt received awake/alert and OOB in w/c. Family present for family education (2 sons and daughter-in-law). No c/o pain. Agreeable to ST intervention in hospital room. Participatory and cooperative throughout today's session.   O: Education completed re: recommendation for 24/7 supervision and assistance with completion of all iADL tasks to include medication and financial management, as well as simple cooking. Reviewed ST POC and pt's current deficits to include in delayed recall. Reviewed the importance of allowing some level of independence with use of external memory aids to include written reminders, alarms, timers, calendars, and verbal cues as needed. Reviewed pill organizer task and how to facilitate pt independence (allowing pt to place pills in box) while also maintaining pt safety (double checking for accuracy and setting task up to mitigate possibility for errors). Pt's family verbalized understanding and actively participated in education. Pt required Mod-Max A to complete pill organizer task this date. Pt verbalized changes in cognitive status since CIR admission, though required Mod A to facilitate awareness of current deficits and how they will impact his daily life.  A: Pt remains somewhat stimulable for skilled ST intervention targeting functional recall, sustained attention, and problem-solving. Continue to recommend skilled ST intervention to maximize pt's independence and decrease caregiver burden in preparation for upcoming  discharge home. Recommend 24/7 supervision and assistance at discharge for iADL completion. Family notified of this recommendation and is amenable to providing this level of support.  P: Pt left in w/c with family present and all safety measures activated. Call bell within reach and all immediate needs met. Continue per current ST POC.  Pain Denies pain; NAD  Therapy/Group: Individual Therapy  Kayhan Boardley A Breion Novacek 07/19/2021, 1:38 PM

## 2021-07-19 NOTE — Progress Notes (Signed)
PROGRESS NOTE   Subjective/Complaints:  Pt up at EOB. Family in room. Reports that he's been emptying his bladder a lot. No caths since 0400 on 7/2. Had bm last night  ROS: Patient denies fever, rash, sore throat, blurred vision, dizziness, nausea, vomiting, diarrhea, cough, shortness of breath or chest pain, joint or neck pain, headache, or mood change.    Objective:   No results found. Recent Labs    07/19/21 0730  WBC 13.1*  HGB 10.6*  HCT 31.7*  PLT 341   Recent Labs    07/19/21 0731  NA 139  K 3.2*  CL 105  CO2 18*  GLUCOSE 114*  BUN 59*  CREATININE 5.61*  CALCIUM 8.5*    Intake/Output Summary (Last 24 hours) at 07/19/2021 1110 Last data filed at 07/19/2021 0815 Gross per 24 hour  Intake 480 ml  Output 27 ml  Net 453 ml        Physical Exam: Vital Signs Blood pressure 104/71, pulse (!) 110, temperature 98.2 F (36.8 C), temperature source Oral, resp. rate 18, height 5\' 7"  (1.702 m), weight 71.4 kg, SpO2 98 %.  Constitutional: No distress . Vital signs reviewed. HEENT: NCAT, EOMI, oral membranes moist Neck: supple Cardiovascular: RRR without murmur. No JVD    Respiratory/Chest: CTA Bilaterally without wheezes or rales. Normal effort    GI/Abdomen: BS +, non-tender, non-distended Ext: no clubbing, cyanosis, or edema Psych: pleasant and cooperative  Neurologic: Cranial nerves II through XII intact, motor strength is 5/5 in bilateral deltoid, bicep, tricep, grip, 4/5 hip flexor, knee extensors, ankle dorsiflexor and plantar flexor. Good sitting balance. Back brace on bed.  Sensory exam normal sensation to light touch  in bilateral upper and lower extremities  Musculoskeletal: in TLSO when up to limit spine ROM    Assessment/Plan: 1. Functional deficits which require 3+ hours per day of interdisciplinary therapy in a comprehensive inpatient rehab setting. Physiatrist is providing close team  supervision and 24 hour management of active medical problems listed below. Physiatrist and rehab team continue to assess barriers to discharge/monitor patient progress toward functional and medical goals  Care Tool:  Bathing    Body parts bathed by patient: Right arm, Left arm, Chest, Abdomen, Front perineal area, Right upper leg, Left upper leg, Face, Buttocks, Right lower leg, Left lower leg   Body parts bathed by helper: Buttocks, Right lower leg, Left lower leg     Bathing assist Assist Level: Contact Guard/Touching assist     Upper Body Dressing/Undressing Upper body dressing   What is the patient wearing?: Pull over shirt, Orthosis Orthosis activity level: Performed by helper  Upper body assist Assist Level: Minimal Assistance - Patient > 75%    Lower Body Dressing/Undressing Lower body dressing      What is the patient wearing?: Pants, Underwear/pull up     Lower body assist Assist for lower body dressing: Contact Guard/Touching assist     Toileting Toileting Toileting Activity did not occur (Clothing management and hygiene only): N/A (no void or bm)  Toileting assist Assist for toileting: Moderate Assistance - Patient 50 - 74% (anticipated level based on observed performance)     Transfers Chair/bed  transfer  Transfers assist     Chair/bed transfer assist level: Minimal Assistance - Patient > 75%     Locomotion Ambulation   Ambulation assist      Assist level: Contact Guard/Touching assist Assistive device: Walker-rolling Max distance: 150'   Walk 10 feet activity   Assist     Assist level: Contact Guard/Touching assist Assistive device: Walker-rolling   Walk 50 feet activity   Assist    Assist level: Contact Guard/Touching assist Assistive device: Walker-rolling    Walk 150 feet activity   Assist    Assist level: Contact Guard/Touching assist Assistive device: Walker-rolling    Walk 10 feet on uneven surface   activity   Assist Walk 10 feet on uneven surfaces activity did not occur: Safety/medical concerns   Assist level: Minimal Assistance - Patient > 75% Assistive device: Walker-rolling   Wheelchair     Assist Is the patient using a wheelchair?: Yes Type of Wheelchair: Manual    Wheelchair assist level: Dependent - Patient 0% Max wheelchair distance: 150'    Wheelchair 50 feet with 2 turns activity    Assist        Assist Level: Dependent - Patient 0%   Wheelchair 150 feet activity     Assist      Assist Level: Dependent - Patient 0%   Blood pressure 104/71, pulse (!) 110, temperature 98.2 F (36.8 C), temperature source Oral, resp. rate 18, height 5\' 7"  (1.702 m), weight 71.4 kg, SpO2 98 %.  Medical Problem List and Plan: 1. 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   -Continue CIR therapies including PT, OT, and SLP. Family ed today. They're happy that he's voiding now. 2. -DVT/anticoagulation:  Mechanical: Sequential compression devices, below knee Bilateral lower extremities             -antiplatelet therapy: N/a 3. Pain Management: Tylenol prn, stop cyclobenzaprine  6/26- usually controlled- con't regimen as it is-   7/3- pt denies pain- con't regimen 4. Mood/Sleep: Monitor sleep wake cycle with sleep chart.             --LCSW to follow for evaluation and support when appropriate.              -antipsychotic agents: none  5. Neuropsych/cognition: This patient is not capable of making decisions on his own behalf. Has hx of dementia with sundowning.  6. Skin/Wound Care: Monitor back incision for healing. Routine pressure relief measures. 7.  Fluids/Electrolytes/Nutrition: Strict I/O. Encourage fluid intake             -foley for now  6/26- pt pulled himself last night- will do I/o caths as required  6/30- still requiring caths, but on max dose of Flomax-   7/3 now voiding. Encouraged him to make sure all voids are on toilet or BSC. 8. Acute blood loss anemia: Monitor H/H with serial checks on oral FeSO4             --Supplement with IV iron x 2 more days  and weekly aranesp 9. Acute on chronic renal  failure: Monitor renal status             --avoid nephrotoxic drugs. IVF boluses prn?             --needs strict I/O  6/28- Cr pretty stable last few days- ~ 5-5.25 6/30- Cr up to 5.5- but overall pretty stable- - 7/3- Cr 5.6, sl elevated compared to recent numbers but probably within his baseline (was 5.8 on 6/22)  -will recheck tomorrow to see if there's further trend 10. BPH/Neurogenic bladder: Foley to help bladder decompress for a few days.             --Increase Flomax to 0.8mg /hs             --voiding trial early next week  6/26- pt pulled his foley out- will do I/o caths q6 hours base don bladder scans.   6/27- voiding some, but also needing some caths- con't regimen  7/3 now voiding. Up to toilet/bsc. Double voids if possible 11. Neurogenic bowel: Has had 2 documented BM since admission?             --Last BM after suppository. Will start daily suppository after supper.  12. HTN: Monitor BP TID--continue low dose Norvasc Vitals:   07/18/21 1930 07/19/21 0612  BP: (!) 157/79 104/71  Pulse: 100 (!) 110  Resp: 20 18  Temp: 98.6 F (37 C) 98.2 F (36.8 C)  SpO2: 98% 98%   Elevated systolic will increase norvasc    6/26- BP still elevated, but got his first dose of increased dose this AM  6/27- will give Hydralazine 10 mg TID for now- since BP in 170s-180s range- will monitor trend  6/29- Will increase Hydralazine to 25 mg TID since BP still 633H-545G systolic.   6/30- increased Hydralazine to 25 mg TID- now in 256'L  systolic which is improved- con't to monitor  7/1- BP much better- 140s-150s-con't regimen and monitor trend   7/3- bp better with Metoprolol 12.5 mg BID. HR still up in 100's   -observe today 13. Leukocytosis  6/26- WBC up from 12.2 to 14.9k- pt acting more confused- pulled foley overnight- will check U/A and Cx- by cath and if doesn't have UTI (I think he does due to neurogenic bladder issues) then will complete additional ID work up  6/27- no UTI and no pneumonia- blood Cx's and Dopplers pending. If dopplers (-), will need to start broad spectrum ABX- no fever, but very sleepy- vitals OK otherwise. . Will check labs daily for the next 3 days-   6/28- WBC stable at 15k- Dopplers (-)- CXR and U/A (-)- blood Cxs are pending- but negative so far- did Head CT due to accompanying confusion- head Ct shows nothing acute- pt is afebrile and no other signs of infection- lactate 0.6- normal range- will con't daily labs and wait for blood Cx's-   6/29- Head Ct (-) for acute issues; WBC today is stable at 14.9 with mild l shift that is slightly improving-blood C'x (-) x2 days so far.  Wait on IV ABX, since only sign of infection is WBC, but otherwise, no other signs.   7/3 wbc's stable to decreased 13.1 14. Tachycardia  6/30- sounds regular- doesn't have any SOB; O2 sats 98-100%- no reason to think has PE- will con't to push fluids-and monitor trend.   7/1- if doesn't improve, might need a low dose b blocker-   7/2- hasn't improved- running ~ 110s on exam- will start Metoprolol 12.5 mg BID  since BP still also elevated in 694W systolic.   7/3 -remains in regular rhythm. Still tachy in 100's.    -observe today on metoprolol. Bp might not tolerate further titration       LOS: 9 days A FACE TO FACE EVALUATION WAS PERFORMED  Meredith Staggers 07/19/2021, 11:10 AM

## 2021-07-19 NOTE — Progress Notes (Signed)
Initial Nutrition Assessment  DOCUMENTATION CODES:   Non-severe (moderate) malnutrition in context of chronic illness  INTERVENTION:   Renal Multivitamin w/ minerals daily Magic cup BID with meals, each supplement provides 290 kcal and 9 grams of protein Encourage good PO intake   NUTRITION DIAGNOSIS:   Moderate Malnutrition related to chronic illness (CKD, dementia) as evidenced by moderate muscle depletion, mild fat depletion.  GOAL:   Patient will meet greater than or equal to 90% of their needs  MONITOR:   PO intake, Labs, Weight trends, I & O's  REASON FOR ASSESSMENT:   Malnutrition Screening Tool    ASSESSMENT:   81 y.o. male admitted to CIR for functional decline 2/2 lumbar myelopathy. PMH includes CKD V, HTN, and dementia.  Pt resting in bed at time of RD visit.  Pt endorses a poor appetite and states that he just does not eat like he use to. Does not provide why his appetite is poor. Was not able to provide this RD with his favorite food(s) when asked. Reports that he will occassionally drink and Ensure, but declined when RD offered to order.  Per EMR, pt PO intake includes 0-100%, with an average of 28%.  Pt endorses weight loss, but is able to provide an UBW of 160# (current weight 157#).   RD reviewed menu and alternative options available. RD discussed importance of proper nutrition to prevent loss of lean muscle mass.   Medications reviewed and include: Colace, Ferrous Sulfate, Senokot Labs reviewed: Potassium 3.2   NUTRITION - FOCUSED PHYSICAL EXAM:  Flowsheet Row Most Recent Value  Orbital Region Mild depletion  Upper Arm Region No depletion  Thoracic and Lumbar Region No depletion  Buccal Region Mild depletion  Temple Region No depletion  Clavicle Bone Region Mild depletion  Clavicle and Acromion Bone Region Mild depletion  Scapular Bone Region Mild depletion  Dorsal Hand Moderate depletion  Patellar Region Moderate depletion  Anterior Thigh  Region Moderate depletion  Posterior Calf Region Moderate depletion  Edema (RD Assessment) None  Hair Reviewed  Eyes Reviewed  Mouth Reviewed  Skin Reviewed  Nails Reviewed    Diet Order:   Diet Order             Diet regular Room service appropriate? Yes; Fluid consistency: Thin  Diet effective now                   EDUCATION NEEDS:   Not appropriate for education at this time  Skin:  Skin Assessment: Reviewed RN Assessment  Last BM:  7/2  Height:   Ht Readings from Last 1 Encounters:  07/12/21 5\' 7"  (1.702 m)    Weight:   Wt Readings from Last 1 Encounters:  07/17/21 71.4 kg    Ideal Body Weight:  67.3 kg  BMI:  Body mass index is 24.65 kg/m.  Estimated Nutritional Needs:   Kcal:  1700-1900  Protein:  85-100 grams  Fluid:  >/= 1.7 L    Hermina Barters RD, LDN Clinical Dietitian See [facility] for contact information.

## 2021-07-19 NOTE — Progress Notes (Signed)
Occupational Therapy Session Note  Patient Details  Name: Chase Evans MRN: [id number] Date of Birth: 12/16/1940  Today's Date: 07/19/2021 OT Individual Time: 7943-2761 OT Individual Time Calculation (min): 43 min    Short Term Goals: Week 1:  OT Short Term Goal 1 (Week 1): Pt to be Min A for LB bathing/dressing with AE as needed and LRD OT Short Term Goal 2 (Week 1): Pt to be Min A for UB dressing OT Short Term Goal 3 (Week 1): Pt to be Min A for toileting with LRD OT Short Term Goal 4 (Week 1): Pt to be CGA for functional transfers with LRD OT Short Term Goal 5 (Week 1): Pt to improve UE strength to 4/5 to complete functional transfers  Skilled Therapeutic Interventions/Progress Updates:    Pt resting in w/c upon arrival with family present. Family left at beginning of session. OT intervention with focus on functional amb with RW, sit<>stand, standing balance, safety awareness, and bed mobility. Pt amb with RW to day room with supervision. Max verbal cues for RW safety-standing closer to RW and keeping RW in front when making turns. 7 mins Nustep Level 4. Pt amb with RW  around nursing station and back to day room X 2 with min  verbal cues for safety. Pt with significantly rounded shoulders when amb. Pt amb with RW back to room and sat EOB. Pt attempted to push RW aside and walk without RW. Reinforced importance of always using RW. Pt verbalized understanding. Dependent for doffing TLSO. Supervisoin for sit>supine. Bed alarm activated. All needs within reach.   Therapy Documentation Precautions:  Precautions Precautions: Fall, Back Precaution Booklet Issued: Yes (comment) Precaution Comments: reviewed BLT Required Braces or Orthoses: Spinal Brace Spinal Brace: Thoracolumbosacral orthotic, Applied in sitting position Restrictions Weight Bearing Restrictions: No Pain:  Pt denies pain this afternoon  Therapy/Group: Individual Therapy  Leroy Libman 07/19/2021, 2:36  PM

## 2021-07-20 DIAGNOSIS — E440 Moderate protein-calorie malnutrition: Secondary | ICD-10-CM | POA: Insufficient documentation

## 2021-07-20 LAB — BASIC METABOLIC PANEL
Anion gap: 17 — ABNORMAL HIGH (ref 5–15)
BUN: 63 mg/dL — ABNORMAL HIGH (ref 8–23)
CO2: 16 mmol/L — ABNORMAL LOW (ref 22–32)
Calcium: 8.4 mg/dL — ABNORMAL LOW (ref 8.9–10.3)
Chloride: 106 mmol/L (ref 98–111)
Creatinine, Ser: 5.55 mg/dL — ABNORMAL HIGH (ref 0.61–1.24)
GFR, Estimated: 10 mL/min — ABNORMAL LOW (ref >60)
Glucose, Bld: 89 mg/dL (ref 70–99)
Potassium: 3.5 mmol/L (ref 3.5–5.1)
Sodium: 139 mmol/L (ref 135–145)

## 2021-07-20 MED ORDER — TAMSULOSIN HCL 0.4 MG PO CAPS
0.4000 mg | ORAL_CAPSULE | Freq: Every day | ORAL | 0 refills | Status: DC
  Filled 2021-07-20: qty 60, 60d supply, fill #0

## 2021-07-20 MED ORDER — CLOPIDOGREL BISULFATE 75 MG PO TABS
75.0000 mg | ORAL_TABLET | Freq: Every day | ORAL | 0 refills | Status: AC
Start: 2021-07-20 — End: ?
  Filled 2021-07-20: qty 30, 30d supply, fill #0

## 2021-07-20 MED ORDER — DOCUSATE SODIUM 100 MG PO CAPS
100.0000 mg | ORAL_CAPSULE | Freq: Two times a day (BID) | ORAL | 0 refills | Status: AC
  Filled 2021-07-20: qty 60, 30d supply, fill #0

## 2021-07-20 MED ORDER — ACETAMINOPHEN 325 MG PO TABS
325.0000 mg | ORAL_TABLET | ORAL | 0 refills | Status: AC | PRN
  Filled 2021-07-20: qty 100, 9d supply, fill #0

## 2021-07-20 MED ORDER — METOPROLOL TARTRATE 25 MG PO TABS
12.5000 mg | ORAL_TABLET | Freq: Two times a day (BID) | ORAL | 0 refills | Status: AC
  Filled 2021-07-20: qty 30, 30d supply, fill #0

## 2021-07-20 MED ORDER — MEGESTROL ACETATE 400 MG/10ML PO SUSP
800.0000 mg | Freq: Every day | ORAL | 0 refills | Status: AC
  Filled 2021-07-20: qty 480, 24d supply, fill #0

## 2021-07-20 MED ORDER — HYDRALAZINE HCL 25 MG PO TABS
25.0000 mg | ORAL_TABLET | Freq: Three times a day (TID) | ORAL | 0 refills | Status: AC
Start: 2021-07-20 — End: ?
  Filled 2021-07-20: qty 90, 30d supply, fill #0

## 2021-07-20 MED ORDER — SENNOSIDES-DOCUSATE SODIUM 8.6-50 MG PO TABS
1.0000 | ORAL_TABLET | Freq: Every day | ORAL | 0 refills | Status: AC
  Filled 2021-07-20: qty 30, 30d supply, fill #0

## 2021-07-20 MED ORDER — ATORVASTATIN CALCIUM 10 MG PO TABS
10.0000 mg | ORAL_TABLET | Freq: Every day | ORAL | 0 refills | Status: AC
  Filled 2021-07-20: qty 30, 30d supply, fill #0

## 2021-07-20 MED ORDER — ALLOPURINOL 100 MG PO TABS
100.0000 mg | ORAL_TABLET | Freq: Every day | ORAL | 0 refills | Status: AC
  Filled 2021-07-20: qty 30, 30d supply, fill #0

## 2021-07-20 MED ORDER — RENA-VITE PO TABS
1.0000 | ORAL_TABLET | Freq: Every day | ORAL | 0 refills | Status: AC
  Filled 2021-07-20: qty 30, 30d supply, fill #0

## 2021-07-20 MED ORDER — MELATONIN 3 MG PO TABS
3.0000 mg | ORAL_TABLET | Freq: Every day | ORAL | 0 refills | Status: AC
  Filled 2021-07-20: qty 30, 30d supply, fill #0

## 2021-07-20 MED ORDER — MEGESTROL ACETATE 400 MG/10ML PO SUSP
800.0000 mg | Freq: Every day | ORAL | Status: DC
  Administered 2021-07-20 – 2021-07-21 (×2): 800 mg via ORAL
  Filled 2021-07-20 (×2): qty 20

## 2021-07-20 MED ORDER — BOOST / RESOURCE BREEZE PO LIQD CUSTOM
1.0000 | Freq: Three times a day (TID) | ORAL | Status: DC
  Administered 2021-07-20: 1 via ORAL

## 2021-07-20 MED ORDER — AMLODIPINE BESYLATE 5 MG PO TABS
5.0000 mg | ORAL_TABLET | Freq: Every day | ORAL | 0 refills | Status: AC
  Filled 2021-07-20: qty 30, 30d supply, fill #0

## 2021-07-20 NOTE — Progress Notes (Incomplete)
Inpatient Rehabilitation Discharge Medication Review by a Pharmacist  A complete drug regimen review was completed for this patient to identify any potential clinically significant medication issues.  High Risk Drug Classes Is patient taking? Indication by Medication  Antipsychotic No   Anticoagulant No   Antibiotic No   Opioid No   Antiplatelet Yes Plavix- stroke prophylaxis  Hypoglycemics/insulin No   Vasoactive Medication Yes Norvasc, Apresoline- hypertension Lopressor- tachycardia Flomax- BPH Megace- appetite stimulant  Chemotherapy No   Other Yes Zyloprim- gout Lipitor- HLD Melatonin- sleep Renal MVI- supplement     Type of Medication Issue Identified Description of Issue Recommendation(s)  Drug Interaction(s) (clinically significant)     Duplicate Therapy     Allergy     No Medication Administration End Date     Incorrect Dose     Additional Drug Therapy Needed     Significant med changes from prior encounter (inform family/care partners about these prior to discharge).    Other  PTA meds: Lasix 40 mg daily - stopped Gabapentin 600 mg tid - stopped     Clinically significant medication issues were identified that warrant physician communication and completion of prescribed/recommended actions by midnight of the next day:  No   Time spent performing this drug regimen review (minutes):  30 min

## 2021-07-20 NOTE — Discharge Instructions (Addendum)
Inpatient Rehab Discharge Instructions  Augustus Zurawski Discharge date and time:  07/21/21  Activities/Precautions/ Functional Status: Activity: no lifting, driving, or strenuous exercise till cleared by MD Diet: cardiac diet Wound Care: keep wound clean and dry   Functional status:  ___ No restrictions     ___ Walk up steps independently _X__ 24/7 supervision/assistance   ___ Walk up steps with assistance ___ Intermittent supervision/assistance  ___ Bathe/dress independently ___ Walk with walker     ___ Bathe/dress with assistance ___ Walk Independently    ___ Shower independently ___ Walk with assistance    _X__ Shower with assistance _X__ No alcohol     ___ Return to work/school ________   Special Instructions: Need to wear brace when at edge of bed or out of bed. Do not bend, twist or lift items over 5 lbs.      COMMUNITY REFERRALS UPON DISCHARGE:    Home Health:   PT,   OT   AND RN                 Agency:[facility]    Phone: [phone]   Medical Equipment/Items [place] [facility] 3 IN 1                                                 Agency/Supplier:[facility]   [phone]    My questions have been answered and I understand these instructions. I will adhere to these goals and the provided educational materials after my discharge from the hospital.  Patient/Caregiver Signature _______________________________ Date __________  Clinician Signature _______________________________________ Date __________  Please bring this form and your medication list with you to all your follow-up doctor's appointments.

## 2021-07-20 NOTE — Progress Notes (Signed)
Occupational Therapy Session Note  Patient Details  Name: Chase Evans MRN: [id number] Date of Birth: [birth year]  Today's Date: 07/20/2021 OT Individual Time: 1400-1415 OT Individual Time Calculation (min): 15 min  and Today's Date: 07/20/2021 OT Missed Time: 15 Minutes Missed Time Reason: Patient fatigue   Short Term Goals: Week 1:  OT Short Term Goal 1 (Week 1): Pt to be Min A for LB bathing/dressing with AE as needed and LRD OT Short Term Goal 2 (Week 1): Pt to be Min A for UB dressing OT Short Term Goal 3 (Week 1): Pt to be Min A for toileting with LRD OT Short Term Goal 4 (Week 1): Pt to be CGA for functional transfers with LRD OT Short Term Goal 5 (Week 1): Pt to improve UE strength to 4/5 to complete functional transfers  Skilled Therapeutic Interventions/Progress Updates:    Pt sleeping in bed upon arrival. Mod verbal cues to arouse. Attempted to engage pt and participate in therapy session. Pt finally agreed to sitting EOB but immediately stated he needed to lay back down in bed. Several attempts made but pt not receptive to participating. Pt remained in bed with bed alarm activated. All needs within reach. Pt missed 15 mins skilled OT services.   Therapy Documentation Precautions:  Precautions Precautions: Fall, Back Precaution Booklet Issued: Yes (comment) Precaution Comments: reviewed BLT Required Braces or Orthoses: Spinal Brace Spinal Brace: Thoracolumbosacral orthotic, Applied in sitting position Restrictions Weight Bearing Restrictions: No General: General OT Amount of Missed Time: 15 Minutes Pain: Pain Assessment Pain Scale: 0-10 Pain Score: 0-No pain   Therapy/Group: Individual Therapy  Leroy Libman 07/20/2021, 2:18 PM

## 2021-07-20 NOTE — Progress Notes (Signed)
Inpatient Rehabilitation Care Coordinator Discharge Note   Patient Details  Name: Chase Evans MRN: [id number] Date of Birth: Feb 14, 1940   Discharge location: GOING TO [facility] IN [place]  Length of Stay: 11 DAYS  Discharge activity level: SUPERVISION LEVEL  Home/community participation: ACTIVE  Patient response SW:[id number] Literacy - How often do you need to have someone help you when you read instructions, pamphlets, or other written material from your doctor or pharmacy?: Never  Patient response MB:[id number] Isolation - How often do you feel lonely or isolated from those around you?: Never  Services provided included: MD, RD, PT, OT, SLP, RN, CM, Pharmacy, SW  Financial Services:  Financial Services Utilized: Medicare    Choices offered to/list presented to: SON'S  Follow-up services arranged:  Home Health, DME, Patient/Family has no preference for HH/DME agencies [place]: [facility]-PT, OT RN    DME : [facility] [place] [facility] IN [facility]    Patient response to transportation need: Is the patient able to respond to transportation needs?: Yes In the past 12 months, has lack of transportation kept you from medical appointments or from getting medications?: No In the past 12 months, has lack of transportation kept you from meetings, work, or from getting things needed for daily living?: No    Comments (or additional information):SON'S AND DAUGH[facility] WERE HERE FOR HANDS ON EDUCATION AND AWARE OF DAD'S CARE NEEDS AND PLAN TO PROVIDE THIS. PLAN TO GO TO [facility] AT [place]  Patient/Family verbalized understanding of follow-up arrangements:  Yes  Individual responsible for coordination of the follow-up plan: JR-SON [phone]  Confirmed correct DME delivered: Elease Hashimoto 07/20/2021    Shahidah Nesbitt, Gardiner Rhyme

## 2021-07-20 NOTE — Patient Care Conference (Signed)
Inpatient RehabilitationTeam Conference and Plan of Care Update Date: 07/20/2021   Time: 11:10 AM    Patient Name: Chase Evans      Medical Record Number: [id number]  Date of Birth: 1940-03-20 Sex: Male         Room/Bed: 4W13C/4W13C-01 Payor Info: Payor: MEDICARE / Plan: MEDICARE PART A AND B / Product Type: *No Product type* /    Admit Date/Time:  07/10/2021  3:28 PM  Primary Diagnosis:  Lumbar disc disorder with myelopathy  Hospital Problems: Principal Problem:   Lumbar disc disorder with myelopathy Active Problems:   Malnutrition of moderate degree    Expected Discharge Date: Expected Discharge Date: 07/21/21  Team Members Present: Physician leading conference: Dr. Courtney Heys Social Worker Present: Ovidio Kin, LCSW Nurse Present: Dorthula Nettles, RN PT Present: Ailene Rud, PT OT Present: Roanna Epley, Nanci Pina, OT SLP Present: Helaine Chess, SLP     Current Status/Progress Goal Weekly Team Focus  Bowel/Bladder   Continent b/b, voiding low amounts, lbm 7/4  remain continent,  bladder scan, toilet while awake and prn   Swallow/Nutrition/ Hydration   N/A  N/A  N/A   ADL's   bathing EOB-supervision; dressing-supervision; functional transers-supervision; min verbal cues for task initiation and sequencing  supervision overall  dishcarge home 7/5   Mobility   supervision bed mobility, transfers, and gait up to 150 ft. At goal level  Supervision transfers and mobility, min A stairs  d/c planning   Communication   Sup A  Min A  GOAL MET   Safety/Cognition/ Behavioral Observations  Mod to Max A  Mod A  N/A   Pain   no c/o pain  remain pain levels <3  assess pain qs   Skin   skin intact, back incision healed and open to air  no breakdown  Assess skin qs and prn     Discharge Planning:  Discharging home with son and daughter in-law to [facility]. Both son's and daughter in-law were in for family training on Monday 7/3. Aware of 24/7 care needs.   Team Discussion: Now voiding, nursing to continue PVR's. BP imporoved, Cr at baseline. No signs of PE. Moderate malnutrition. Will order supplements, needs protein. Not recommending to be alone. Will start Cymbalta. Continent B/B, no reported pain. Back incision CDI. Family education completed 07/19/2021.   Patient on target to meet rehab goals: yes, supervision goals. Patient at goal level. CGA stairs. Supervision initiation, orientation moderately impaired. Memory impaired.  *See Care Plan and progress notes for long and short-term goals.   Revisions to Treatment Plan:  Finalizing discharge plans.   Teaching Needs: Family education complete   Current Barriers to Discharge: No current barriers  Possible Resolutions to Barriers: All barriers addressed     Medical Summary Current Status: moderate malnutrition to severe- continent of B/B- now voiding- no caths since 7/2- still needs bladder scans  Barriers to Discharge: Decreased family/caregiver support;Home enviroment access/layout;Medication compliance;Nutrition means;Wound care  Barriers to Discharge Comments: did family education yesterday- refused cath education- cognition at baseline Possible Resolutions to Barriers/Weekly Focus: at goal level- needs cues to initiate- d/c- tomorrow-  at min-CGA with therapies; Supervision for SLP/ cognistat- severely impaired memory and moderately impaired judgement and processing- also confabulates some- knows he has memory issues- was 3/12 on COgnistat-   Continued Need for Acute Rehabilitation Level of Care: The patient requires daily medical management by a physician with specialized training in physical medicine and rehabilitation for the following reasons: Direction of  a multidisciplinary physical rehabilitation program to maximize functional independence : Yes Medical management of patient stability for increased activity during participation in an intensive rehabilitation regime.:  Yes Analysis of laboratory values and/or radiology reports with any subsequent need for medication adjustment and/or medical intervention. : Yes   I attest that I was present, lead the team conference, and concur with the assessment and plan of the team.   Cristi Loron 07/20/2021, 2:50 PM

## 2021-07-20 NOTE — Progress Notes (Signed)
Patient ID: Chase Evans, male   DOB: 1940-04-17, 81 y.o.   MRN: [id number]  Met with pt and spoke with son regarding team conference and discharge tomorrow. Son';s and daughter in-law were here for hands on education Monday and all feel comfortable with the care pt requires. [facility] via [facility] and equipment rolling walker and 3 in 1 delivered to room. To go to son's home in [facility] tomorrow.

## 2021-07-20 NOTE — Progress Notes (Signed)
Physical Therapy Session Note  Patient Details  Name: Deo Mehringer MRN: [id number] Date of Birth: January 09, 1941  Today's Date: 07/20/2021 PT Missed Time: 60 Minutes Missed Time Reason: Patient unwilling to participate  Short Term Goals: Week 2:     Skilled Therapeutic Interventions/Progress Updates:     Pt received supine in bed asleep. PT gently rouses pt and pt defers therapy at this time, requesting to sleep, despite encouragement to participate. PT will follow up as able.  Therapy Documentation Precautions:  Precautions Precautions: Fall, Back Precaution Booklet Issued: Yes (comment) Precaution Comments: reviewed BLT Required Braces or Orthoses: Spinal Brace Spinal Brace: Thoracolumbosacral orthotic, Applied in sitting position Restrictions Weight Bearing Restrictions: No   Therapy/Group: Individual Therapy  Breck Coons, PT, DPT 07/20/2021, 3:52 PM

## 2021-07-20 NOTE — Progress Notes (Signed)
Occupational Therapy Discharge Summary  Patient Details  Name: Chase Evans MRN: [id number] Date of Birth: 1940-10-26  Patient has met 12 of 12 long term goals due to {due to:3041651}.  Pt made steady progress with BADLs and functional tranfsers during this admission. Pt requires min verbal cues for task initiation and sequencing but completes all BADLs and IADLs without physical assistance at supervision level. Pt will required 24 hour supervision 2/2 cognitive decline and baseline dementia. Pt's sons and DIL have been present and verbalized understanding of recommendation. Pt to discharge to son's home in [place], [place]. Patient to discharge at overall {LOA:3049010} level.  Patient's care partner {care partner:3041650} to provide the necessary {assistance:3041652} assistance at discharge.    Reasons goals not met: ***  Recommendation:  Patient will benefit from ongoing skilled OT services in {setting:3041680} to continue to advance functional skills in the area of {ADL/iADL:3041649}.  Equipment: BSC  Reasons for discharge: {Reason for discharge:3049018}  Patient/family agrees with progress made and goals achieved: {Pt/Family agree with progress/goals:3049020}  OT Discharge ADL ADL Equipment Provided: Reacher Eating: Set up Grooming: Independent Where Assessed-Grooming: Sitting at sink Upper Body Bathing: Supervision/safety Where Assessed-Upper Body Bathing: Edge of bed Lower Body Bathing: Supervision/safety Where Assessed-Lower Body Bathing: Standing at sink, Edge of bed Upper Body Dressing: Supervision/safety Where Assessed-Upper Body Dressing: Sitting at sink Lower Body Dressing: Supervision/safety Where Assessed-Lower Body Dressing: Sitting at sink, Standing at sink Toileting: Supervision/safety Toilet Transfer: Close supervision Toilet Transfer Method: Counselling psychologist: Energy manager: Close supervision Financial planner Method: Heritage manager: Grab bars, Civil engineer, contracting with back Vision Baseline Vision/History: 1 Wears glasses;6 Macular Degeneration Patient Visual Report: No change from baseline Vision Assessment?: No apparent visual deficits Perception  Perception: Within Functional Limits Praxis Praxis: Intact Cognition Cognition Overall Cognitive Status: Impaired/Different from baseline Arousal/Alertness: Awake/alert Orientation Level: Person;Place;Situation Person: Oriented Place: Oriented Situation: Oriented Memory: Impaired Memory Impairment: Decreased short term memory;Decreased recall of new information;Retrieval deficit Decreased Short Term Memory: Verbal basic;Functional basic Attention: Focused;Sustained Focused Attention: Appears intact Focused Attention Impairment: Functional basic Sustained Attention: Appears intact Sustained Attention Impairment: Functional basic Awareness: Impaired Awareness Impairment: Emergent impairment Problem Solving: Appears intact Problem Solving Impairment: Functional basic Safety/Judgment: Impaired Brief Interview for Mental Status (BIMS) Repetition of Three Words (First Attempt): 3 Temporal Orientation: Year: Correct Temporal Orientation: Month: Accurate within 5 days Temporal Orientation: Day: Correct Recall: "Sock": No, could not recall Recall: "Blue": Yes, no cue required Recall: "Bed": No, could not recall BIMS Summary Score: 11 Sensation Sensation Light Touch: Appears Intact Hot/Cold: Appears Intact Proprioception: Appears Intact Stereognosis: Not tested Coordination Gross Motor Movements are Fluid and Coordinated: Yes Fine Motor Movements are Fluid and Coordinated: Yes Motor  Motor Motor: Abnormal postural alignment and control Motor - Skilled Clinical Observations: impaired secondary to generalized weakness    Trunk/Postural Assessment  Cervical Assessment Cervical Assessment: Exceptions to [facility]  (forward head) Thoracic Assessment Thoracic Assessment: Exceptions to WFL (TLSO, back precautions) Lumbar Assessment Lumbar Assessment: Exceptions to WFL (TLOS, back precautions) Postural Control Postural Control: Deficits on evaluation Righting Reactions: delayed  Balance Static Sitting Balance Static Sitting - Balance Support: Feet supported Static Sitting - Level of Assistance: 5: Stand by assistance Dynamic Sitting Balance Dynamic Sitting - Balance Support: During functional activity Dynamic Sitting - Level of Assistance: 5: Stand by assistance Extremity/Trunk Assessment RUE Assessment RUE Assessment: Within Functional Limits LUE Assessment LUE Assessment: Within Functional Limits   Leroy Libman 07/20/2021, 10:11 AM

## 2021-07-20 NOTE — Plan of Care (Signed)
  Problem: RH Cognition - SLP Goal: RH LTG Patient will demonstrate orientation with cues Description:  LTG:  Patient will demonstrate orientation to person/place/time/situation with cues (SLP)   Outcome: Completed/Met Flowsheets (Taken 07/20/2021 1301) LTG Patient will demonstrate orientation to:  Person  Place  Time  Situation LTG: Patient will demonstrate orientation using cueing (SLP): Moderate Assistance - Patient 50 - 74%   Problem: RH Comprehension Communication Goal: LTG Patient will comprehend basic/complex auditory (SLP) Description: LTG: Patient will comprehend basic/complex auditory information with cues (SLP). Outcome: Completed/Met Flowsheets (Taken 07/20/2021 1301) LTG: Patient will comprehend: Basic auditory information LTG: Patient will comprehend auditory information with cueing (SLP): Modified Independent   Problem: RH Expression Communication Goal: LTG Patient will verbally express basic/complex needs(SLP) Description: LTG:  Patient will verbally express basic/complex needs, wants or ideas with cues  (SLP) Outcome: Completed/Met Flowsheets (Taken 07/20/2021 1301) LTG: Patient will verbally express basic/complex needs, wants or ideas (SLP): Supervision   Problem: RH Problem Solving Goal: LTG Patient will demonstrate problem solving for (SLP) Description: LTG:  Patient will demonstrate problem solving for basic/complex daily situations with cues  (SLP) Outcome: Completed/Met Flowsheets Taken 07/20/2021 1301 by Helaine Chess A, CCC-SLP LTG Patient will demonstrate problem solving for: Moderate Assistance - Patient 50 - 74% Taken 07/13/2021 1038 by Lillie Columbia R, CCC-SLP LTG: Patient will demonstrate problem solving for (SLP): Basic daily situations   Problem: RH Memory Goal: LTG Patient will use memory compensatory aids to (SLP) Description: LTG:  Patient will use memory compensatory aids to recall biographical/new, daily complex information with cues  (SLP) Outcome: Completed/Met Flowsheets (Taken 07/20/2021 1301) LTG: Patient will use memory compensatory aids to (SLP): Moderate Assistance - Patient 50 - 74%   Problem: RH Attention Goal: LTG Patient will demonstrate this level of attention during functional activites (SLP) Description: LTG:  Patient will will demonstrate this level of attention during functional activites (SLP) Outcome: Completed/Met Flowsheets (Taken 07/20/2021 1301) Patient will demonstrate during cognitive/linguistic activities the attention type of:  Focused  Sustained Patient will demonstrate this level of attention during cognitive/linguistic activities in: Controlled LTG: Patient will demonstrate this level of attention during cognitive/linguistic activities with assistance of (SLP): Supervision

## 2021-07-20 NOTE — Progress Notes (Signed)
Occupational Therapy Session Note  Patient Details  Name: Chase Evans MRN: [id number] Date of Birth: Jun 07, 1940  Today's Date: 07/20/2021 OT Individual Time: 7341-9379 OT Individual Time Calculation (min): 58 min    Short Term Goals: Week 1:  OT Short Term Goal 1 (Week 1): Pt to be Min A for LB bathing/dressing with AE as needed and LRD OT Short Term Goal 2 (Week 1): Pt to be Min A for UB dressing OT Short Term Goal 3 (Week 1): Pt to be Min A for toileting with LRD OT Short Term Goal 4 (Week 1): Pt to be CGA for functional transfers with LRD OT Short Term Goal 5 (Week 1): Pt to improve UE strength to 4/5 to complete functional transfers  Skilled Therapeutic Interventions/Progress Updates:    Pt resting in bed upon arrival. Pt agreeabel to getting OOB and washing up before dressing. Supine>sit with supervision. Pt sat EOB to bathe UB and don shirt before donning TLSO. Pt requires assistance donning TLSO. UB dressing with supervision. Pt dons pants seated EOB with sit<>stand to pull over hips. Pt requested to use toilet. Amb with RW to bathroom with supervision. Pt completed toiileting tasks with supervision. Pt amb with RW to sink to wash hands and sat in w/c for brushing teeth. Pt remained seated in w/c with all needs within reach and belt alarm activated.   Therapy Documentation Precautions:  Precautions Precautions: Fall, Back Precaution Booklet Issued: Yes (comment) Precaution Comments: reviewed BLT Required Braces or Orthoses: Spinal Brace Spinal Brace: Thoracolumbosacral orthotic, Applied in sitting position Restrictions Weight Bearing Restrictions: No  Pain:  Pt c/o "burning" in Bil feet; emotional support, MD aware   Therapy/Group: Individual Therapy  Leroy Libman 07/20/2021, 10:13 AM

## 2021-07-20 NOTE — Progress Notes (Signed)
PROGRESS NOTE   Subjective/Complaints:  No caths since 7/2- is voiding.   Sleepy- pt reports no issues.,  Seen by Dietician yesterday- dx'd with moderate malnutrition.  Is eating ~ 28% of meals- trying to push supplements for pt to take- d/w nursing this AM specifically to do so, since rarely eats anything for breakfast.     ROS: limited by cognition Objective:   No results found. Recent Labs    07/19/21 0730  WBC 13.1*  HGB 10.6*  HCT 31.7*  PLT 341   Recent Labs    07/19/21 0731 07/20/21 0533  NA 139 139  K 3.2* 3.5  CL 105 106  CO2 18* 16*  GLUCOSE 114* 89  BUN 59* 63*  CREATININE 5.61* 5.55*  CALCIUM 8.5* 8.4*    Intake/Output Summary (Last 24 hours) at 07/20/2021 0800 Last data filed at 07/19/2021 2136 Gross per 24 hour  Intake 360 ml  Output 200 ml  Net 160 ml        Physical Exam: Vital Signs Blood pressure (!) 141/73, pulse 90, temperature 98.5 F (36.9 C), temperature source Oral, resp. rate 14, height 5\' 7"  (1.702 m), weight 71.4 kg, SpO2 99 %.    General: woke to stimuli- but went back to sleep;  NAD HENT: conjugate gaze; oropharynx moist CV: regular rate; no JVD Pulmonary: CTA B/L; no W/R/R- good air movement GI: soft, NT, ND, (+)BS Psychiatric: calm- sleepy Neurological: poor memory- doesn't remember me- Ox1- but sleepy Neurologic: Cranial nerves II through XII intact, motor strength is 5/5 in bilateral deltoid, bicep, tricep, grip, 4/5 hip flexor, knee extensors, ankle dorsiflexor and plantar flexor. Good sitting balance. Back brace on bed.  Sensory exam normal sensation to light touch  in bilateral upper and lower extremities  Musculoskeletal: in TLSO when up to limit spine ROM    Assessment/Plan: 1. Functional deficits which require 3+ hours per day of interdisciplinary therapy in a comprehensive inpatient rehab setting. Physiatrist is providing close team supervision and 24  hour management of active medical problems listed below. Physiatrist and rehab team continue to assess barriers to discharge/monitor patient progress toward functional and medical goals  Care Tool:  Bathing    Body parts bathed by patient: Right arm, Left arm, Chest, Abdomen, Front perineal area, Right upper leg, Left upper leg, Face, Buttocks, Right lower leg, Left lower leg   Body parts bathed by helper: Buttocks, Right lower leg, Left lower leg     Bathing assist Assist Level: Contact Guard/Touching assist     Upper Body Dressing/Undressing Upper body dressing   What is the patient wearing?: Pull over shirt, Orthosis Orthosis activity level: Performed by helper  Upper body assist Assist Level: Minimal Assistance - Patient > 75%    Lower Body Dressing/Undressing Lower body dressing      What is the patient wearing?: Pants, Underwear/pull up     Lower body assist Assist for lower body dressing: Contact Guard/Touching assist     Toileting Toileting Toileting Activity did not occur (Clothing management and hygiene only): N/A (no void or bm)  Toileting assist Assist for toileting: Moderate Assistance - Patient 50 - 74% (anticipated level based on observed  performance)     Transfers Chair/bed transfer  Transfers assist     Chair/bed transfer assist level: Minimal Assistance - Patient > 75%     Locomotion Ambulation   Ambulation assist      Assist level: Contact Guard/Touching assist Assistive device: Walker-rolling Max distance: 150'   Walk 10 feet activity   Assist     Assist level: Contact Guard/Touching assist Assistive device: Walker-rolling   Walk 50 feet activity   Assist    Assist level: Contact Guard/Touching assist Assistive device: Walker-rolling    Walk 150 feet activity   Assist    Assist level: Contact Guard/Touching assist Assistive device: Walker-rolling    Walk 10 feet on uneven surface  activity   Assist Walk 10  feet on uneven surfaces activity did not occur: Safety/medical concerns   Assist level: Minimal Assistance - Patient > 75% Assistive device: Walker-rolling   Wheelchair     Assist Is the patient using a wheelchair?: Yes Type of Wheelchair: Manual    Wheelchair assist level: Dependent - Patient 0% Max wheelchair distance: 150'    Wheelchair 50 feet with 2 turns activity    Assist        Assist Level: Dependent - Patient 0%   Wheelchair 150 feet activity     Assist      Assist Level: Dependent - Patient 0%   Blood pressure (!) 141/73, pulse 90, temperature 98.5 F (36.9 C), temperature source Oral, resp. rate 14, height 5\' 7"  (1.702 m), weight 71.4 kg, SpO2 99 %.  Medical Problem List and Plan: 1. 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   -Con't CIR- PT, OT and SLP- is voiding now.  2. -DVT/anticoagulation:  Mechanical: Sequential compression devices, below knee Bilateral lower extremities             -antiplatelet therapy: N/a 3. Pain Management: Tylenol prn, stop cyclobenzaprine  6/26- usually controlled- con't regimen as it is-   7/3- pt denies pain- con't regimen 4. Mood/Sleep: Monitor sleep wake cycle with sleep chart.             --LCSW to follow for evaluation and support when appropriate.              -antipsychotic agents: none  5. Neuropsych/cognition: This patient is not capable of making decisions on his own behalf. Has hx of dementia with sundowning.  6. Skin/Wound Care: Monitor back incision for healing. Routine pressure relief measures. 7. Fluids/Electrolytes/Nutrition: Strict I/O. Encourage fluid intake             -foley for now  6/26- pt pulled  himself last night- will do I/o caths as required  6/30- still requiring caths, but on max dose of Flomax-   7/3 now voiding. Encouraged him to make sure all voids are on toilet or BSC.  7/4- no caths since early 7/2- so doing much better- con't to monitor 8. Acute blood loss anemia: Monitor H/H with serial checks on oral FeSO4             --Supplement with IV  iron x 2 more days  and weekly aranesp 9. Acute on chronic renal failure: Monitor renal status             --avoid nephrotoxic drugs. IVF boluses prn?             --needs strict I/O  6/28- Cr pretty stable last few days- ~ 5-5.25 6/30- Cr up to 5.5- but overall pretty stable- - 7/3- Cr 5.6, sl elevated compared to recent numbers but probably within his baseline (was 5.8 on 6/22)  -will recheck tomorrow to see if there's further trend  7/4- Cr back down slightly to 5.55- will push fluids 10. BPH/Neurogenic bladder: Foley to help bladder decompress for a few days.             --Increase Flomax to 0.8mg /hs             --voiding trial early next week  6/26- pt pulled his foley out- will do I/o caths q6 hours base don bladder scans.   6/27- voiding some, but also needing some caths- con't regimen  7/3 now voiding. Up to toilet/bsc. Double voids if possible 11. Neurogenic bowel: Has had 2 documented BM since admission?             --Last BM after suppository. Will start daily suppository after supper.  12. HTN: Monitor BP TID--continue low dose Norvasc Vitals:   07/19/21 2008 07/20/21 0506  BP: (!) 142/80 (!) 141/73  Pulse: 98 90  Resp: 14 14  Temp: 98.4 F (36.9 C) 98.5 F (36.9 C)  SpO2: 98% 99%   Elevated systolic will increase norvasc    6/26- BP still elevated, but got his first dose of increased dose this AM  6/27- will give Hydralazine 10 mg TID for now- since BP in 170s-180s range- will monitor trend  6/29- Will increase Hydralazine to 25 mg TID since BP still [id number] systolic.   6/30- increased Hydralazine to 25 mg TID-  now in 536'I systolic which is improved- con't to monitor  7/1- BP much better- 140s-150s-con't regimen and monitor trend   7/3- bp better with Metoprolol 12.5 mg BID. HR still up in 100's   -observe today 13. Leukocytosis  6/26- WBC up from 12.2 to 14.9k- pt acting more confused- pulled foley overnight- will check U/A and Cx- by cath and if doesn't have UTI (I think he does due to neurogenic bladder issues) then will complete additional ID work up  6/27- no UTI and no pneumonia- blood Cx's and Dopplers pending. If dopplers (-), will need to start broad spectrum ABX- no fever, but very sleepy- vitals OK otherwise. . Will check labs daily for the next 3 days-   6/28- WBC stable at 15k- Dopplers (-)- CXR and U/A (-)- blood Cxs are pending- but negative so far- did Head CT due to accompanying confusion- head Ct shows nothing acute- pt is afebrile and no other signs of infection- lactate 0.6- normal range- will con't daily labs and wait for blood Cx's-   6/29- Head Ct (-) for acute issues; WBC today is stable at 14.9 with mild l shift that is slightly improving-blood C'x (-) x2 days so far.  Wait on IV ABX, since only sign of infection is WBC, but otherwise, no other signs.   7/3 wbc's stable to decreased 13.1 14. Tachycardia  6/30- sounds regular- doesn't have any SOB; O2 sats 98-100%- no reason to think has PE- will con't to push fluids-and monitor trend.   7/1- if  doesn't improve, might need a low dose b blocker-   7/2- hasn't improved- running ~ 110s on exam- will start Metoprolol 12.5 mg BID since BP still also elevated in 419Q systolic.   7/3 -remains in regular rhythm. Still tachy in 100's.    -observe today on metoprolol. Bp might not tolerate further titration  7/4- HR in 90s- and BP slightly elevated in 140s this AM which is unusual for pt- will monitor trend.  15. Moderate malnutrition/poor appetite.   7/4- will add Megace for appetite, since pt is very sensitive to any medicine that could  make him sleepy- so no Periactin or Remeron. Already seen by Dietician yesterday 71. Hypokalemia  7/4- up to 3.5- will recheck Thursday and see if needs repletion.    I spent a total of 36   minutes on total care today- >50% coordination of care- due to d/w team and nursing about intake and team conference.         LOS: 10 days A FACE TO FACE EVALUATION WAS PERFORMED  Mayetta Castleman 07/20/2021, 8:00 AM

## 2021-07-20 NOTE — Progress Notes (Addendum)
Speech Language Pathology Discharge Summary  Patient Details  Name: Chase Evans MRN: [id number] Date of Birth: 1940/11/03  Today's Date: 07/20/2021 SLP Individual Time: 0824-0904 SLP Individual Time Calculation (min): 40 min   Skilled Therapeutic Interventions:   S: Pt seen this date for skilled ST intervention targeting cognitive-linguistic goals outlined in care plan. Pt received awake/alert and accepting medications from LPN at bedside. No c/o pain; however, does report burning sensation in feet (sent Epic chat to MD). Agreeable to ST intervention at bedside.   O: To assess cognitive status post-IPR ST intervention, COGNISTAT administered this date; scores are as follows: Orientation subtest score: 6/12 - moderate impairment Attention subtest score: 6/8 - WFL Verbal repetition subtest score: 12/12 - WFL Naming subtest score: 8/8 - WFL Mental math calculations subtest score: 3/4 - WFL Verbal abstract reasoning subtest score: 6/8 - WFL Delayed recall subtest score: 3/12 - profound impairment Verbal judgment subtest score: 2/6 - moderate impairment  A: Pt to be discharged from ST intervention, this date, in anticipation of upcoming hospital d/c scheduled for 07/21/2021. Has demonstrated much improvement in verbal expression and auditory comprehension, with pt verbally communicating wants and needs with Sup A. Improvement noted in most all cognitive domains with the except of delayed recall, which was likely present PTA given hx of dementia. Family education has been completed and all questions answered. Recommend short-course of HHST and 24/7 supervision/assistance with iADL tasks; they verbalized understanding.  P: Pt left in bed with all safety measures activated. Call bell reviewed and within reach and all immediate needs met. Please see below for discharge summary.   Patient has met 7 of 7 long term goals.  Patient to discharge at overall Mod;Min level.  Reasons goals not met:  N/A   Clinical Impression/Discharge Summary: Pt continues to present with moderate impairment in orientation + verbal judgment, and profound impairment in delayed recall, per COGNISTAT; focused attention, auditory comprehension, verbal repetition, confrontational naming, mental math calculations, and verbal abstract reasoning scores fell within functional range. Suspect pt may have been demonstrating deficits in these domains PTA given hx of dementia at baseline. Impairments likely negatively impacted by prolonged hospitalization. Pt and family education completed with reinforcement re: need for 24/7 supervision and assistance with all iADL tasks upon discharge, to encourage PO intake via nutritional supplementation, if pt is unable to consume solids consistencies during meals, and ensure TSLO is donned OOB. Pt and pt's family verbalized understanding. May benefit from a short-course of ST intervention through [facility] to set-up memory aids and environment to help with recall of importance information at home (e.g. use of telephone in case of emergencies, knowing how to access emergency contact numbers, etc.). All family questions answered.   Care Partner:  Caregiver Able to Provide Assistance: Yes  Type of Caregiver Assistance: Physical;Cognitive  Recommendation:  Home Health SLP;Outpatient SLP;24 hour supervision/assistance  Rationale for SLP Follow Up: Reduce caregiver burden;Maximize cognitive function and independence   Equipment: N/A   Reasons for discharge: Discharged from hospital   Patient/Family Agrees with Progress Made and Goals Achieved: Yes    Basil Buffin A Annison Birchard 07/20/2021, 12:46 PM

## 2021-07-21 ENCOUNTER — Other Ambulatory Visit (HOSPITAL_COMMUNITY): Payer: Self-pay

## 2021-07-21 ENCOUNTER — Telehealth (HOSPITAL_COMMUNITY): Payer: Self-pay | Admitting: Pharmacy Technician

## 2021-07-21 MED ORDER — TAMSULOSIN HCL 0.4 MG PO CAPS
0.8000 mg | ORAL_CAPSULE | Freq: Every day | ORAL | 0 refills | Status: AC
  Filled 2021-07-21: qty 60, 30d supply, fill #0

## 2021-07-21 NOTE — Progress Notes (Signed)
PROGRESS NOTE   Subjective/Complaints:  Pt voiding.  Ready for d/c Per nursing anxious/depressed about going home.  Wanted to prescribe Cymbalta, but cannot due to Cr 5.5  Ate 50% meal this AM  ROS: limited by cognition Objective:   No results found. Recent Labs    07/19/21 0730  WBC 13.1*  HGB 10.6*  HCT 31.7*  PLT 341   Recent Labs    07/19/21 0731 07/20/21 0533  NA 139 139  K 3.2* 3.5  CL 105 106  CO2 18* 16*  GLUCOSE 114* 89  BUN 59* 63*  CREATININE 5.61* 5.55*  CALCIUM 8.5* 8.4*    Intake/Output Summary (Last 24 hours) at 07/21/2021 0832 Last data filed at 07/21/2021 0600 Gross per 24 hour  Intake 240 ml  Output 251 ml  Net -11 ml        Physical Exam: Vital Signs Blood pressure (!) 146/73, pulse 94, temperature 98.7 F (37.1 C), resp. rate 14, height 5\' 7"  (1.702 m), weight 71.4 kg, SpO2 97 %.     General: awake, alert, appropriate, sitting up in bed; eating breakfast- ate 50%; NAD HENT: conjugate gaze; oropharynx moist CV: regular rate; no JVD Pulmonary: CTA B/L; no W/R/R- good air movement GI: soft, NT, ND, (+)BS Psychiatric: appropriate-brighter affect Neurological: Ox1- poor memory- doesn't remember interviewer Neurologic: Cranial nerves II through XII intact, motor strength is 5/5 in bilateral deltoid, bicep, tricep, grip, 4/5 hip flexor, knee extensors, ankle dorsiflexor and plantar flexor. Good sitting balance. Back brace on bed.  Sensory exam normal sensation to light touch  in bilateral upper and lower extremities  Musculoskeletal: in TLSO when up to limit spine ROM    Assessment/Plan: 1. Functional deficits which require 3+ hours per day of interdisciplinary therapy in a comprehensive inpatient rehab setting. Physiatrist is providing close team supervision and 24 hour management of active medical problems listed below. Physiatrist and rehab team continue to assess barriers to  discharge/monitor patient progress toward functional and medical goals  Care Tool:  Bathing    Body parts bathed by patient: Right arm, Left arm, Chest, Abdomen, Front perineal area, Right upper leg, Left upper leg, Face, Buttocks, Right lower leg, Left lower leg   Body parts bathed by helper: Buttocks, Right lower leg, Left lower leg     Bathing assist Assist Level: Supervision/Verbal cueing     Upper Body Dressing/Undressing Upper body dressing   What is the patient wearing?: Pull over shirt, Orthosis Orthosis activity level: Performed by helper  Upper body assist Assist Level: Supervision/Verbal cueing    Lower Body Dressing/Undressing Lower body dressing      What is the patient wearing?: Pants, Underwear/pull up     Lower body assist Assist for lower body dressing: Supervision/Verbal cueing     Toileting Toileting Toileting Activity did not occur (Clothing management and hygiene only): N/A (no void or bm)  Toileting assist Assist for toileting: Supervision/Verbal cueing     Transfers Chair/bed transfer  Transfers assist     Chair/bed transfer assist level: Minimal Assistance - Patient > 75%     Locomotion Ambulation   Ambulation assist      Assist level: Contact Guard/Touching  assist Assistive device: Walker-rolling Max distance: 150'   Walk 10 feet activity   Assist     Assist level: Contact Guard/Touching assist Assistive device: Walker-rolling   Walk 50 feet activity   Assist    Assist level: Contact Guard/Touching assist Assistive device: Walker-rolling    Walk 150 feet activity   Assist    Assist level: Contact Guard/Touching assist Assistive device: Walker-rolling    Walk 10 feet on uneven surface  activity   Assist Walk 10 feet on uneven surfaces activity did not occur: Safety/medical concerns   Assist level: Minimal Assistance - Patient > 75% Assistive device: Walker-rolling   Wheelchair     Assist Is the  patient using a wheelchair?: Yes Type of Wheelchair: Manual    Wheelchair assist level: Dependent - Patient 0% Max wheelchair distance: 150'    Wheelchair 50 feet with 2 turns activity    Assist        Assist Level: Dependent - Patient 0%   Wheelchair 150 feet activity     Assist      Assist Level: Dependent - Patient 0%   Blood pressure (!) 146/73, pulse 94, temperature 98.7 F (37.1 C), resp. rate 14, height 5\' 7"  (1.702 m), weight 71.4 kg, SpO2 97 %.  Medical Problem List and Plan: 1. 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   D/c today- will need f/u with PCP in 1 week to follow up on labs- BMP/CBC- since WBC is still slightly elevated- cannot find out why.  2. -DVT/anticoagulation:  Mechanical: Sequential compression devices, below knee Bilateral lower extremities             -antiplatelet therapy: N/a 3. Pain Management: Tylenol prn, stop cyclobenzaprine  6/26- usually controlled- con't regimen as it is-   7/3- pt denies pain- con't regimen 4. Mood/Sleep: Monitor sleep wake cycle with sleep chart.             --LCSW to follow for evaluation and support when appropriate.              -antipsychotic agents: none  5. Neuropsych/cognition: This patient is not capable of making decisions on his own behalf. Has hx of dementia with sundowning.  6. Skin/Wound Care: Monitor back incision for healing. Routine pressure relief measures. 7. Fluids/Electrolytes/Nutrition: Strict I/O. Encourage fluid intake             -foley for now  6/26- pt pulled himself last night- will do I/o caths as required  6/30- still requiring caths, but on max dose of Flomax-   7/3 now voiding.  Encouraged him to make sure all voids are on toilet or BSC.  7/4- no caths since early 7/2- so doing much better- con't to monitor 8. Acute blood loss anemia: Monitor H/H with serial checks on oral FeSO4             --Supplement with IV iron x 2 more days  and weekly aranesp 9. Acute on chronic renal failure: Monitor renal status             --  avoid nephrotoxic drugs. IVF boluses prn?             --needs strict I/O  6/28- Cr pretty stable last few days- ~ 5-5.25 6/30- Cr up to 5.5- but overall pretty stable- - 7/3- Cr 5.6, sl elevated compared to recent numbers but probably within his baseline (was 5.8 on 6/22)  -will recheck tomorrow to see if there's further trend  7/4- Cr back down slightly to 5.55- will push fluids 10. BPH/Neurogenic bladder: Foley to help bladder decompress for a few days.             --Increase Flomax to 0.8mg /hs             --voiding trial early next week  6/26- pt pulled his foley out- will do I/o caths q6 hours base don bladder scans.   6/27- voiding some, but also needing some caths- con't regimen  7/3 now voiding. Up to toilet/bsc. Double voids if possible 11. Neurogenic bowel: Has had 2 documented BM since admission?             --Last BM after suppository. Will start daily suppository after supper.  12. HTN: Monitor BP TID--continue low dose Norvasc Vitals:   07/20/21 2314 07/21/21 0443  BP: 120/72 (!) 146/73  Pulse: 93 94  Resp: 16 14  Temp: 98 F (36.7 C) 98.7 F (37.1 C)  SpO2: 98% 97%   Elevated systolic will increase norvasc    6/26- BP still elevated, but got his first dose of increased dose this AM  6/27- will give Hydralazine 10 mg TID for now- since BP in 170s-180s range- will monitor trend  6/29- Will increase Hydralazine to 25 mg TID since BP still [id number] systolic.   6/30- increased Hydralazine to 25 mg TID- now in 144'R systolic which is improved- con't to monitor  7/1- BP much better- 140s-150s-con't regimen and monitor trend   7/3- bp  better with Metoprolol 12.5 mg BID. HR still up in 100's   -observe today 13. Leukocytosis  6/26- WBC up from 12.2 to 14.9k- pt acting more confused- pulled foley overnight- will check U/A and Cx- by cath and if doesn't have UTI (I think he does due to neurogenic bladder issues) then will complete additional ID work up  6/27- no UTI and no pneumonia- blood Cx's and Dopplers pending. If dopplers (-), will need to start broad spectrum ABX- no fever, but very sleepy- vitals OK otherwise. . Will check labs daily for the next 3 days-   6/28- WBC stable at 15k- Dopplers (-)- CXR and U/A (-)- blood Cxs are pending- but negative so far- did Head CT due to accompanying confusion- head Ct shows nothing acute- pt is afebrile and no other signs of infection- lactate 0.6- normal range- will con't daily labs and wait for blood Cx's-   6/29- Head Ct (-) for acute issues; WBC today is stable at 14.9 with mild l shift that is slightly improving-blood C'x (-) x2 days so far.  Wait on IV ABX, since only sign of infection is WBC, but otherwise, no other signs.   7/3 wbc's stable to decreased 13.1 14. Tachycardia  6/30- sounds regular- doesn't have any SOB; O2 sats 98-100%- no reason to think has PE- will con't to push fluids-and monitor trend.   7/1- if doesn't improve, might need a low dose b blocker-   7/2- hasn't improved- running ~ 110s on exam- will start Metoprolol 12.5 mg BID since BP still also elevated in  353R systolic.   7/3 -remains in regular rhythm. Still tachy in 100's.    -observe today on metoprolol. Bp might not tolerate further titration  7/4- HR in 90s- and BP slightly elevated in 140s this AM which is unusual for pt- will monitor trend.  15. Moderate malnutrition/poor appetite.   7/4- will add Megace for appetite, since pt is very sensitive to any medicine that could make him sleepy- so no Periactin or Remeron. Already seen by Dietician yesterday 40. Hypokalemia  7/5- will send home- recommend to  recheck by PCP        LOS: 11 days A FACE TO FACE EVALUATION WAS PERFORMED  Raquel Sayres 07/21/2021, 8:32 AM

## 2021-07-21 NOTE — Progress Notes (Signed)
Inpatient Rehabilitation Discharge Medication Review by a Pharmacist  A complete drug regimen review was completed for this patient to identify any potential clinically significant medication issues.  High Risk Drug Classes Is patient taking? Indication by Medication  Antipsychotic No   Anticoagulant No   Antibiotic No   Opioid No   Antiplatelet Yes Clopidogrel: stroke ppx  Hypoglycemics/insulin No   Vasoactive Medication Yes Amlodipine, hydralazine: hypertension Metoprolol: tachycardia Tamsulosin- BPH  Chemotherapy No   Other Yes Allopurinol: gout ppx Docusate, senokot-S: constipation Megace: appetite stimulant Zyloprim- gout Atorvastatin: HLD Melatonin: PRN sleep Renal MVI: supplement     Type of Medication Issue Identified Description of Issue Recommendation(s)  Drug Interaction(s) (clinically significant)     Duplicate Therapy     Allergy     No Medication Administration End Date     Incorrect Dose     Additional Drug Therapy Needed     Significant med changes from prior encounter (inform family/care partners about these prior to discharge).    Other  PTA meds discontinued: Lasix 40 mg daily - stopped Gabapentin 600 mg tid - stopped Communicate medication changes to patient/family at discharge    Clinically significant medication issues were identified that warrant physician communication and completion of prescribed/recommended actions by midnight of the next day:  No  Time spent performing this drug regimen review (minutes):  30 min   Thank you for allowing pharmacy to be a part of this patient's care.  Ardyth Harps, PharmD Clinical Pharmacist

## 2021-07-21 NOTE — Discharge Summary (Signed)
Physician Discharge Summary  Patient ID: Chase Evans MRN: [id number] DOB/AGE: 07/10/1940 81 y.o.  Admit date: 07/10/2021 Discharge date: 07/21/2021  Discharge Diagnoses:  Principal Problem:   Lumbar disc disorder with myelopathy Active Problems:   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HCC)   Hypertension   Acute postoperative anemia due to expected blood loss   Moderate malnutrition (HCC)   Leucocytosis   Inadequate dietary intake   Discharged Condition: good  Significant Diagnostic Studies: CT HEAD WO CONTRAST (5MM)  Result Date: 07/14/2021 CLINICAL DATA:  Delirium.  Lumbar surgery 2 weeks ago. EXAM: CT HEAD WITHOUT CONTRAST TECHNIQUE: Contiguous axial images were obtained from the base of the skull through the vertex without intravenous contrast. RADIATION DOSE REDUCTION: This exam was performed according to the departmental dose-optimization program which includes automated exposure control, adjustment of the mA and/or kV according to patient size and/or use of iterative reconstruction technique. COMPARISON:  Head CT 01/06/2017 and MRI 01/13/2015 FINDINGS: Brain: There is no evidence of an acute cortically based infarct, intracranial hemorrhage, mass, midline shift, or extra-axial fluid collection. There are patchy hypodensities in the cerebral white matter bilaterally which have progressed and are nonspecific but compatible with moderately extensive chronic small vessel ischemic disease. Mild cerebral atrophy is within normal limits for age. Vascular: Calcified atherosclerosis at the skull base. No hyperdense vessel. Skull: No fracture or suspicious osseous lesion. Sinuses/Orbits: Visualized paranasal sinuses and mastoid air cells are clear. Bilateral cataract extraction. Other: None. IMPRESSION: 1. No evidence of acute intracranial abnormality. 2. Moderately extensive chronic small vessel ischemic disease, progressed from 2018. Electronically Signed   By: Logan Bores M.D.   On:  07/14/2021 12:08   VAS [place] LOWER EXTREMITY VENOUS (DVT)  Result Date: 07/13/2021  Lower Venous DVT Study Patient Name:  Chase Evans [facility]  Date of Exam:   07/13/2021 Medical Rec #: [id number]              Accession #:    [id number] Date of Birth: [birth year]               Patient Gender: M Patient Age:   81 years Exam Location:  [facility] Procedure:      VAS [place] LOWER EXTREMITY VENOUS (DVT) Referring Phys: Lauraine Rinne --------------------------------------------------------------------------------  Indications: Swelling.  Performing Technologist: Bobetta Lime BS, RVT  Examination Guidelines: A complete evaluation includes B-mode imaging, spectral Doppler, color Doppler, and power Doppler as needed of all accessible portions of each vessel. Bilateral testing is considered an integral part of a complete examination. Limited examinations for reoccurring indications may be performed as noted. The reflux portion of the exam is performed with the patient in reverse Trendelenburg.  +---------+---------------+---------+-----------+----------+--------------+ RIGHT    CompressibilityPhasicitySpontaneityPropertiesThrombus Aging +---------+---------------+---------+-----------+----------+--------------+ CFV      Full           Yes      Yes                                 +---------+---------------+---------+-----------+----------+--------------+ SFJ      Full                                                        +---------+---------------+---------+-----------+----------+--------------+ FV Prox  Full                                                        +---------+---------------+---------+-----------+----------+--------------+  FV Mid   Full                                                        +---------+---------------+---------+-----------+----------+--------------+ FV DistalFull                                                         +---------+---------------+---------+-----------+----------+--------------+ PFV      Full                                                        +---------+---------------+---------+-----------+----------+--------------+ POP      Full           Yes      Yes                                 +---------+---------------+---------+-----------+----------+--------------+ PTV      Full                                                        +---------+---------------+---------+-----------+----------+--------------+ PERO     Full                                                        +---------+---------------+---------+-----------+----------+--------------+   +---------+---------------+---------+-----------+----------+--------------+ LEFT     CompressibilityPhasicitySpontaneityPropertiesThrombus Aging +---------+---------------+---------+-----------+----------+--------------+ CFV      Full           Yes      Yes                                 +---------+---------------+---------+-----------+----------+--------------+ SFJ      Full                                                        +---------+---------------+---------+-----------+----------+--------------+ FV Prox  Full                                                        +---------+---------------+---------+-----------+----------+--------------+ FV Mid   Full                                                        +---------+---------------+---------+-----------+----------+--------------+  FV DistalFull                                                        +---------+---------------+---------+-----------+----------+--------------+ PFV      Full                                                        +---------+---------------+---------+-----------+----------+--------------+ POP      Full           Yes      Yes                                  +---------+---------------+---------+-----------+----------+--------------+ PTV      Full                                                        +---------+---------------+---------+-----------+----------+--------------+ PERO     Full                                                        +---------+---------------+---------+-----------+----------+--------------+     Summary: BILATERAL: - No evidence of deep vein thrombosis seen in the lower extremities, bilaterally. -No evidence of popliteal cyst, bilaterally.   *See table(s) above for measurements and observations. Electronically signed by Harold Barban MD on 07/13/2021 at 10:47:49 PM.    Final    DG Chest 1 View  Result Date: 07/13/2021 CLINICAL DATA:  Fever EXAM: CHEST  1 VIEW COMPARISON:  02/22/2017 FINDINGS: Heart size within normal limits. Low lung volumes. No focal airspace consolidation, pleural effusion, or pneumothorax. IMPRESSION: No active disease. Electronically Signed   By: Davina Poke D.O.   On: 07/13/2021 08:51   US RENAL  Result Date: 07/07/2021 CLINICAL DATA:  Obstructive uropathy EXAM: RENAL / URINARY TRACT ULTRASOUND COMPLETE COMPARISON:  06/22/2021, 02/06/2014 FINDINGS: Right Kidney: Renal measurements: 8.3 x 4.7 x 3.8 cm = volume: 77.8 mL. Cortex appears echogenic with decreased corticomedullary differentiation. No hydronephrosis. Anechoic cyst containing thin septation upper pole right kidney measures 3.9 cm. 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 Left Kidney: Renal measurements: 9.4 x 6.2 x 4 cm = volume: 121.7 mL. Cortex appears echogenic with decreased corticomedullary differentiation. No hydronephrosis. Previously measured cyst is not well visualized today. Bladder: Appears normal for degree of bladder distention. Other: None. IMPRESSION: 1. No significant hydronephrosis. 2. Echogenic appearing cortex consistent  with medical renal disease 3. Cyst in the upper pole right kidney, no follow-up imaging is recommended Electronically Signed   By: Donavan Foil M.D.   On: 07/07/2021 19:48   Labs:  Basic Metabolic Panel: Recent Labs  Lab 07/15/21 0517 07/16/21 0524 07/19/21 0731 07/20/21 0533  NA 140 140  139 139  K 3.6 3.7 3.2* 3.5  CL 108 107 105 106  CO2 18* 17* 18* 16*  GLUCOSE 81 74 114* 89  BUN 59* 60* 59* 63*  CREATININE 5.23* 5.54* 5.61* 5.55*  CALCIUM 8.5* 8.6* 8.5* 8.4*    CBC: Recent Labs  Lab 07/15/21 0517 07/16/21 0524 07/19/21 0730  WBC 14.9* 13.7* 13.1*  NEUTROABS 11.4* 10.8*  --   HGB 10.6* 11.0* 10.6*  HCT 31.5* 32.3* 31.7*  MCV 91.0 90.7 91.9  PLT 381 389 341    CBG: No results for input(s): "GLUCAP" in the last 168 hours.  Brief HPI:   Chase Evans is a 81 y.o. 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  Course was significant for issues with agitation due to delirium, urinary retention requiring I/O caths, acute on chronic renal failure with rise in SCr to 6.08, sleep-wake disruption as well as acuity on chronic anemia requiring 1 unit PRBC. Nephrology was consulted for input and recommended close monitoring of I/O w/caths prn retention as well as small NS  boluses as needed.  Palliative care consulted to discuss goals of care and family elected on full code.  Therapy was working with patient and he continued to be in limited by weakness with poor posture, cognitive deficits as well as impulsivity.  CIR was recommended due to functional decline.   Hospital Course: Chase Evans was admitted to rehab 07/10/2021 for inpatient therapies to consist of PT, ST and OT at least three hours five days a week. Past admission physiatrist, therapy team and rehab RN have worked together to provide customized collaborative inpatient rehab.   His blood pressures were monitored on TID basis and was noted to be poorly controlled.  Amlodipine was increased to 5 mg, hydralazine was added and titrated upwards with better control.  He was also noted to have tachycardia with activity felt to be in part due to deconditioning low-dose metoprolol was added with improvement in blood pressure as well as heart rate.  Follow-up CBC at admission showed elevated white count.  UA/UC, blood cultures and chest x-ray done all of which were negative for infection.  BLE Dopplers done were negative for DVT.  As patient was afebrile,  he was monitored with serial labs which showed WBC  trending down without addition of antibiotics.   His back incision has been healing well with any signs or symptoms of infection. CT of head was also done due to delirium/cognitive impairments.  This was negative for acute changes.  His mentation has improved and lethargy has resolved.  He has had issues with voiding and Foley placed to help decompress bladder.  Flomax was increased to 0.8 mg however he pulled his Foley out on 06/26 voiding has gradually improved with PVRs around 200 cc.  Family was advised to monitor I's and O's for now and home health RN to follow-up for in and out caths as needed signs of retention.  Nutritional supplements were added due to low calorie malnutrition.  He did report having lack of taste since surgery and Megace was added to help stimulate appetite.  Family was also advised to offer multiple/supplements between meals to help with nutritional status.  He has made steady gains during his rehab stay and supervision is recommended for safety.  He will continue to receive follow up [place] and [place] [facility] after discharge.    Rehab course: During patient's stay in rehab weekly team conferences were held to monitor patient's progress, set goals and discuss barriers to discharge. At admission, patient required ADL tasks and min to mod assist  with mobility.  He exhibited cognitive impairments and recall one-step commands basic biographical questions with 25% accuracy. He has had improvement in activity tolerance, balance, postural control as well as ability to compensate for deficits. He is able to complete ADL tasks with supervision.  He requires supervision for transfers and to ambulate 150' with RW and verbal cues for safety.  He is showing improvement in verbal expression and auditory, attention and is able to express wants and needs with supervision.  He continues to present with moderate impairments in orientation, judgment as well as delayed recall. Family education has been completed regarding all aspects of safety, cognitive assistance and care.   Disposition: Home  Diet: Regular  Special Instructions: Recommend repeat CBC  1-2 weeks to follow-up on WBC/renal status. Do not TLSO when at edge of bed.  Follow-up with nephrology for f/u on CKD.  4.   Discuss questions regarding Plavix with PCP.    Allergies as of 07/21/2021       Reactions   Nsaids Other (See Comments)   Chronic kidney disease   Penicillins Hives   Percocet [oxycodone-acetaminophen] Other (See Comments)   hallucinations   Crestor [rosuvastatin] Other (See Comments)   Muscle pain        Medication List     STOP taking these medications    B-12 2500 MCG Tabs   Darbepoetin Alfa 40 MCG/0.4ML Sosy injection Commonly known as: ARANESP   ferrous sulfate 325 (65 FE) MG tablet   furosemide 40 MG tablet Commonly known as: LASIX   gabapentin 100 MG capsule Commonly known as: NEURONTIN   OMEGA 3 PO   OVER THE COUNTER MEDICATION   OVER THE COUNTER MEDICATION       TAKE these medications    acetaminophen 325 MG tablet Commonly known as: TYLENOL Take 1-2 tablets (325-650 mg total) by mouth every 4 (four) hours as needed for mild pain.   allopurinol 100 MG tablet Commonly known as: ZYLOPRIM Take 1 tablet (100 mg total) by mouth daily.    amLODipine 5 MG tablet Commonly known as: NORVASC Take 1 tablet (5 mg total) by mouth daily. What changed:  medication strength how much to take   atorvastatin 10 MG tablet Commonly known as: LIPITOR Take 1 tablet (10 mg total) by mouth daily at 6 PM.   clopidogrel 75 MG tablet Commonly known as: PLAVIX Take 1 tablet (75 mg total) by mouth daily.   docusate sodium 100 MG capsule Commonly known as: COLACE Take 1 capsule (100 mg total) by mouth 2 (two) times daily.   hydrALAZINE 25 MG tablet Commonly known as: APRESOLINE Take 1 tablet (25 mg total) by mouth every 8 (eight) hours.   megestrol 40 MG/ML suspension Commonly known as: MEGACE Take 20 mLs (800 mg total) by mouth daily.   melatonin 3 MG Tabs tablet Take 1 tablet (3 mg total) by mouth at bedtime.   metoprolol tartrate 25 MG tablet Commonly known as: LOPRESSOR Take 1/2 tablets (12.5 mg total) by mouth 2 (two) times daily.   multivitamin Tabs tablet Take 1 tablet by mouth at bedtime.   PREVAGEN PO Take 1 capsule by mouth daily.   Senexon-S 8.6-50 MG tablet Generic drug: senna-docusate Take 1 tablet by mouth  daily with breakfast.   tamsulosin 0.4 MG Caps capsule Commonly known as: FLOMAX Take 2 capsules (0.8 mg total) by mouth daily. What changed: how much to take   TURMERIC CURCUMIN PO Take 1 capsule by mouth daily.        Follow-up Information     Allean Found, MD Follow up.   Specialty: Family Medicine Why: Call in 1-2 days for post hospital follow up Contact information: [address] [place] [address] [phone]         Courtney Heys, MD Follow up.   Specialty: Physical Medicine and Rehabilitation Why: office will call you with follow up appointment Contact information: [address] [place] [address] [phone]         Thereasa Distance, MD Follow up.   Specialty: Nephrology Why: Call in 1-2 days for post hospital follow up Contact  information: [address] [place] [address] [phone]         Newman Pies, MD Follow up.   Specialty: Neurosurgery Why: Call in 1-2 days for post hospital follow up Contact information: [address] [place] [address] [phone]                 Signed: Bary Leriche 07/22/2021, 8:33 PM

## 2021-07-21 NOTE — Progress Notes (Signed)
Physical Therapy Discharge Summary  Patient Details  Name: Chase Evans MRN: [id number] Date of Birth: 03-07-1940   Patient has met 9 of 9 long term goals due to improved activity tolerance, improved balance, improved postural control, and ability to compensate for deficits.  Patient to discharge at an ambulatory level Supervision.   Patient's care partner is independent to provide the necessary physical and cognitive assistance at discharge. Pt's son and DIL have completed hands-on family education and are safe to assist pt upon d/c home.  Reasons goals not met: Pt has met all rehab goals.  Recommendation:  Patient will benefit from ongoing skilled PT services in home health setting to continue to advance safe functional mobility, address ongoing impairments in endurance, strength, safety, balance, independence with functional mobility, and minimize fall risk.  Equipment: RW  Reasons for discharge: treatment goals met and discharge from hospital  Patient/family agrees with progress made and goals achieved: Yes  PT Discharge Precautions/Restrictions Precautions Precautions: Fall;Back Precaution Comments: reviewed BLT Required Braces or Orthoses: Spinal Brace Spinal Brace: Thoracolumbosacral orthotic;Applied in sitting position Restrictions Weight Bearing Restrictions: No Pain Interference Pain Interference Pain Effect on Sleep: 1. Rarely or not at all Pain Interference with Therapy Activities: 2. Occasionally Pain Interference with Day-to-Day Activities: 2. Occasionally Vision/Perception  Perception Perception: Within Functional Limits Praxis Praxis: Intact  Cognition Overall Cognitive Status: Impaired/Different from baseline Arousal/Alertness: Awake/alert Orientation Level: Oriented to person;Oriented to place;Oriented to time Year: Other (Comment) (2033) Month: July Day of Week: Incorrect Attention: Focused;Sustained Focused Attention: Appears  intact Sustained Attention: Appears intact Selective Attention: Impaired Memory: Impaired Memory Impairment: Storage deficit;Retrieval deficit;Decreased recall of new information;Decreased short term memory Awareness: Impaired Awareness Impairment: Emergent impairment;Intellectual impairment Problem Solving: Impaired Behaviors: Confabulation Safety/Judgment: Impaired Sensation Sensation Light Touch: Appears Intact (does report neuropathy at baseline) Proprioception: Appears Intact Coordination Gross Motor Movements are Fluid and Coordinated: Yes Fine Motor Movements are Fluid and Coordinated: Yes Motor  Motor Motor: Abnormal postural alignment and control Motor - Skilled Clinical Observations: impaired secondary to generalized weakness Motor - Discharge Observations: impaired 2/2 generalized weakness, improved since eval  Mobility Bed Mobility Bed Mobility: Rolling Right;Rolling Left;Supine to Sit;Sit to Supine Rolling Right: Supervision/verbal cueing Rolling Left: Supervision/Verbal cueing Supine to Sit: Supervision/Verbal cueing Sit to Supine: Supervision/Verbal cueing Transfers Transfers: Sit to Stand;Stand Pivot Transfers Sit to Stand: Supervision/Verbal cueing Stand to Sit: Supervision/Verbal cueing Stand Pivot Transfers: Supervision/Verbal cueing Stand Pivot Transfer Details: Verbal cues for sequencing;Verbal cues for technique;Verbal cues for precautions/safety;Verbal cues for safe use of DME/AE Transfer (Assistive device): Rolling walker Locomotion  Gait Ambulation: Yes Gait Assistance: Supervision/Verbal cueing Gait Distance (Feet): 150 Feet Assistive device: Rolling walker Gait Gait: Yes Gait Pattern: Impaired Gait Pattern: Trunk flexed;Narrow base of support Gait velocity: decreased Stairs / Additional Locomotion Stairs: Yes Stairs Assistance: Contact Guard/Touching assist Stair Management Technique: One rail Left;Sideways Number of Stairs: 4 Height of  Stairs: 6 Pick up small object from the floor assist level: Supervision/Verbal cueing Pick up small object from the floor assistive device: RW and Runner, broadcasting/film/video Wheelchair Mobility: No  Trunk/Postural Assessment  Cervical Assessment Cervical Assessment: Exceptions to [facility] (forward head) Thoracic Assessment Thoracic Assessment: Exceptions to [facility] (TLSO; back precautions) Lumbar Assessment Lumbar Assessment: Exceptions to WFL (TLSO; back precautions) Postural Control Postural Control: Deficits on evaluation Righting Reactions: delayed  Balance Balance Balance Assessed: Yes Static Sitting Balance Static Sitting - Balance Support: Feet supported Static Sitting - Level of Assistance: 5: Stand by assistance Dynamic Sitting Balance Dynamic  Sitting - Balance Support: During functional activity Dynamic Sitting - Level of Assistance: 5: Stand by assistance Static Standing Balance Static Standing - Balance Support: Bilateral upper extremity supported;During functional activity Static Standing - Level of Assistance: 5: Stand by assistance Dynamic Standing Balance Dynamic Standing - Balance Support: Bilateral upper extremity supported;During functional activity Dynamic Standing - Level of Assistance: 5: Stand by assistance Extremity Assessment   RLE Assessment RLE Assessment: Within Functional Limits Passive Range of Motion (PROM) Comments: tight HS General Strength Comments: 4 to 5/5 grossly LLE Assessment LLE Assessment: Within Functional Limits Passive Range of Motion (PROM) Comments: tight HS General Strength Comments: 4 to 5/5 grossly     Excell Seltzer, PT, DPT, CSRS 07/21/2021, 11:57 AM

## 2021-07-21 NOTE — Progress Notes (Signed)
INPATIENT REHABILITATION DISCHARGE NOTE   Discharge instructions by: Algis Liming PA-C  Verbalized understanding: done   Skin care/Wound care healing? None   Pain:0/10  IV's: removed  Tubes/Drains: none   O2: room air   Safety instructions: given to daughter  Patient belongings: sent with family   Discharged to: home   Discharged via: wheelchair   Notes:

## 2021-07-21 NOTE — Telephone Encounter (Signed)
Patient Advocate Encounter   Received notification that prior authorization for Megestrol Acetate 400MG /10ML suspension is required.   PA submitted on 07/21/2021 Key [id number] Status is pending       Lyndel Safe, [place] Patient Advocate Specialist [place] Patient Advocate Team Direct Number: [phone]  Fax: [phone]

## 2021-07-21 NOTE — Telephone Encounter (Signed)
Patient Advocate Encounter  Received notification that the request for prior authorization for Megestrol Acetate 400MG /10ML suspension has been denied due to Drug is not covered by plan.       Lyndel Safe, [place] Patient Advocate Specialist [place] Patient Advocate Team Direct Number: [phone]  Fax: [phone]

## 2021-07-21 NOTE — TOC Benefit Eligibility Note (Signed)
Patient Advocate Encounter  Received notification that the request for prior authorization for Megestrol Acetate 400MG /10ML suspension has been denied due to Drug is not covered by plan.     Lyndel Safe, [place] Patient Advocate Specialist [place] Patient Advocate Team Direct Number: [phone]  Fax: [phone]

## 2021-07-22 DIAGNOSIS — D72829 Elevated white blood cell count, unspecified: Secondary | ICD-10-CM

## 2021-07-22 DIAGNOSIS — E639 Nutritional deficiency, unspecified: Secondary | ICD-10-CM
